# Patient Record
Sex: Female | Born: 1960
Health system: Southern US, Community
[De-identification: ages and names within clinical notes are randomized; demographics above are authoritative.]

## PROBLEM LIST (undated history)

## (undated) DIAGNOSIS — T1491XA Suicide attempt, initial encounter: Secondary | ICD-10-CM

## (undated) DIAGNOSIS — F319 Bipolar disorder, unspecified: Secondary | ICD-10-CM

## (undated) DIAGNOSIS — E559 Vitamin D deficiency, unspecified: Secondary | ICD-10-CM

## (undated) DIAGNOSIS — I10 Essential (primary) hypertension: Secondary | ICD-10-CM

## (undated) DIAGNOSIS — X828XXA Other intentional self-harm by crashing of motor vehicle, initial encounter: Secondary | ICD-10-CM

## (undated) DIAGNOSIS — E88819 Insulin resistance, unspecified: Secondary | ICD-10-CM

## (undated) DIAGNOSIS — E8881 Metabolic syndrome: Secondary | ICD-10-CM

## (undated) DIAGNOSIS — K649 Unspecified hemorrhoids: Secondary | ICD-10-CM

## (undated) HISTORY — DX: Insulin resistance, unspecified: E88.819

## (undated) HISTORY — DX: Essential (primary) hypertension: I10

## (undated) HISTORY — DX: Bipolar disorder, unspecified: F31.9

## (undated) HISTORY — DX: Unspecified hemorrhoids: K64.9

## (undated) HISTORY — DX: Vitamin D deficiency, unspecified: E55.9

## (undated) HISTORY — PX: TONSILLECTOMY AND ADENOIDECTOMY: SUR1326

## (undated) HISTORY — DX: Suicide attempt, initial encounter: T14.91XA

## (undated) HISTORY — DX: Other intentional self-harm by crashing of motor vehicle, initial encounter: X82.8XXA

## (undated) HISTORY — DX: Metabolic syndrome: E88.81

---

## 2005-07-02 ENCOUNTER — Ambulatory Visit: Payer: Self-pay

## 2006-07-11 ENCOUNTER — Other Ambulatory Visit: Admission: RE | Admit: 2006-07-11 | Discharge: 2006-07-11 | Payer: Self-pay | Admitting: Internal Medicine

## 2007-04-13 ENCOUNTER — Ambulatory Visit (HOSPITAL_COMMUNITY): Admission: RE | Admit: 2007-04-13 | Discharge: 2007-04-13 | Payer: Self-pay | Admitting: Internal Medicine

## 2008-04-26 ENCOUNTER — Ambulatory Visit (HOSPITAL_COMMUNITY): Admission: RE | Admit: 2008-04-26 | Discharge: 2008-04-26 | Payer: Self-pay | Admitting: Internal Medicine

## 2008-08-01 ENCOUNTER — Other Ambulatory Visit: Admission: RE | Admit: 2008-08-01 | Discharge: 2008-08-01 | Payer: Self-pay | Admitting: Internal Medicine

## 2009-07-04 ENCOUNTER — Ambulatory Visit (HOSPITAL_COMMUNITY): Admission: RE | Admit: 2009-07-04 | Discharge: 2009-07-04 | Payer: Self-pay | Admitting: Internal Medicine

## 2010-07-27 ENCOUNTER — Other Ambulatory Visit (HOSPITAL_COMMUNITY): Payer: Self-pay | Admitting: Internal Medicine

## 2010-07-27 DIAGNOSIS — Z1231 Encounter for screening mammogram for malignant neoplasm of breast: Secondary | ICD-10-CM

## 2010-08-11 ENCOUNTER — Ambulatory Visit (HOSPITAL_COMMUNITY)
Admission: RE | Admit: 2010-08-11 | Discharge: 2010-08-11 | Disposition: A | Discharge status: Home / Self Care | Payer: BC Managed Care – PPO | Source: Ambulatory Visit | Attending: Internal Medicine | Admitting: Internal Medicine

## 2010-08-11 DIAGNOSIS — Z1231 Encounter for screening mammogram for malignant neoplasm of breast: Secondary | ICD-10-CM

## 2011-07-06 ENCOUNTER — Other Ambulatory Visit (HOSPITAL_COMMUNITY): Payer: Self-pay | Admitting: Internal Medicine

## 2011-07-06 DIAGNOSIS — Z1231 Encounter for screening mammogram for malignant neoplasm of breast: Secondary | ICD-10-CM

## 2011-08-13 ENCOUNTER — Ambulatory Visit (HOSPITAL_COMMUNITY)
Admission: RE | Admit: 2011-08-13 | Discharge: 2011-08-13 | Disposition: A | Discharge status: Home / Self Care | Payer: BC Managed Care – PPO | Source: Ambulatory Visit | Attending: Internal Medicine | Admitting: Internal Medicine

## 2011-08-13 DIAGNOSIS — Z1231 Encounter for screening mammogram for malignant neoplasm of breast: Secondary | ICD-10-CM | POA: Insufficient documentation

## 2011-10-20 ENCOUNTER — Other Ambulatory Visit (HOSPITAL_COMMUNITY)
Admission: RE | Admit: 2011-10-20 | Discharge: 2011-10-20 | Disposition: A | Discharge status: Home / Self Care | Payer: BC Managed Care – PPO | Source: Ambulatory Visit | Attending: Internal Medicine | Admitting: Internal Medicine

## 2011-10-20 DIAGNOSIS — Z1151 Encounter for screening for human papillomavirus (HPV): Secondary | ICD-10-CM | POA: Insufficient documentation

## 2011-10-20 DIAGNOSIS — N760 Acute vaginitis: Secondary | ICD-10-CM | POA: Insufficient documentation

## 2011-10-20 DIAGNOSIS — Z01419 Encounter for gynecological examination (general) (routine) without abnormal findings: Secondary | ICD-10-CM | POA: Insufficient documentation

## 2011-10-28 ENCOUNTER — Encounter: Payer: Self-pay | Admitting: Gastroenterology

## 2011-11-01 ENCOUNTER — Ambulatory Visit: Payer: BC Managed Care – PPO | Admitting: Gastroenterology

## 2011-12-01 ENCOUNTER — Ambulatory Visit: Payer: BC Managed Care – PPO | Admitting: Gastroenterology

## 2011-12-21 ENCOUNTER — Ambulatory Visit: Payer: BC Managed Care – PPO | Admitting: Gastroenterology

## 2012-01-24 ENCOUNTER — Encounter: Payer: Self-pay | Admitting: Gastroenterology

## 2012-01-28 ENCOUNTER — Ambulatory Visit: Payer: BC Managed Care – PPO | Admitting: Gastroenterology

## 2012-03-01 ENCOUNTER — Ambulatory Visit: Payer: BC Managed Care – PPO | Admitting: Gastroenterology

## 2012-08-17 ENCOUNTER — Other Ambulatory Visit (HOSPITAL_COMMUNITY): Payer: Self-pay | Admitting: Physician Assistant

## 2012-08-17 DIAGNOSIS — Z1231 Encounter for screening mammogram for malignant neoplasm of breast: Secondary | ICD-10-CM

## 2012-09-05 ENCOUNTER — Ambulatory Visit (HOSPITAL_COMMUNITY)
Admission: RE | Admit: 2012-09-05 | Discharge: 2012-09-05 | Disposition: A | Discharge status: Home / Self Care | Payer: BC Managed Care – PPO | Source: Ambulatory Visit | Attending: Physician Assistant | Admitting: Physician Assistant

## 2012-09-05 DIAGNOSIS — Z1231 Encounter for screening mammogram for malignant neoplasm of breast: Secondary | ICD-10-CM | POA: Insufficient documentation

## 2013-01-04 ENCOUNTER — Telehealth: Payer: Self-pay | Admitting: Internal Medicine

## 2013-01-04 NOTE — Telephone Encounter (Signed)
[phone] called about an expired rx dated 10-20-11 for lisinopril 10hctz 12.5, please advise pharm if you will approve rx

## 2013-01-04 NOTE — Telephone Encounter (Signed)
Please get me the chart or let me know the last visit. Patient does not have any future appointment in epic. 

## 2013-01-05 MED ORDER — LISINOPRIL-HYDROCHLOROTHIAZIDE 10-12.5 MG PO TABS: 1.0000 | ORAL_TABLET | Freq: Every day | ORAL | Status: DC

## 2013-01-05 NOTE — Telephone Encounter (Signed)
LMOM RX was sent to pharmacy 

## 2013-04-18 ENCOUNTER — Other Ambulatory Visit: Payer: Self-pay | Admitting: Physician Assistant

## 2013-04-18 NOTE — Telephone Encounter (Signed)
Patient needs ov before additional refills

## 2013-05-29 DIAGNOSIS — I10 Essential (primary) hypertension: Secondary | ICD-10-CM | POA: Insufficient documentation

## 2013-05-29 DIAGNOSIS — E8881 Metabolic syndrome: Secondary | ICD-10-CM | POA: Insufficient documentation

## 2013-05-29 DIAGNOSIS — F319 Bipolar disorder, unspecified: Secondary | ICD-10-CM | POA: Insufficient documentation

## 2013-05-29 DIAGNOSIS — E559 Vitamin D deficiency, unspecified: Secondary | ICD-10-CM | POA: Insufficient documentation

## 2013-05-30 ENCOUNTER — Ambulatory Visit (INDEPENDENT_AMBULATORY_CARE_PROVIDER_SITE_OTHER): Payer: BC Managed Care – PPO | Admitting: Physician Assistant

## 2013-05-30 ENCOUNTER — Encounter: Payer: Self-pay | Admitting: Physician Assistant

## 2013-05-30 VITALS — BP 118/82 | HR 68 | Temp 98.2°F | Resp 16 | Wt 195.8 lb

## 2013-05-30 DIAGNOSIS — Z79899 Other long term (current) drug therapy: Secondary | ICD-10-CM

## 2013-05-30 DIAGNOSIS — E559 Vitamin D deficiency, unspecified: Secondary | ICD-10-CM

## 2013-05-30 DIAGNOSIS — E8881 Metabolic syndrome: Secondary | ICD-10-CM

## 2013-05-30 DIAGNOSIS — F319 Bipolar disorder, unspecified: Secondary | ICD-10-CM

## 2013-05-30 DIAGNOSIS — I10 Essential (primary) hypertension: Secondary | ICD-10-CM

## 2013-05-30 LAB — CBC WITH DIFFERENTIAL/PLATELET
Basophils Absolute: 0.1 10*3/uL (ref 0.0–0.1)
Basophils Relative: 1 % (ref 0–1)
EOS PCT: 6 % — AB (ref 0–5)
Eosinophils Absolute: 0.4 10*3/uL (ref 0.0–0.7)
HCT: 35.4 % — ABNORMAL LOW (ref 36.0–46.0)
Hemoglobin: 12.4 g/dL (ref 12.0–15.0)
LYMPHS PCT: 32 % (ref 12–46)
Lymphs Abs: 2.3 10*3/uL (ref 0.7–4.0)
MCH: 29 pg (ref 26.0–34.0)
MCHC: 35 g/dL (ref 30.0–36.0)
MCV: 82.7 fL (ref 78.0–100.0)
MONO ABS: 0.4 10*3/uL (ref 0.1–1.0)
Monocytes Relative: 6 % (ref 3–12)
Neutro Abs: 4 10*3/uL (ref 1.7–7.7)
Neutrophils Relative %: 55 % (ref 43–77)
Platelets: 233 10*3/uL (ref 150–400)
RBC: 4.28 MIL/uL (ref 3.87–5.11)
RDW: 14.7 % (ref 11.5–15.5)
WBC: 7.3 10*3/uL (ref 4.0–10.5)

## 2013-05-30 LAB — HEMOGLOBIN A1C
HEMOGLOBIN A1C: 5.4 % (ref <5.7)
MEAN PLASMA GLUCOSE: 108 mg/dL (ref <117)

## 2013-05-30 MED ORDER — LISINOPRIL-HYDROCHLOROTHIAZIDE 10-12.5 MG PO TABS: 1.0000 | ORAL_TABLET | Freq: Every day | ORAL | Status: DC

## 2013-05-30 NOTE — Patient Instructions (Signed)
Bad carbs also include fruit juice, alcohol, and sweet tea. These are empty calories that do not signal to your brain that you are full.   Please remember the good carbs are still carbs which convert into sugar. So please measure them out no more than 1/2-1 cup of rice, oatmeal, pasta, and beans.  Veggies are however free foods! Pile them on.   I like lean protein at every meal such as chicken, [place]key, pork chops, cottage cheese, etc. Just do not fry these meats and please center your meal around vegetable, the meats should be a side dish.   No all fruit is created equal. Please see the list below, the fruit at the bottom is higher in sugars than the fruit at the top    Carpal Tunnel Syndrome The carpal tunnel is a narrow area located on the palm side of your wrist. The tunnel is formed by the wrist bones and ligaments. Nerves, blood vessels, and tendons pass through the carpal tunnel. Repeated wrist motion or certain diseases may cause swelling within the tunnel. This swelling pinches the main nerve in the wrist (median nerve) and causes the painful hand and arm condition called carpal tunnel syndrome. CAUSES   Repeated wrist motions.  Wrist injuries.  Certain diseases like arthritis, diabetes, alcoholism, hyperthyroidism, and kidney failure.  Obesity.  Pregnancy. SYMPTOMS   A "pins and needles" feeling in your fingers or hand.  Tingling or numbness in your fingers or hand.  An aching feeling in your entire arm.  Wrist pain that goes up your arm to your shoulder.  Pain that goes down into your palm or fingers.  A weak feeling in your hands. DIAGNOSIS  Your caregiver will take your history and perform a physical exam. An electromyography test may be needed. This test measures electrical signals sent out by the muscles. The electrical signals are usually slowed by carpal tunnel syndrome. You may also need X-rays. TREATMENT  Carpal tunnel syndrome may clear up by itself. Your  caregiver may recommend a wrist splint or medicine such as a nonsteroidal anti-inflammatory medicine. Cortisone injections may help. Sometimes, surgery may be needed to free the pinched nerve.  HOME CARE INSTRUCTIONS   Take all medicine as directed by your caregiver. Only take over-the-counter or prescription medicines for pain, discomfort, or fever as directed by your caregiver.  If you were given a splint to keep your wrist from bending, wear it as directed. It is important to wear the splint at night. Wear the splint for as long as you have pain or numbness in your hand, arm, or wrist. This may take 1 to 2 months.  Rest your wrist from any activity that may be causing your pain. If your symptoms are work-related, you may need to talk to your employer about changing to a job that does not require using your wrist.  Put ice on your wrist after long periods of wrist activity.  Put ice in a plastic bag.  Place a towel between your skin and the bag.  Leave the ice on for 15-20 minutes, 03-04 times a day.  Keep all follow-up visits as directed by your caregiver. This includes any orthopedic referrals, physical therapy, and rehabilitation. Any delay in getting necessary care could result in a delay or failure of your condition to heal. SEEK IMMEDIATE MEDICAL CARE IF:   You have new, unexplained symptoms.  Your symptoms get worse and are not helped or controlled with medicines. MAKE SURE YOU:  Understand these instructions.  Will watch your condition.  Will get help right away if you are not doing well or get worse. Document Released: 01/16/2000 Document Revised: 04/12/2011 Document Reviewed: 12/04/2010 [facility] Patient Information 2014 [employer] [place].

## 2013-05-30 NOTE — Progress Notes (Signed)
HPI 53 y.o. female  presents for 3 month follow up with hypertension, hyperlipidemia, prediabetes and vitamin D. Her blood pressure has been controlled at home, today their BP is BP: 118/82 mmHg She does not workout. She denies chest pain, shortness of breath, dizziness.  She is not on cholesterol medication and denies myalgias. Her cholesterol is\ at goal. The cholesterol last visit was: LDL 94  She has been working on diet and exercise for prediabetes, and denies paresthesia of the feet, polydipsia and polyuria. Last A1C in the office was: insulin 44, A1C 5.4 Patient is on Vitamin D supplement.  Vitamin D 63 She states that her wrist pain and the numbness is getting worse. Worse in the AM and worse occ with driving. She also has bilateral elbow pain in the AM.   Current Medications:  Current Outpatient Prescriptions on File Prior to Visit  Medication Sig Dispense Refill  . BABY ASPIRIN PO Take 81 mg by mouth daily.      . Calcium Carbonate (CALCIUM 600 PO) Take 600 mg by mouth daily.      . cholecalciferol (VITAMIN D) 1000 UNITS tablet Take 8,000 Units by mouth daily.       Marland Kitchen. lisinopril-hydrochlorothiazide (PRINZIDE,ZESTORETIC) 10-12.5 MG per tablet TAKE ONE TABLET BY MOUTH ONCE DAILY  30 tablet  0  . lithium 300 MG tablet Take 300 mg by mouth 3 (three) times daily.      . Omega-3 Fatty Acids (FISH OIL PO) Take by mouth daily.       No current facility-administered medications on file prior to visit.   Medical History:  Past Medical History  Diagnosis Date  . Bipolar disorder   . Hemorrhoids   . Hypertension   . Vitamin D deficiency   . Insulin resistance    Allergies:  Allergies  Allergen Reactions  . Prednisolone      Review of Systems: [X]  = complains of  [ ]  = denies  General: Fatigue [ ]  Fever [ ]  Chills [ ]  Weakness [ ]   Insomnia [ ]  Eyes: Redness [ ]  Blurred vision [ ]  Diplopia [ ]   ENT: Congestion [ ]  Sinus Pain [ ]  Post Nasal Drip [ ]  Sore Throat [ ]  Earache [ ]    Cardiac: Chest pain/pressure [ ]  SOB [ ]  Orthopnea [ ]   Palpitations [ ]   Paroxysmal nocturnal dyspnea[ ]  Claudication [ ]  Edema [ ]   Pulmonary: Cough [ ]  Wheezing[ ]   SOB [ ]   Snoring [ ]   GI: Nausea [ ]  Vomiting[ ]  Dysphagia[ ]  Heartburn[ ]  Abdominal pain [ ]  Constipation [ ] ; Diarrhea [ ] ; BRBPR [ ]  Melena[ ]  GU: Hematuria[ ]  Dysuria [ ]  Nocturia[ ]  Urgency [ ]   Hesitancy [ ]  Discharge [ ]  Neuro: Headaches[ ]  Vertigo[ ]  Paresthesias[ ]  Spasm [ ]  Speech changes [ ]  Incoordination [ ]   Ortho: Arthritis [ ]  Joint pain [ ]  Muscle pain [ ]  Joint swelling [ ]  Back Pain [ ]  Skin:  Rash [ ]   Pruritis [ ]  Change in skin lesion [ ]   Psych: Depression[ ]  Anxiety[ ]  Confusion [ ]  Memory loss [ ]   Heme/Lypmh: Bleeding [ ]  Bruising [ ]  Enlarged lymph nodes [ ]   Endocrine: Visual blurring [ ]  Paresthesia [ ]  Polyuria [ ]  Polydypsea [ ]    Heat/cold intolerance [ ]  Hypoglycemia [ ]   Family history- Review and unchanged Social history- Review and unchanged Physical Exam: BP 118/82  Pulse 68  Temp(Src) 98.2 F (36.8 C)  Resp 16  Wt 195 lb 12.8 oz (88.814 kg) Wt Readings from Last 3 Encounters:  05/30/13 195 lb 12.8 oz (88.814 kg)   General Appearance: Well nourished, in no apparent distress. Eyes: PERRLA, EOMs, conjunctiva no swelling or erythema Sinuses: No Frontal/maxillary tenderness ENT/Mouth: Ext aud canals clear, TMs without erythema, bulging. No erythema, swelling, or exudate on post pharynx.  Tonsils not swollen or erythematous. Hearing normal.  Neck: Supple, thyroid normal.  Respiratory: Respiratory effort normal, BS equal bilaterally without rales, rhonchi, wheezing or stridor.  Cardio: RRR with no MRGs. Brisk peripheral pulses without edema.  Abdomen: Soft, + BS.  Non tender, no guarding, rebound, hernias, masses. Lymphatics: Non tender without lymphadenopathy.  Musculoskeletal: Full ROM, 5/5 strength, normal gait. No cervical neck tenderness, good strength and sensation bilateral arms,  good pulses. Skin: Warm, dry without rashes, lesions, ecchymosis.  Neuro: Cranial nerves intact. Normal muscle tone, no cerebellar symptoms. Sensation intact.  Psych: Awake and oriented X 3, normal affect, Insight and Judgment appropriate.   Assessment and Plan:  Hypertension: Continue medication, monitor blood pressure at home. Continue DASH diet. Cholesterol: Continue diet and exercise. Check cholesterol.  Pre-diabetes-Continue diet and exercise. Check A1C Vitamin D Def- check level and continue medications.  ?cervical versus carpel tunnel- get brace and wear at night for 3 weeks and also get cervical neck pillow, if not better will refer to ortho  Continue diet and meds as discussed. Further disposition pending results of labs.  Quentin MullingAmanda Kyshon Tolliver 1:40 PM

## 2013-05-31 LAB — BASIC METABOLIC PANEL WITH GFR
BUN: 19 mg/dL (ref 6–23)
CALCIUM: 9.7 mg/dL (ref 8.4–10.5)
CO2: 26 mEq/L (ref 19–32)
CREATININE: 0.96 mg/dL (ref 0.50–1.10)
Chloride: 103 mEq/L (ref 96–112)
GFR, Est African American: 80 mL/min
GFR, Est Non African American: 69 mL/min
Glucose, Bld: 86 mg/dL (ref 70–99)
POTASSIUM: 4.3 meq/L (ref 3.5–5.3)
Sodium: 137 mEq/L (ref 135–145)

## 2013-05-31 LAB — VITAMIN D 25 HYDROXY (VIT D DEFICIENCY, FRACTURES): Vit D, 25-Hydroxy: 66 ng/mL (ref 30–89)

## 2013-05-31 LAB — INSULIN, FASTING: INSULIN FASTING, SERUM: 31 u[IU]/mL — AB (ref 3–28)

## 2013-05-31 LAB — TSH: TSH: 1.88 u[IU]/mL (ref 0.350–4.500)

## 2013-05-31 LAB — HEPATIC FUNCTION PANEL
ALBUMIN: 4.4 g/dL (ref 3.5–5.2)
ALT: 14 U/L (ref 0–35)
AST: 14 U/L (ref 0–37)
Alkaline Phosphatase: 50 U/L (ref 39–117)
BILIRUBIN DIRECT: 0.1 mg/dL (ref 0.0–0.3)
Indirect Bilirubin: 0.4 mg/dL (ref 0.2–1.2)
TOTAL PROTEIN: 6.7 g/dL (ref 6.0–8.3)
Total Bilirubin: 0.5 mg/dL (ref 0.2–1.2)

## 2013-05-31 LAB — MAGNESIUM: MAGNESIUM: 1.9 mg/dL (ref 1.5–2.5)

## 2013-06-07 ENCOUNTER — Telehealth: Payer: Self-pay | Admitting: *Deleted

## 2013-06-07 ENCOUNTER — Encounter: Payer: Self-pay | Admitting: Physician Assistant

## 2013-06-07 MED ORDER — FLUTICASONE PROPIONATE 50 MCG/ACT NA SUSP: 2.0000 | Freq: Every day | NASAL | Status: DC

## 2013-06-07 NOTE — Telephone Encounter (Signed)
PROPIONATE  Pt asking is this ok for her to use? Says its her husbands & wanting to use med?

## 2013-06-18 ENCOUNTER — Other Ambulatory Visit: Payer: Self-pay | Admitting: Physician Assistant

## 2013-06-18 MED ORDER — BISOPROLOL-HYDROCHLOROTHIAZIDE 5-6.25 MG PO TABS: 1.0000 | ORAL_TABLET | Freq: Every day | ORAL | Status: DC

## 2013-08-13 ENCOUNTER — Other Ambulatory Visit: Payer: Self-pay | Admitting: Internal Medicine

## 2013-08-13 DIAGNOSIS — Z1231 Encounter for screening mammogram for malignant neoplasm of breast: Secondary | ICD-10-CM

## 2013-09-13 ENCOUNTER — Ambulatory Visit (HOSPITAL_COMMUNITY)
Admission: RE | Admit: 2013-09-13 | Discharge: 2013-09-13 | Disposition: A | Discharge status: Home / Self Care | Payer: BC Managed Care – PPO | Source: Ambulatory Visit | Attending: Internal Medicine | Admitting: Internal Medicine

## 2013-09-13 DIAGNOSIS — Z1231 Encounter for screening mammogram for malignant neoplasm of breast: Secondary | ICD-10-CM | POA: Diagnosis present

## 2013-10-29 ENCOUNTER — Encounter: Payer: Self-pay | Admitting: Internal Medicine

## 2013-11-03 ENCOUNTER — Other Ambulatory Visit: Payer: Self-pay | Admitting: Physician Assistant

## 2013-11-03 ENCOUNTER — Other Ambulatory Visit: Payer: Self-pay | Admitting: Internal Medicine

## 2013-11-03 MED ORDER — LISINOPRIL-HYDROCHLOROTHIAZIDE 10-12.5 MG PO TABS: 1.0000 | ORAL_TABLET | Freq: Every day | ORAL | Status: DC

## 2013-11-16 ENCOUNTER — Other Ambulatory Visit: Payer: Self-pay

## 2013-12-10 ENCOUNTER — Encounter: Payer: Self-pay | Admitting: Internal Medicine

## 2013-12-10 ENCOUNTER — Ambulatory Visit (INDEPENDENT_AMBULATORY_CARE_PROVIDER_SITE_OTHER): Payer: BC Managed Care – PPO | Admitting: Internal Medicine

## 2013-12-10 VITALS — BP 112/74 | HR 76 | Temp 98.2°F | Resp 16 | Ht 67.25 in | Wt 194.4 lb

## 2013-12-10 DIAGNOSIS — R6889 Other general symptoms and signs: Secondary | ICD-10-CM

## 2013-12-10 DIAGNOSIS — E8881 Metabolic syndrome: Secondary | ICD-10-CM

## 2013-12-10 DIAGNOSIS — E559 Vitamin D deficiency, unspecified: Secondary | ICD-10-CM

## 2013-12-10 DIAGNOSIS — E782 Mixed hyperlipidemia: Secondary | ICD-10-CM | POA: Insufficient documentation

## 2013-12-10 DIAGNOSIS — R7989 Other specified abnormal findings of blood chemistry: Secondary | ICD-10-CM

## 2013-12-10 DIAGNOSIS — Z79899 Other long term (current) drug therapy: Secondary | ICD-10-CM | POA: Insufficient documentation

## 2013-12-10 DIAGNOSIS — Z23 Encounter for immunization: Secondary | ICD-10-CM

## 2013-12-10 DIAGNOSIS — Z113 Encounter for screening for infections with a predominantly sexual mode of transmission: Secondary | ICD-10-CM

## 2013-12-10 DIAGNOSIS — Z0001 Encounter for general adult medical examination with abnormal findings: Secondary | ICD-10-CM

## 2013-12-10 DIAGNOSIS — Z111 Encounter for screening for respiratory tuberculosis: Secondary | ICD-10-CM

## 2013-12-10 DIAGNOSIS — I10 Essential (primary) hypertension: Secondary | ICD-10-CM

## 2013-12-10 DIAGNOSIS — Z1212 Encounter for screening for malignant neoplasm of rectum: Secondary | ICD-10-CM

## 2013-12-10 DIAGNOSIS — R945 Abnormal results of liver function studies: Secondary | ICD-10-CM

## 2013-12-10 LAB — CBC WITH DIFFERENTIAL/PLATELET
Basophils Absolute: 0.1 10*3/uL (ref 0.0–0.1)
Basophils Relative: 1 % (ref 0–1)
EOS ABS: 0.4 10*3/uL (ref 0.0–0.7)
EOS PCT: 7 % — AB (ref 0–5)
HEMATOCRIT: 37.2 % (ref 36.0–46.0)
HEMOGLOBIN: 12.3 g/dL (ref 12.0–15.0)
LYMPHS ABS: 1.4 10*3/uL (ref 0.7–4.0)
LYMPHS PCT: 26 % (ref 12–46)
MCH: 27.8 pg (ref 26.0–34.0)
MCHC: 33.1 g/dL (ref 30.0–36.0)
MCV: 84 fL (ref 78.0–100.0)
MONO ABS: 0.4 10*3/uL (ref 0.1–1.0)
MONOS PCT: 7 % (ref 3–12)
Neutro Abs: 3.2 10*3/uL (ref 1.7–7.7)
Neutrophils Relative %: 59 % (ref 43–77)
Platelets: 207 10*3/uL (ref 150–400)
RBC: 4.43 MIL/uL (ref 3.87–5.11)
RDW: 14.4 % (ref 11.5–15.5)
WBC: 5.4 10*3/uL (ref 4.0–10.5)

## 2013-12-10 LAB — HEMOGLOBIN A1C
Hgb A1c MFr Bld: 5.3 % (ref <5.7)
Mean Plasma Glucose: 105 mg/dL (ref <117)

## 2013-12-10 NOTE — Progress Notes (Signed)
Patient ID: Tanya Tucker, female   DOB: 12-21-1960, 53 y.o.   MRN: [id number]  Annual Screening Comprehensive Examination  This very nice 52 y.o.MWF presents for complete physical.  Patient has been followed for HTN,  Prediabetes, Hyperlipidemia, and Vitamin D Deficiency. Patienty has a bipolar disorder predating since 1994 and [facility] Dr Tomasa Randunningham and apparently is stable on current medicine regimen.    Patient has mild labile HTN predates since 2010 and take her meds infrequently. Patient's BP has been controlled at home and patient denies any cardiac symptoms as chest pain, palpitations, shortness of breath, dizziness or ankle swelling. Today's BP: 112/74 mmHg    Patient's hyperlipidemia is controlled with diet. Patient denies myalgias or other medication SE's. Last lipids were at goal -  T. Chol 174,  HDL 48 and LDL 94 all at goal except a slightly elevatedTG 160.    Patient has  Prediabetes/Insulin Resistance  predating since Sept 2014 with a normal A1c 5.4% and elevated insulin 42.    Patient denies reactive hypoglycemic symptoms, visual blurring, diabetic polys, or paresthesias. Last A1c was  5.4% on 05/30/2013.   Finally, patient has history of Vitamin D Deficiency (19 in 2010) and last Vitamin D was  66 on 05/30/2013.  Medication Sig  . BABY ASPIRIN  Take 81 mg  daily.  . bisoprolol-hctz 5-6.25  Take 1 tablet  daily.  . Calcium Carbonate  600 mg Take 600 mg  daily.  Marland Kitchen. VITAMIN D 1000 UNITS Take 8,000 Units  daily.   . fluticasone nasal spray Place 2 sprays into both nostrils daily.  Marland Kitchen. lisinopril-hctz 10-12.5 MG   take 1 tablet daily for  BP (takes prn)  . lithium 300 MG tablet Take 300 mg 3  times daily.  Marland Kitchen. FISH OIL PO Take  daily.   Allergies  Allergen Reactions  . Prednisolone    Past Medical History  Diagnosis Date  . Bipolar disorder   . Hemorrhoids   . Hypertension   . Vitamin D deficiency   . Insulin resistance    Health  Maintenance  Topic Date Due  . PAP SMEAR  12/18/1980  . COLONOSCOPY  12/18/2012  . INFLUENZA VACCINE  09/01/2013  . MAMMOGRAM  09/14/2015  . TETANUS/TDAP  07/18/2017   Immunization History  Administered Date(s) Administered  . Influenza Split 10/24/2012, 12/10/2013  . PPD Test 12/10/2013  . Pneumococcal Polysaccharide-23 10/24/2012  . Tdap 07/19/2007   Past Surgical History  Procedure Laterality Date  . Tonsillectomy and adenoidectomy     Family History  Problem Relation Age of Onset  . Cataracts Mother   . Osteoporosis Mother   . Heart disease Father   . Hypertension Father   . Diabetes Father    History  Substance Use Topics  . Smoking status: Never Smoker   . Smokeless tobacco: Not on file  . Alcohol Use: Yes     Comment: "a few times a week"    ROS Constitutional: Denies fever, chills, weight loss/gain, headaches, insomnia, fatigue, night sweats, and change in appetite. Eyes: Denies redness, blurred vision, diplopia, discharge, itchy, watery eyes.  ENT: Denies discharge, congestion, post nasal drip, epistaxis, sore throat, earache, hearing loss, dental pain, Tinnitus, Vertigo, Sinus pain, snoring.  Cardio: Denies chest pain, palpitations, irregular heartbeat, syncope, dyspnea, diaphoresis, orthopnea, PND, claudication, edema Respiratory: denies cough, dyspnea, DOE, pleurisy, hoarseness, laryngitis, wheezing.  Gastrointestinal: Denies dysphagia, heartburn, reflux, water brash, pain, cramps, nausea, vomiting, bloating, diarrhea, constipation, hematemesis,  melena, hematochezia, jaundice, hemorrhoids Genitourinary: Denies dysuria, frequency, urgency, nocturia, hesitancy, discharge, hematuria, flank pain Breast: Breast lumps, nipple discharge, bleeding.  Musculoskeletal: Denies arthralgia, myalgia, stiffness, Jt. Swelling, pain, limp, and strain/sprain. Denies falls. Skin: Denies puritis, rash, hives, warts, acne, eczema, changing in skin lesion Neuro: No weakness, tremor,  incoordination, spasms, paresthesia, pain Psychiatric: Denies confusion, memory loss, sensory loss. Denies Depression. Endocrine: Denies change in weight, skin, hair change, nocturia, and paresthesia, diabetic polys, visual blurring, hyper / hypo glycemic episodes.  Heme/Lymph: No excessive bleeding, bruising, enlarged lymph nodes.  Physical Exam  BP 112/74   Pulse 76  Temp 98.2 F   Resp 16  Ht 5' 7.25" Wt 194 lb 6.4 oz   BMI 30.23  General Appearance: Well nourished and in no apparent distress. Eyes: PERRLA, EOMs, conjunctiva no swelling or erythema, normal fundi and vessels. Sinuses: No frontal/maxillary tenderness ENT/Mouth: EACs patent / TMs  nl. Nares clear without erythema, swelling, mucoid exudates. Oral hygiene is good. No erythema, swelling, or exudate. Tongue normal, non-obstructing. Tonsils not swollen or erythematous. Hearing normal.  Neck: Supple, thyroid normal. No bruits, nodes or JVD. Respiratory: Respiratory effort normal.  BS equal and clear bilateral without rales, rhonci, wheezing or stridor. Cardio: Heart sounds are normal with regular rate and rhythm and no murmurs, rubs or gallops. Peripheral pulses are normal and equal bilaterally without edema. No aortic or femoral bruits. Chest: symmetric with normal excursions and percussion. Breasts: Symmetric, without lumps, nipple discharge, retractions, or fibrocystic changes.  Abdomen: Flat, soft, with bowl sounds. Nontender, no guarding, rebound, hernias, masses, or organomegaly.  Lymphatics: Non tender without lymphadenopathy.  Genitourinary:  Musculoskeletal: Full ROM all peripheral extremities, joint stability, 5/5 strength, and normal gait. Skin: Warm and dry without rashes, lesions, cyanosis, clubbing or  ecchymosis.  Neuro: Cranial nerves intact, reflexes equal bilaterally. Normal muscle tone, no cerebellar symptoms. Sensation intact.  Pysch: Awake and oriented X 3, normal affect, Insight and Judgment appropriate.    Assessment and Plan  1. Annual Screening Examination 2. Hypertension  3. Hyperlipidemia 4. Pre Diabetes 5. Vitamin D Deficiency   Continue prudent diet as discussed, weight control, BP monitoring, regular exercise, and medications. Discussed med's effects and SE's. Screening labs and tests as requested with regular follow-up as recommended.

## 2013-12-10 NOTE — Patient Instructions (Signed)
Recommend the book "The END of DIETING" by Dr Baker Janus   and the book "The END of DIABETES " by Dr Excell Seltzer  At [email or web address] - get book & Audio CD's      Being diabetic has a  300% increased risk for heart attack, stroke, cancer, and alzheimer- type vascular dementia. It is very important that you work harder with diet by avoiding all foods that are white except chicken & fish. Avoid white rice (brown & wild rice is OK), white potatoes (sweetpotatoes in moderation is OK), White bread or wheat bread or anything made out of white flour like bagels, donuts, rolls, buns, biscuits, cakes, pastries, cookies, pizza crust, and pasta (made from white flour & egg whites) - vegetarian pasta or spinach or wheat pasta is OK. Multigrain breads like Arnold's or [employer], or multigrain sandwich thins or flatbreads.  Diet, exercise and weight loss can reverse and cure diabetes in the early stages.  Diet, exercise and weight loss is very important in the control and prevention of complications of diabetes which affects every system in your body, ie. Brain - dementia/stroke, eyes - glaucoma/blindness, heart - heart attack/heart failure, kidneys - dialysis, stomach - gastric paralysis, intestines - malabsorption, nerves - severe painful neuritis, circulation - gangrene & loss of a leg(s), and finally cancer and Alzheimers.    I recommend avoid fried & greasy foods,  sweets/candy, white rice (brown or wild rice or Quinoa is OK), white potatoes (sweet potatoes are OK) - anything made from white flour - bagels, doughnuts, rolls, buns, biscuits,white and wheat breads, pizza crust and traditional pasta made of white flour & egg white(vegetarian pasta or spinach or wheat pasta is OK).  Multi-grain bread is OK - like multi-grain flat bread or sandwich thins. Avoid alcohol in excess. Exercise is also important.    Eat all the vegetables you want - avoid meat, especially red meat and dairy - especially cheese.  Cheese  is the most concentrated form of trans-fats which is the worst thing to clog up our arteries. Veggie cheese is OK which can be found in the fresh produce section at [employer] or [employer] or [employer]  Preventive Care for Adults A healthy lifestyle and preventive care can promote health and wellness. Preventive health guidelines for women include the following key practices.  A routine yearly physical is a good way to check with your health care provider about your health and preventive screening. It is a chance to share any concerns and updates on your health and to receive a thorough exam.  Visit your dentist for a routine exam and preventive care every 6 months. Brush your teeth twice a day and floss once a day. Good oral hygiene prevents tooth decay and gum disease.  The frequency of eye exams is based on your age, health, family medical history, use of contact lenses, and other factors. Follow your health care provider's recommendations for frequency of eye exams.  Eat a healthy diet. Foods like vegetables, fruits, whole grains, low-fat dairy products, and lean protein foods contain the nutrients you need without too many calories. Decrease your intake of foods high in solid fats, added sugars, and salt. Eat the right amount of calories for you.Get information about a proper diet from your health care provider, if necessary.  Regular physical exercise is one of the most important things you can do for your health. Most adults should get at least 150 minutes of moderate-intensity exercise (any activity that increases  your heart rate and causes you to sweat) each week. In addition, most adults need muscle-strengthening exercises on 2 or more days a week.  Maintain a healthy weight. The body mass index (BMI) is a screening tool to identify possible weight problems. It provides an estimate of body fat based on height and weight. Your health care provider can find your BMI and can help you  achieve or maintain a healthy weight.For adults 20 years and older:  A BMI below 18.5 is considered underweight.  A BMI of 18.5 to 24.9 is normal.  A BMI of 25 to 29.9 is considered overweight.  A BMI of 30 and above is considered obese.  Maintain normal blood lipids and cholesterol levels by exercising and minimizing your intake of saturated fat. Eat a balanced diet with plenty of fruit and vegetables. Blood tests for lipids and cholesterol should begin at age 38 and be repeated every 5 years. If your lipid or cholesterol levels are high, you are over 50, or you are at high risk for heart disease, you may need your cholesterol levels checked more frequently.Ongoing high lipid and cholesterol levels should be treated with medicines if diet and exercise are not working.  If you smoke, find out from your health care provider how to quit. If you do not use tobacco, do not start.  Lung cancer screening is recommended for adults aged 64-80 years who are at high risk for developing lung cancer because of a history of smoking. A yearly low-dose CT scan of the lungs is recommended for people who have at least a 30-pack-year history of smoking and are a current smoker or have quit within the past 15 years. A pack year of smoking is smoking an average of 1 pack of cigarettes a day for 1 year (for example: 1 pack a day for 30 years or 2 packs a day for 15 years). Yearly screening should continue until the smoker has stopped smoking for at least 15 years. Yearly screening should be stopped for people who develop a health problem that would prevent them from having lung cancer treatment.  High blood pressure causes heart disease and increases the risk of stroke. Your blood pressure should be checked at least every 1 to 2 years. Ongoing high blood pressure should be treated with medicines if weight loss and exercise do not work.  If you are 79-57 years old, ask your health care provider if you should take  aspirin to prevent strokes.  Diabetes screening involves taking a blood sample to check your fasting blood sugar level. This should be done once every 3 years, after age 37, if you are within normal weight and without risk factors for diabetes. Testing should be considered at a younger age or be carried out more frequently if you are overweight and have at least 1 risk factor for diabetes.  Breast cancer screening is essential preventive care for women. You should practice "breast self-awareness." This means understanding the normal appearance and feel of your breasts and may include breast self-examination. Any changes detected, no matter how small, should be reported to a health care provider. Women in their 76s and 30s should have a clinical breast exam (CBE) by a health care provider as part of a regular health exam every 1 to 3 years. After age 55, women should have a CBE every year. Starting at age 79, women should consider having a mammogram (breast X-ray test) every year. Women who have a family history of breast  cancer should talk to their health care provider about genetic screening. Women at a high risk of breast cancer should talk to their health care providers about having an MRI and a mammogram every year.  Breast cancer gene (BRCA)-related cancer risk assessment is recommended for women who have family members with BRCA-related cancers. BRCA-related cancers include breast, ovarian, tubal, and peritoneal cancers. Having family members with these cancers may be associated with an increased risk for harmful changes (mutations) in the breast cancer genes BRCA1 and BRCA2. Results of the assessment will determine the need for genetic counseling and BRCA1 and BRCA2 testing.  Routine pelvic exams to screen for cancer are no longer recommended for nonpregnant women who are considered low risk for cancer of the pelvic organs (ovaries, uterus, and vagina) and who do not have symptoms. Ask your health  care provider if a screening pelvic exam is right for you.  If you have had past treatment for cervical cancer or a condition that could lead to cancer, you need Pap tests and screening for cancer for at least 20 years after your treatment. If Pap tests have been discontinued, your risk factors (such as having a new sexual partner) need to be reassessed to determine if screening should be resumed. Some women have medical problems that increase the chance of getting cervical cancer. In these cases, your health care provider may recommend more frequent screening and Pap tests.  Colorectal cancer can be detected and often prevented. Most routine colorectal cancer screening begins at the age of 58 years and continues through age 44 years. However, your health care provider may recommend screening at an earlier age if you have risk factors for colon cancer. On a yearly basis, your health care provider may provide home test kits to check for hidden blood in the stool. Use of a small camera at the end of a tube, to directly examine the colon (sigmoidoscopy or colonoscopy), can detect the earliest forms of colorectal cancer. Talk to your health care provider about this at age 84, when routine screening begins. Direct exam of the colon should be repeated every 5-10 years through age 33 years, unless early forms of pre-cancerous polyps or small growths are found.  Hepatitis C blood testing is recommended for all people born from [birth year] through 1965 and any individual with known risks for hepatitis C.  Pra  Osteoporosis is a disease in which the bones lose minerals and strength with aging. This can result in serious bone fractures or breaks. The risk of osteoporosis can be identified using a bone density scan. Women ages 36 years and over and women at risk for fractures or osteoporosis should discuss screening with their health care providers. Ask your health care provider whether you should take a calcium supplement  or vitamin D to reduce the rate of osteoporosis.  Menopause can be associated with physical symptoms and risks. Hormone replacement therapy is available to decrease symptoms and risks. You should talk to your health care provider about whether hormone replacement therapy is right for you.  Use sunscreen. Apply sunscreen liberally and repeatedly throughout the day. You should seek shade when your shadow is shorter than you. Protect yourself by wearing long sleeves, pants, a wide-brimmed hat, and sunglasses year round, whenever you are outdoors.  Once a month, do a whole body skin exam, using a mirror to look at the skin on your back. Tell your health care provider of new moles, moles that have irregular borders, moles that are  larger than a pencil eraser, or moles that have changed in shape or color.  Stay current with required vaccines (immunizations).  Influenza vaccine. All adults should be immunized every year.  Tetanus, diphtheria, and acellular pertussis (Td, Tdap) vaccine. Pregnant women should receive 1 dose of Tdap vaccine during each pregnancy. The dose should be obtained regardless of the length of time since the last dose. Immunization is preferred during the 27th-36th week of gestation. An adult who has not previously received Tdap or who does not know her vaccine status should receive 1 dose of Tdap. This initial dose should be followed by tetanus and diphtheria toxoids (Td) booster doses every 10 years. Adults with an unknown or incomplete history of completing a 3-dose immunization series with Td-containing vaccines should begin or complete a primary immunization series including a Tdap dose. Adults should receive a Td booster every 10 years.  Zoster vaccine. One dose is recommended for adults aged 65 years or older unless certain conditions are present.  Measles, mumps, and rubella (MMR) vaccine. Adults born before 45 generally are considered immune to measles and mumps. Adults born  in [birth year] or later should have 1 or more doses of MMR vaccine unless there is a contraindication to the vaccine or there is laboratory evidence of immunity to each of the three diseases. A routine second dose of MMR vaccine should be obtained at least 28 days after the first dose for students attending postsecondary schools, health care workers, or international travelers. People who received inactivated measles vaccine or an unknown type of measles vaccine during 1963-1967 should receive 2 doses of MMR vaccine. People who received inactivated mumps vaccine or an unknown type of mumps vaccine before 1979 and are at high risk for mumps infection should consider immunization with 2 doses of MMR vaccine. For females of childbearing age, rubella immunity should be determined. If there is no evidence of immunity, females who are not pregnant should be vaccinated. If there is no evidence of immunity, females who are pregnant should delay immunization until after pregnancy. Unvaccinated health care workers born before [birth year] who lack laboratory evidence of measles, mumps, or rubella immunity or laboratory confirmation of disease should consider measles and mumps immunization with 2 doses of MMR vaccine or rubella immunization with 1 dose of MMR vaccine.  Pneumococcal 13-valent conjugate (PCV13) vaccine. When indicated, a person who is uncertain of her immunization history and has no record of immunization should receive the PCV13 vaccine. An adult aged 35 years or older who has certain medical conditions and has not been previously immunized should receive 1 dose of PCV13 vaccine. This PCV13 should be followed with a dose of pneumococcal polysaccharide (PPSV23) vaccine. The PPSV23 vaccine dose should be obtained at least 8 weeks after the dose of PCV13 vaccine. An adult aged 56 years or older who has certain medical conditions and previously received 1 or more doses of PPSV23 vaccine should receive 1 dose of PCV13. The PCV13  vaccine dose should be obtained 1 or more years after the last PPSV23 vaccine dose.    Pneumococcal polysaccharide (PPSV23) vaccine. When PCV13 is also indicated, PCV13 should be obtained first. All adults aged 62 years and older should be immunized. An adult younger than age 65 years who has certain medical conditions should be immunized. Any person who resides in a nursing home or long-term care facility should be immunized. An adult smoker should be immunized. People with an immunocompromised condition and certain other conditions should receive both PCV13  and PPSV23 vaccines. People with human immunodeficiency virus (HIV) infection should be immunized as soon as possible after diagnosis. Immunization during chemotherapy or radiation therapy should be avoided. Routine use of PPSV23 vaccine is not recommended for American Indians, [place] Natives, or people younger than 65 years unless there are medical conditions that require PPSV23 vaccine. When indicated, people who have unknown immunization and have no record of immunization should receive PPSV23 vaccine. One-time revaccination 5 years after the first dose of PPSV23 is recommended for people aged 19-64 years who have chronic kidney failure, nephrotic syndrome, asplenia, or immunocompromised conditions. People who received 1-2 doses of PPSV23 before age 20 years should receive another dose of PPSV23 vaccine at age 61 years or later if at least 5 years have passed since the previous dose. Doses of PPSV23 are not needed for people immunized with PPSV23 at or after age 44 years.  Preventive Services / Frequency   Ages 37 to 91 years  Blood pressure check.  Lipid and cholesterol check.  Lung cancer screening. / Every year if you are aged 29-80 years and have a 30-pack-year history of smoking and currently smoke or have quit within the past 15 years. Yearly screening is stopped once you have quit smoking for at least 15 years or develop a health  problem that would prevent you from having lung cancer treatment.  Clinical breast exam.** / Every year after age 76 years.  BRCA-related cancer risk assessment.** / For women who have family members with a BRCA-related cancer (breast, ovarian, tubal, or peritoneal cancers).  Mammogram.** / Every year beginning at age 23 years and continuing for as long as you are in good health. Consult with your health care provider.  Pap test.** / Every 3 years starting at age 62 years through age 86 or 75 years with a history of 3 consecutive normal Pap tests.  HPV screening.** / Every 3 years from ages 22 years through ages 16 to 40 years with a history of 3 consecutive normal Pap tests.  Fecal occult blood test (FOBT) of stool. / Every year beginning at age 20 years and continuing until age 6 years. You may not need to do this test if you get a colonoscopy every 10 years.  Flexible sigmoidoscopy or colonoscopy.** / Every 5 years for a flexible sigmoidoscopy or every 10 years for a colonoscopy beginning at age 19 years and continuing until age 13 years.  Hepatitis C blood test.** / For all people born from [birth year] through 1965 and any individual with known risks for hepatitis C.  Skin self-exam. / Monthly.  Influenza vaccine. / Every year.  Tetanus, diphtheria, and acellular pertussis (Tdap/Td) vaccine.** / Consult your health care provider. Pregnant women should receive 1 dose of Tdap vaccine during each pregnancy. 1 dose of Td every 10 years.  Zoster vaccine.** / 1 dose for adults aged 92 years or older.  Pneumococcal 13-valent conjugate (PCV13) vaccine.** / Consult your health care provider.  Pneumococcal polysaccharide (PPSV23) vaccine.** / 1 to 2 doses if you smoke cigarettes or if you have certain conditions.

## 2013-12-11 LAB — URINALYSIS, MICROSCOPIC ONLY
Bacteria, UA: NONE SEEN
CASTS: NONE SEEN
CRYSTALS: NONE SEEN
SQUAMOUS EPITHELIAL / LPF: NONE SEEN

## 2013-12-11 LAB — TSH: TSH: 1.247 u[IU]/mL (ref 0.350–4.500)

## 2013-12-11 LAB — HEPATIC FUNCTION PANEL
ALBUMIN: 4.1 g/dL (ref 3.5–5.2)
ALT: 17 U/L (ref 0–35)
AST: 16 U/L (ref 0–37)
Alkaline Phosphatase: 47 U/L (ref 39–117)
BILIRUBIN TOTAL: 0.6 mg/dL (ref 0.2–1.2)
Bilirubin, Direct: 0.1 mg/dL (ref 0.0–0.3)
Indirect Bilirubin: 0.5 mg/dL (ref 0.2–1.2)
Total Protein: 6.3 g/dL (ref 6.0–8.3)

## 2013-12-11 LAB — MAGNESIUM: MAGNESIUM: 1.8 mg/dL (ref 1.5–2.5)

## 2013-12-11 LAB — IRON AND TIBC
%SAT: 22 % (ref 20–55)
Iron: 86 ug/dL (ref 42–145)
TIBC: 392 ug/dL (ref 250–470)
UIBC: 306 ug/dL (ref 125–400)

## 2013-12-11 LAB — MICROALBUMIN / CREATININE URINE RATIO
Creatinine, Urine: 99.5 mg/dL
Microalb Creat Ratio: 4 mg/g (ref 0.0–30.0)
Microalb, Ur: 0.4 mg/dL (ref <2.0)

## 2013-12-11 LAB — VITAMIN D 25 HYDROXY (VIT D DEFICIENCY, FRACTURES): VIT D 25 HYDROXY: 63 ng/mL (ref 30–89)

## 2013-12-11 LAB — BASIC METABOLIC PANEL WITH GFR
BUN: 21 mg/dL (ref 6–23)
CALCIUM: 9.7 mg/dL (ref 8.4–10.5)
CHLORIDE: 107 meq/L (ref 96–112)
CO2: 25 mEq/L (ref 19–32)
CREATININE: 0.72 mg/dL (ref 0.50–1.10)
GFR, Est African American: >89 mL/min
GFR, Est Non African American: >89 mL/min
GLUCOSE: 83 mg/dL (ref 70–99)
Potassium: 4.3 mEq/L (ref 3.5–5.3)
Sodium: 141 mEq/L (ref 135–145)

## 2013-12-11 LAB — LIPID PANEL
CHOL/HDL RATIO: 4.8 ratio
Cholesterol: 216 mg/dL — ABNORMAL HIGH (ref 0–200)
HDL: 45 mg/dL (ref >39)
LDL CALC: 134 mg/dL — AB (ref 0–99)
Triglycerides: 185 mg/dL — ABNORMAL HIGH (ref <150)
VLDL: 37 mg/dL (ref 0–40)

## 2013-12-11 LAB — INSULIN, FASTING: INSULIN FASTING, SERUM: 45.2 u[IU]/mL — AB (ref 2.0–19.6)

## 2013-12-11 LAB — VITAMIN B12: Vitamin B-12: 637 pg/mL (ref 211–911)

## 2013-12-19 ENCOUNTER — Other Ambulatory Visit: Payer: Self-pay

## 2013-12-19 MED ORDER — LISINOPRIL-HYDROCHLOROTHIAZIDE 10-12.5 MG PO TABS: 1.0000 | ORAL_TABLET | Freq: Every day | ORAL | Status: DC

## 2013-12-20 ENCOUNTER — Other Ambulatory Visit: Payer: Self-pay | Admitting: Physician Assistant

## 2014-02-07 ENCOUNTER — Other Ambulatory Visit: Payer: Self-pay | Admitting: Internal Medicine

## 2014-02-20 ENCOUNTER — Ambulatory Visit (INDEPENDENT_AMBULATORY_CARE_PROVIDER_SITE_OTHER): Payer: BLUE CROSS/BLUE SHIELD | Admitting: Physician Assistant

## 2014-02-20 ENCOUNTER — Ambulatory Visit (HOSPITAL_COMMUNITY)
Admission: RE | Admit: 2014-02-20 | Discharge: 2014-02-20 | Disposition: A | Discharge status: Home / Self Care | Payer: BLUE CROSS/BLUE SHIELD | Source: Ambulatory Visit | Attending: Physician Assistant | Admitting: Physician Assistant

## 2014-02-20 ENCOUNTER — Encounter: Payer: Self-pay | Admitting: Physician Assistant

## 2014-02-20 VITALS — BP 160/88 | HR 106 | Temp 102.2°F | Resp 18 | Ht 67.0 in | Wt 188.0 lb

## 2014-02-20 DIAGNOSIS — R059 Cough, unspecified: Secondary | ICD-10-CM

## 2014-02-20 DIAGNOSIS — R05 Cough: Secondary | ICD-10-CM

## 2014-02-20 DIAGNOSIS — R0989 Other specified symptoms and signs involving the circulatory and respiratory systems: Secondary | ICD-10-CM | POA: Insufficient documentation

## 2014-02-20 DIAGNOSIS — J9811 Atelectasis: Secondary | ICD-10-CM | POA: Diagnosis not present

## 2014-02-20 DIAGNOSIS — J209 Acute bronchitis, unspecified: Secondary | ICD-10-CM

## 2014-02-20 MED ORDER — BENZONATATE 100 MG PO CAPS: 200.0000 mg | ORAL_CAPSULE | Freq: Three times a day (TID) | ORAL | Status: DC | PRN

## 2014-02-20 MED ORDER — AZITHROMYCIN 250 MG PO TABS: ORAL_TABLET | ORAL | Status: AC

## 2014-02-20 MED ORDER — IPRATROPIUM-ALBUTEROL 0.5-2.5 (3) MG/3ML IN SOLN
3.0000 mL | Freq: Once | RESPIRATORY_TRACT | Status: AC
  Administered 2014-02-20: 3 mL via RESPIRATORY_TRACT

## 2014-02-20 MED ORDER — ALBUTEROL SULFATE 108 (90 BASE) MCG/ACT IN AEPB: 2.0000 | INHALATION_SPRAY | Freq: Four times a day (QID) | RESPIRATORY_TRACT | Status: DC | PRN

## 2014-02-20 NOTE — Progress Notes (Signed)
HPI  A Caucasian 54 y.o.female presents to the office today due to fever, cough and wheezing for 1 week.  Her sxs are getting worse. Had fever at home (Max 101) last night.  Sick contacts at work.  Has tried Tussin DM and Ibuprofen OTC with mild relief.  Never smoked.  She reports sweats, fatigue, congestion, left ear pain, rhinorrhea, post-nasal drip, sore throat, cough with thick clear mucus, SOB, wheezing, CP with cough only, nausea and headache.  She denies sinus pressure, trouble swallowing, voice change, abdominal pain, vomiting, diarrhea, constipation, dizziness and lightheadedness.  Review of Systems  All other systems reviewed and are negative.  Past Medical History-  Past Medical History  Diagnosis Date  . Bipolar disorder   . Hemorrhoids   . Hypertension   . Vitamin D deficiency   . Insulin resistance    Medications-  Current Outpatient Prescriptions on File Prior to Visit  Medication Sig Dispense Refill  . BABY ASPIRIN PO Take 81 mg by mouth 2 (two) times daily.     . bisoprolol-hydrochlorothiazide (ZIAC) 5-6.25 MG per tablet TAKE ONE TABLET BY MOUTH ONCE DAILY 30 tablet 2  . Calcium Carbonate (CALCIUM 600 PO) Take 600 mg by mouth daily.    . cholecalciferol (VITAMIN D) 1000 UNITS tablet Take 10,000 Units by mouth daily.     . fluticasone (FLONASE) 50 MCG/ACT nasal spray Place 2 sprays into both nostrils daily. 16 g 2  . lisinopril-hydrochlorothiazide (PRINZIDE,ZESTORETIC) 10-12.5 MG per tablet Take 1 tablet by mouth daily. 90 tablet 1  . lithium carbonate 300 MG capsule Takes 1-2 pills daily  0  . Omega-3 Fatty Acids (FISH OIL PO) Take by mouth daily.     No current facility-administered medications on file prior to visit.   Allergies-  Allergies  Allergen Reactions  . Prednisolone    Physical Exam BP 160/88 mmHg  Pulse 106  Temp(Src) 102.2 F (39 C) (Temporal)  Resp 18  Ht  (1.702 m)  Wt 188 lb (85.276 kg)  BMI 29.44 kg/m2  SpO2 97%  LMP  02/19/2014 Wt Readings from Last 3 Encounters:  02/20/14 188 lb (85.276 kg)  12/10/13 194 lb 6.4 oz (88.179 kg)  05/30/13 195 lb 12.8 oz (88.814 kg)  Vitals Reviewed. General Appearance: Well nourished, in no apparent distress and had pleasant demeanor. Eyes:  PERRLA. EOMI. Conjunctiva is pink without edema, erythema or yellowing.  No scleral icterus. Sinuses: No Frontal/maxillary tenderness Ears: No erythema, edema or tenderness on both external ear cartilages and ear canals.  TMs are intact bilaterally with normal light reflexes and without erythema, edema or bulging. Nose: Nose is symmetrical and turbinates are erythematous and non-edematous bilaterally.  No polyps or paleness.  Rhinorrhea present. Throat: Oral pharynx is pink and moist. Erythematous and non-edematous posterior pharynx.  Mucosa is intact and without lesions. Uvula is midline and not swollen. Neck: Supple,  LAD, trachea is midline. Full range of motion in neck intact Respiratory: Wheezing in all lung fields and decreased breath sounds in right lung.  No rales, rhonchi or stridor.  No increased effort of breathing. Cardio: RRR.   m/r/g.  S1S2nl.   Abdomen: Symmetrical, soft, nontender, and rounded.  +BS nl x4.   Extremities:  C/C/E in upper and lower extremities. Pulses B/L +2  Skin: Feverish skin to touch, dry, intact without rashes, lesions, ecchymosis, yellowing, cyanosis.   Neuro: Alert and oriented X3, cooperative.  Mood and affect appropriate to situation.  CN II-XII grossly intact.  Psych: Insight and Judgment appropriate.   Assessment and Plan 1. Acute bronchitis, unspecified organism - azithromycin (ZITHROMAX) 250 MG tablet; Take 2 tablets PO on day 1, then 1 tablet PO Q24H x 4 days  Dispense: 6 tablet; Refill: 1 - benzonatate (TESSALON PERLES) 100 MG capsule; Take 2 capsules (200 mg total) by mouth 3 (three) times daily as needed for cough (Max: 600mg  per day (6 capsules per day)).  Dispense: 120 capsule;  Refill: 0 - Albuterol Sulfate (PROAIR RESPICLICK) 108 (90 BASE) MCG/ACT AEPB; Inhale 2 puffs into the lungs every 6 (six) hours as needed.  Dispense: 1 each; Refill: 1 -Gave DuoNeb in office- ipratropium-albuterol (DUONEB) 0.5-2.5 (3) MG/3ML nebulizer solution 3 mL; Take 3 mLs by nebulization once. Tylenol or Ibuprofen for fever- follow directions on bottle.  2. Cough Ordered chest x-ray to R/O Pneumonia. - DG Chest 2 View; Future  Discussed medication effects and SE's.  Pt agreed to treatment plan. If you are not feeling better in 10-14 days, then please call the office. Please keep your follow up appt on 03/18/14.  Addendum: Chest X-ray showed "IMPRESSION: Mild subsegmental atelectasis right middle lobe. No acute cardiopulmonary disease otherwise noted."  Continue medications as prescribed.    Curry Dulski, Lise AuerJennifer L, PA-C 9:05 AM [facility]

## 2014-02-20 NOTE — Patient Instructions (Signed)
-Take z-Pak as prescribed. -Take Tylenol or Ibuprofen for fever and follow directions on bottle -Take Tessalon Perles for cough -Take Proair Inhaler for wheezing and SOB as prescribed. [facility] after appt and get chest x-ray and I will call you with results.  If pneumonia, then please follow up in 1 week.  If you are not feeling better in 10-14 days, then please call the office.   Acute Bronchitis Bronchitis is inflammation of the airways that extend from the windpipe into the lungs (bronchi). The inflammation often causes mucus to develop. This leads to a cough, which is the most common symptom of bronchitis.  In acute bronchitis, the condition usually develops suddenly and goes away over time, usually in a couple weeks. Smoking, allergies, and asthma can make bronchitis worse. Repeated episodes of bronchitis may cause further lung problems.  CAUSES Acute bronchitis is most often caused by the same virus that causes a cold. The virus can spread from person to person (contagious) through coughing, sneezing, and touching contaminated objects. SIGNS AND SYMPTOMS   Cough.   Fever.   Coughing up mucus.   Body aches.   Chest congestion.   Chills.   Shortness of breath.   Sore throat.  DIAGNOSIS  Acute bronchitis is usually diagnosed through a physical exam. Your health care provider will also ask you questions about your medical history. Tests, such as chest X-rays, are sometimes done to rule out other conditions.  TREATMENT  Acute bronchitis usually goes away in a couple weeks. Oftentimes, no medical treatment is necessary. Medicines are sometimes given for relief of fever or cough. Antibiotic medicines are usually not needed but may be prescribed in certain situations. In some cases, an inhaler may be recommended to help reduce shortness of breath and control the cough. A cool mist vaporizer may also be used to help thin bronchial secretions and make it  easier to clear the chest.  HOME CARE INSTRUCTIONS  Get plenty of rest.   Drink enough fluids to keep your urine clear or pale yellow (unless you have a medical condition that requires fluid restriction). Increasing fluids may help thin your respiratory secretions (sputum) and reduce chest congestion, and it will prevent dehydration.   Take medicines only as directed by your health care provider.  If you were prescribed an antibiotic medicine, finish it all even if you start to feel better.  Avoid smoking and secondhand smoke. Exposure to cigarette smoke or irritating chemicals will make bronchitis worse. If you are a smoker, consider using nicotine gum or skin patches to help control withdrawal symptoms. Quitting smoking will help your lungs heal faster.   Reduce the chances of another bout of acute bronchitis by washing your hands frequently, avoiding people with cold symptoms, and trying not to touch your hands to your mouth, nose, or eyes.   Keep all follow-up visits as directed by your health care provider.  SEEK MEDICAL CARE IF: Your symptoms do not improve after 1 week of treatment.  SEEK IMMEDIATE MEDICAL CARE IF:  You develop an increased fever or chills.   You have chest pain.   You have severe shortness of breath.  You have bloody sputum.   You develop dehydration.  You faint or repeatedly feel like you are going to pass out.  You develop repeated vomiting.  You develop a severe headache. MAKE SURE YOU:   Understand these instructions.  Will watch your condition.  Will get help right away if you are  not doing well or get worse. Document Released: 02/26/2004 Document Revised: 06/04/2013 Document Reviewed: 07/11/2012 [facility] Patient Information 2015 [place], [place]. This information is not intended to replace advice given to you by your health care provider. Make sure you discuss any questions you have with your health care provider.

## 2014-03-18 ENCOUNTER — Ambulatory Visit: Payer: Self-pay | Admitting: Physician Assistant

## 2014-06-27 ENCOUNTER — Ambulatory Visit: Payer: Self-pay | Admitting: Internal Medicine

## 2014-07-08 ENCOUNTER — Ambulatory Visit (INDEPENDENT_AMBULATORY_CARE_PROVIDER_SITE_OTHER): Payer: BLUE CROSS/BLUE SHIELD | Admitting: Internal Medicine

## 2014-07-08 ENCOUNTER — Encounter: Payer: Self-pay | Admitting: Internal Medicine

## 2014-07-08 VITALS — BP 104/62 | HR 64 | Temp 97.8°F | Resp 16 | Ht 67.25 in | Wt 190.0 lb

## 2014-07-08 DIAGNOSIS — Z79899 Other long term (current) drug therapy: Secondary | ICD-10-CM

## 2014-07-08 DIAGNOSIS — E8881 Metabolic syndrome: Secondary | ICD-10-CM

## 2014-07-08 DIAGNOSIS — E782 Mixed hyperlipidemia: Secondary | ICD-10-CM

## 2014-07-08 DIAGNOSIS — F319 Bipolar disorder, unspecified: Secondary | ICD-10-CM

## 2014-07-08 DIAGNOSIS — I10 Essential (primary) hypertension: Secondary | ICD-10-CM

## 2014-07-08 DIAGNOSIS — E559 Vitamin D deficiency, unspecified: Secondary | ICD-10-CM

## 2014-07-08 DIAGNOSIS — E88819 Insulin resistance, unspecified: Secondary | ICD-10-CM

## 2014-07-08 LAB — BASIC METABOLIC PANEL WITH GFR
BUN: 20 mg/dL (ref 6–23)
CALCIUM: 10 mg/dL (ref 8.4–10.5)
CHLORIDE: 104 meq/L (ref 96–112)
CO2: 26 mEq/L (ref 19–32)
CREATININE: 0.81 mg/dL (ref 0.50–1.10)
GFR, Est African American: >89 mL/min
GFR, Est Non African American: 84 mL/min
Glucose, Bld: 92 mg/dL (ref 70–99)
Potassium: 4.1 mEq/L (ref 3.5–5.3)
SODIUM: 139 meq/L (ref 135–145)

## 2014-07-08 LAB — HEPATIC FUNCTION PANEL
ALK PHOS: 48 U/L (ref 39–117)
ALT: 16 U/L (ref 0–35)
AST: 15 U/L (ref 0–37)
Albumin: 4.3 g/dL (ref 3.5–5.2)
Bilirubin, Direct: 0.1 mg/dL (ref 0.0–0.3)
Indirect Bilirubin: 0.7 mg/dL (ref 0.2–1.2)
TOTAL PROTEIN: 6.4 g/dL (ref 6.0–8.3)
Total Bilirubin: 0.8 mg/dL (ref 0.2–1.2)

## 2014-07-08 LAB — HEMOGLOBIN A1C
Hgb A1c MFr Bld: 5.6 % (ref <5.7)
Mean Plasma Glucose: 114 mg/dL (ref <117)

## 2014-07-08 LAB — CBC WITH DIFFERENTIAL/PLATELET
BASOS ABS: 0.1 10*3/uL (ref 0.0–0.1)
Basophils Relative: 1 % (ref 0–1)
Eosinophils Absolute: 0.2 10*3/uL (ref 0.0–0.7)
Eosinophils Relative: 3 % (ref 0–5)
HCT: 36.8 % (ref 36.0–46.0)
Hemoglobin: 12.4 g/dL (ref 12.0–15.0)
LYMPHS ABS: 1.7 10*3/uL (ref 0.7–4.0)
LYMPHS PCT: 33 % (ref 12–46)
MCH: 29 pg (ref 26.0–34.0)
MCHC: 33.7 g/dL (ref 30.0–36.0)
MCV: 86 fL (ref 78.0–100.0)
MONO ABS: 0.4 10*3/uL (ref 0.1–1.0)
MPV: 9.6 fL (ref 8.6–12.4)
Monocytes Relative: 8 % (ref 3–12)
Neutro Abs: 2.9 10*3/uL (ref 1.7–7.7)
Neutrophils Relative %: 55 % (ref 43–77)
Platelets: 192 10*3/uL (ref 150–400)
RBC: 4.28 MIL/uL (ref 3.87–5.11)
RDW: 14.2 % (ref 11.5–15.5)
WBC: 5.2 10*3/uL (ref 4.0–10.5)

## 2014-07-08 LAB — LIPID PANEL
CHOL/HDL RATIO: 5.3 ratio
Cholesterol: 221 mg/dL — ABNORMAL HIGH (ref 0–200)
HDL: 42 mg/dL — ABNORMAL LOW (ref >=46)
LDL Cholesterol: 142 mg/dL — ABNORMAL HIGH (ref 0–99)
Triglycerides: 186 mg/dL — ABNORMAL HIGH (ref <150)
VLDL: 37 mg/dL (ref 0–40)

## 2014-07-08 LAB — TSH: TSH: 1.473 u[IU]/mL (ref 0.350–4.500)

## 2014-07-08 LAB — MAGNESIUM: Magnesium: 1.9 mg/dL (ref 1.5–2.5)

## 2014-07-08 NOTE — Progress Notes (Signed)
Patient ID: Tanya Tucker, female   DOB: August 28, 1960, 54 y.o.   MRN: [id number] This very nice 54 y.o.MWF presents for complete physical. Patient has been followed for HTN, Prediabetes, Hyperlipidemia, and Vitamin D Deficiency. Patienty has a bipolar disorder predating since 1994 and she is [facility] by Dr Tomasa Randunningham and apparently is stable on current medicine regimen.   Patient has mild labile HTN predates since 2010 and take her meds infrequently. Patient's BP has been controlled at home and patient denies any cardiac symptoms as chest pain, palpitations, shortness of breath, dizziness or ankle swelling. Today's BP: 112/74 mmHg   Patient's hyperlipidemia is controlled with diet. Patient denies myalgias or other medication SE's. Last lipids were at goal - T. Chol 174, HDL 48 and LDL 94 all at goal except a slightly elevatedTG 160.   Patient has Prediabetes/Insulin Resistance predating since Sept 2014 with a normal A1c 5.4% and elevated insulin 42. Patient denies reactive hypoglycemic symptoms, visual blurring, diabetic polys, or paresthesias. Last A1c was 5.4% on 05/30/2013.  Finally, patient has history of Vitamin D Deficiency (19 in 2010) and last Vitamin D was 66 on 05/30/2013 ============================   This very nice 54 y.o.female presents for 3 month follow up with Hypertension, Hyperlipidemia, Pre-Diabetes and Vitamin D Deficiency.    Patient is treated for HTN & BP has been controlled at home. Today's  . Patient has had no complaints of any cardiac type chest pain, palpitations, dyspnea/orthopnea/PND, dizziness, claudication, or dependent edema.   Hyperlipidemia is controlled with diet & meds. Patient denies myalgias or other med SE's. Last Lipids werenot at goal - Total Chol216; HDL 45; LDL  134; Trig 185 on 12/10/2013.   Also, the patient has history of PreDiabetes and has had no symptoms of reactive  hypoglycemia, diabetic polys, paresthesias or visual blurring.  Last A1c was  5.3% on 12/10/2013.    Further, the patient also has history of Vitamin D Deficiency and supplements vitamin D without any suspected side-effects. Last vitamin D was  63 on 12/10/2013  Outpatient Prescriptions Prior to Visit  Medication Sig  . Albuterol Sulfate (PROAIR RESPICLICK)  Inhale 2 puffs into the lungs every 6 (six) hours as needed.  Marland Kitchen. BABY ASPIRIN Take 81 mg by mouth 2 (two) times daily.   . bisoprolol-hctz (ZIAC) 5-6.25 MG  TAKE ONE TABLET BY MOUTH ONCE DAILY  . Calcium Carbonate (CALCIUM 600 PO) Take 600 mg by mouth daily.  . cholecalciferol (VITAMIN D) 1000 UNITS tablet Take 10,000 Units by mouth daily.   . fluticasone (FLONASE) 50 MCG/ACT nasal spray Place 2 sprays into both nostrils daily.  Marland Kitchen. lisinopril-hctz 10-12.5 MG  Take 1 tablet by mouth daily.  Marland Kitchen. lithium carbonate 300 MG  Takes 1-2 pills daily  . FISH OIL Take by mouth daily.   Allergies  Allergen Reactions  . Prednisolone    PMHx:   Past Medical History  Diagnosis Date  . Bipolar disorder   . Hemorrhoids   . Hypertension   . Vitamin D deficiency   . Insulin resistance    Immunization History  Administered Date(s) Administered  . Influenza Split 10/24/2012, 12/10/2013  . PPD Test 12/10/2013  . Pneumococcal Polysaccharide-23 10/24/2012  . Tdap 07/19/2007   Past Surgical History  Procedure Laterality Date  . Tonsillectomy and adenoidectomy     FHx:    Reviewed / unchanged  SHx:    Reviewed / unchanged  Systems Review:  Constitutional: Denies fever, chills, wt changes, headaches, insomnia, fatigue,  night sweats, change in appetite. Eyes: Denies redness, blurred vision, diplopia, discharge, itchy, watery eyes.  ENT: Denies discharge, congestion, post nasal drip, epistaxis, sore throat, earache, hearing loss, dental pain, tinnitus, vertigo, sinus pain, snoring.  CV: Denies chest pain, palpitations, irregular heartbeat, syncope,  dyspnea, diaphoresis, orthopnea, PND, claudication or edema. Respiratory: denies cough, dyspnea, DOE, pleurisy, hoarseness, laryngitis, wheezing.  Gastrointestinal: Denies dysphagia, odynophagia, heartburn, reflux, water brash, abdominal pain or cramps, nausea, vomiting, bloating, diarrhea, constipation, hematemesis, melena, hematochezia  or hemorrhoids. Genitourinary: Denies dysuria, frequency, urgency, nocturia, hesitancy, discharge, hematuria or flank pain. Musculoskeletal: Denies arthralgias, myalgias, stiffness, jt. swelling, pain, limping or strain/sprain.  Skin: Denies pruritus, rash, hives, warts, acne, eczema or change in skin lesion(s). Neuro: No weakness, tremor, incoordination, spasms, paresthesia or pain. Psychiatric: Denies confusion, memory loss or sensory loss. Endo: Denies change in weight, skin or hair change.  Heme/Lymph: No excessive bleeding, bruising or enlarged lymph nodes.  Physical Exam  There were no vitals taken for this visit.  Appears well nourished and in no distress. Eyes: PERRLA, EOMs, conjunctiva no swelling or erythema. Sinuses: No frontal/maxillary tenderness ENT/Mouth: EAC's clear, TM's nl w/o erythema, bulging. Nares clear w/o erythema, swelling, exudates. Oropharynx clear without erythema or exudates. Oral hygiene is good. Tongue normal, non obstructing. Hearing intact.  Neck: Supple. Thyroid nl. Car 2+/2+ without bruits, nodes or JVD. Chest: Respirations nl with BS clear & equal w/o rales, rhonchi, wheezing or stridor.  Cor: Heart sounds normal w/ regular rate and rhythm without sig. murmurs, gallops, clicks, or rubs. Peripheral pulses normal and equal  without edema.  Abdomen: Soft & bowel sounds normal. Non-tender w/o guarding, rebound, hernias, masses, or organomegaly.  Lymphatics: Unremarkable.  Musculoskeletal: Full ROM all peripheral extremities, joint stability, 5/5 strength, and normal gait.  Skin: Warm, dry without exposed rashes, lesions or  ecchymosis apparent.  Neuro: Cranial nerves intact, reflexes equal bilaterally. Sensory-motor testing grossly intact. Tendon reflexes grossly intact.  Pysch: Alert & oriented x 3.  Insight and judgement nl & appropriate. No ideations.  Assessment and Plan:    Recommended regular exercise, BP monitoring, weight control, and discussed med and SE's. Recommended labs to assess and monitor clinical status. Further disposition pending results of labs. Over 30 minutes of exam, counseling, chart review was performed

## 2014-07-08 NOTE — Patient Instructions (Signed)
Ways to cut 100 calories  1. Eat your eggs with hot sauce OR salsa instead of cheese.  Eggs are great for breakfast, but many people consider eggs and cheese to be BFFs. Instead of cheese-1 oz. of cheddar has 114 calories-top your eggs with hot sauce, which contains no calories and helps with satiety and metabolism. Salsa is also a great option!!  2. Top your toast, waffles or pancakes with mashed berries instead of jelly or syrup. Half a cup of berries-fresh, frozen or thawed-has about 40 calories, compared with 2 tbsp. of maple syrup or jelly, which both have about 100 calories. The berries will also give you a good punch of fiber, which helps keep you full and satisfied and won't spike blood sugar quickly like the jelly or syrup. 3. Swap the non-fat latte for black coffee with a splash of half-and-half. Contrary to its name, that non-fat latte has 130 calories and a startling 19g of carbohydrates per 16 oz. serving. Replacing that 'light' drinkable dessert with a black coffee with a splash of half-and-half saves you more than 100 calories per 16 oz. serving. 4. Sprinkle salads with freeze-dried raspberries instead of dried cranberries. If you want a sweet addition to your nutritious salad, stay away from dried cranberries. They have a whopping 130 calories per  cup and 30g carbohydrates. Instead, sprinkle freeze-dried raspberries guilt-free and save more than 100 calories per  cup serving, adding 3g of belly-filling fiber. 5. Go for mustard in place of mayo on your sandwich. Mustard can add really nice flavor to any sandwich, and there are tons of varieties, from spicy to honey. A serving of mayo is 95 calories, versus 10 calories in a serving of mustard. 6. Choose a DIY salad dressing instead of the store-bought kind. Mix Dijon or whole grain mustard with low-fat Kefir or red wine vinegar and garlic. 7. Use hummus as a spread instead of a dip. Use hummus as a spread on a high-fiber cracker or  tortilla with a sandwich and save on calories without sacrificing taste. 8. Pick just one salad "accessory." Salad isn't automatically a calorie winner. It's easy to over-accessorize with toppings. Instead of topping your salad with nuts, avocado and cranberries (all three will clock in at 313 calories), just pick one. The next day, choose a different accessory, which will also keep your salad interesting. You don't wear all your jewelry every day, right? 9. Ditch the white pasta in favor of spaghetti squash. One cup of cooked spaghetti squash has about 40 calories, compared with traditional spaghetti, which comes with more than 200. Spaghetti squash is also nutrient-dense. It's a good source of fiber and Vitamins A and C, and it can be eaten just like you would eat pasta-with a great tomato sauce and turkey meatballs or with pesto, tofu and spinach, for example. 10. Dress up your chili, soups and stews with non-fat Greek yogurt instead of sour cream. Just a 'dollop' of sour cream can set you back 115 calories and a whopping 12g of fat-seven of which are of the artery-clogging variety. Added bonus: Greek yogurt is packed with muscle-building protein, calcium and B Vitamins. 11. Mash cauliflower instead of mashed potatoes. One cup of traditional mashed potatoes-in all their creamy goodness-has more than 200 calories, compared to mashed cauliflower, which you can typically eat for less than 100 calories per 1 cup serving. Cauliflower is a great source of the antioxidant indole-3-carbinol (I3C), which may help reduce the risk of some cancers, like breast   cancer. 12. Ditch the ice cream sundae in favor of a Greek yogurt parfait. Instead of a cup of ice cream or fro-yo for dessert, try 1 cup of nonfat Greek yogurt topped with fresh berries and a sprinkle of cacao nibs. Both toppings are packed with antioxidants, which can help reduce cellular inflammation and oxidative damage. And the comparison is a  no-brainer: One cup of ice cream has about 275 calories; one cup of frozen yogurt has about 230; and a cup of Greek yogurt has just 130, plus twice the protein, so you're less likely to return to the freezer for a second helping. 13. Put olive oil in a spray container instead of using it directly from the bottle. Each tablespoon of olive oil is 120 calories and 15g of fat. Use a mister instead of pouring it straight into the pan or onto a salad. This allows for portion control and will save you more than 100 calories. 14. When baking, substitute canned pumpkin for butter or oil. Canned pumpkin-not pumpkin pie mix-is loaded with Vitamin A, which is important for skin and eye health, as well as immunity. And the comparisons are pretty crazy:  cup of canned pumpkin has about 40 calories, compared to butter or oil, which has more than 800 calories. Yes, 800 calories. Applesauce and mashed banana can also serve as good substitutions for butter or oil, usually in a 1:1 ratio. 15. Top casseroles with high-fiber cereal instead of breadcrumbs. Breadcrumbs are typically made with white bread, while breakfast cereals contain 5-9g of fiber per serving. Not only will you save more than 150 calories per  cup serving, the swap will also keep you more full and you'll get a metabolism boost from the added fiber. 16. Snack on pistachios instead of macadamia nuts. Believe it or not, you get the same amount of calories from 35 pistachios (100 calories) as you would from only five macadamia nuts. 17. Chow down on kale chips rather than potato chips. This is my favorite 'don't knock it 'till you try it' swap. Kale chips are so easy to make at home, and you can spice them up with a little grated parmesan or chili powder. Plus, they're a mere fraction of the calories of potato chips, but with the same crunch factor we crave so often. 18. Add seltzer and some fruit slices to your cocktail instead of soda or fruit juice. One  cup of soda or fruit juice can pack on as much as 140 calories. Instead, use seltzer and fruit slices. The fruit provides valuable phytochemicals, such as flavonoids and anthocyanins, which help to combat cancer and stave off the aging process.  

## 2014-07-08 NOTE — Progress Notes (Signed)
Patient ID: Tanya Tucker, female   DOB: April 26, 1960, 54 y.o.   MRN: [id number]  Assessment and Plan:  Hypertension:  -Continue medication,  -monitor blood pressure at home.  -Continue DASH diet.   -Reminder to go to the ER if any CP, SOB, nausea, dizziness, severe HA, changes vision/speech, left arm numbness and tingling, and jaw pain. -goal of working up to 14,000 steps per day  Cholesterol: -Continue diet and exercise.  -Check cholesterol.   Pre-diabetes: -Continue diet and exercise.  -Check A1C  Vitamin D Def: -check level -continue medications.   Bipolar -lithium level -cont to see [facility]  Continue diet and meds as discussed. Further disposition pending results of labs.  HPI 54 y.o. female  presents for 3 month follow up with hypertension, hyperlipidemia, prediabetes and vitamin D.   Her blood pressure has been controlled at home, today their BP is BP: 104/62 mmHg.   She does workout.  She reports that she is walking every day.  She reports that she is walking around 3 miles.   She denies chest pain, shortness of breath, dizziness.   She is not on cholesterol medication and denies myalgias. Her cholesterol is not at goal. The cholesterol last visit was:   Lab Results  Component Value Date   CHOL 216* 12/10/2013   HDL 45 12/10/2013   LDLCALC 134* 12/10/2013   TRIG 185* 12/10/2013   CHOLHDL 4.8 12/10/2013     She has been working on diet and exercise for prediabetes, and denies foot ulcerations, hyperglycemia, hypoglycemia , increased appetite, nausea, paresthesia of the feet, polydipsia, polyuria, visual disturbances, vomiting and weight loss. Last A1C in the office was:  Lab Results  Component Value Date   HGBA1C 5.3 12/10/2013    Patient is on Vitamin D supplement.  Lab Results  Component Value Date   VD25OH 63 12/10/2013     Patient does have longstanding bipolar disorder and is [facility]. She reports that it has been doing  really well.  She is trying to make sure that she drinks plenty of water during the summer time due to the lithium.  Current Medications:  Current Outpatient Prescriptions on File Prior to Visit  Medication Sig Dispense Refill  . Albuterol Sulfate (PROAIR RESPICLICK) 108 (90 BASE) MCG/ACT AEPB Inhale 2 puffs into the lungs every 6 (six) hours as needed. 1 each 1  . BABY ASPIRIN PO Take 81 mg by mouth 2 (two) times daily.     . bisoprolol-hydrochlorothiazide (ZIAC) 5-6.25 MG per tablet TAKE ONE TABLET BY MOUTH ONCE DAILY 30 tablet 2  . Calcium Carbonate (CALCIUM 600 PO) Take 600 mg by mouth daily.    . cholecalciferol (VITAMIN D) 1000 UNITS tablet Take 10,000 Units by mouth daily.     . fluticasone (FLONASE) 50 MCG/ACT nasal spray Place 2 sprays into both nostrils daily. 16 g 2  . lisinopril-hydrochlorothiazide (PRINZIDE,ZESTORETIC) 10-12.5 MG per tablet Take 1 tablet by mouth daily. 90 tablet 1  . lithium carbonate 300 MG capsule Takes 1-2 pills daily  0  . Omega-3 Fatty Acids (FISH OIL PO) Take by mouth daily.     No current facility-administered medications on file prior to visit.    Medical History:  Past Medical History  Diagnosis Date  . Bipolar disorder   . Hemorrhoids   . Hypertension   . Vitamin D deficiency   . Insulin resistance     Allergies:  Allergies  Allergen Reactions  . Prednisolone  Review of Systems:  Review of Systems  Constitutional: Negative for fever, chills and malaise/fatigue.  HENT: Negative for congestion, ear pain and sore throat.   Eyes: Negative.   Respiratory: Negative for cough, shortness of breath and wheezing.   Cardiovascular: Negative for chest pain, palpitations and leg swelling.  Gastrointestinal: Negative for heartburn, nausea, vomiting, diarrhea, constipation, blood in stool and melena.  Genitourinary: Negative for dysuria, urgency and frequency.  Skin: Negative.   Neurological: Negative for dizziness, tingling, sensory change,  loss of consciousness and headaches.    Family history- Review and unchanged  Social history- Review and unchanged  Physical Exam: BP 104/62 mmHg  Pulse 64  Temp(Src) 97.8 F (36.6 C) (Temporal)  Resp 16  Ht 5' 7.25" (1.708 m)  Wt 190 lb (86.183 kg)  BMI 29.54 kg/m2  LMP 02/19/2014 Wt Readings from Last 3 Encounters:  07/08/14 190 lb (86.183 kg)  02/20/14 188 lb (85.276 kg)  12/10/13 194 lb 6.4 oz (88.179 kg)    General Appearance: Well nourished well developed, in no apparent distress. Eyes: PERRLA, EOMs, conjunctiva no swelling or erythema ENT/Mouth: Ear canals normal without obstruction, swelling, erythma, discharge.  TMs normal bilaterally.  Oropharynx moist, clear, without exudate, or postoropharyngeal swelling. Neck: Supple, thyroid normal,no cervical adenopathy  Respiratory: Respiratory effort normal, Breath sounds clear A&P without rhonchi, wheeze, or rale.  No retractions, no accessory usage. Cardio: RRR with no MRGs. Brisk peripheral pulses without edema.  Abdomen: Soft, + BS,  Non tender, no guarding, rebound, hernias, masses. Musculoskeletal: Full ROM, 5/5 strength, Normal gait Skin: Warm, dry without rashes, lesions, ecchymosis.  Neuro: Awake and oriented X 3, Cranial nerves intact. Normal muscle tone, no cerebellar symptoms. Psych: Normal affect, Insight and Judgment appropriate.    Tucker, Tanya Kiesel, PA-C 12:10 PM [facility]

## 2014-07-09 LAB — INSULIN, RANDOM: Insulin: 15.9 u[IU]/mL (ref 2.0–19.6)

## 2014-07-09 LAB — LITHIUM LEVEL: Lithium Lvl: 0.9 mEq/L (ref 0.80–1.40)

## 2014-07-11 ENCOUNTER — Other Ambulatory Visit: Payer: Self-pay | Admitting: Internal Medicine

## 2014-07-11 ENCOUNTER — Encounter: Payer: Self-pay | Admitting: Internal Medicine

## 2014-07-11 LAB — VITAMIN D 1,25 DIHYDROXY
VITAMIN D 1, 25 (OH) TOTAL: 54 pg/mL (ref 18–72)
Vitamin D2 1, 25 (OH)2: <8 pg/mL
Vitamin D3 1, 25 (OH)2: 54 pg/mL

## 2014-07-11 MED ORDER — PRAVASTATIN SODIUM 40 MG PO TABS: 40.0000 mg | ORAL_TABLET | Freq: Every evening | ORAL | Status: DC

## 2014-11-11 ENCOUNTER — Other Ambulatory Visit: Payer: Self-pay

## 2014-11-11 DIAGNOSIS — Z1231 Encounter for screening mammogram for malignant neoplasm of breast: Secondary | ICD-10-CM

## 2014-11-19 ENCOUNTER — Ambulatory Visit
Admission: RE | Admit: 2014-11-19 | Discharge: 2014-11-19 | Disposition: A | Discharge status: Home / Self Care | Payer: BLUE CROSS/BLUE SHIELD | Source: Ambulatory Visit

## 2014-11-19 DIAGNOSIS — Z1231 Encounter for screening mammogram for malignant neoplasm of breast: Secondary | ICD-10-CM

## 2014-12-24 ENCOUNTER — Ambulatory Visit (INDEPENDENT_AMBULATORY_CARE_PROVIDER_SITE_OTHER): Payer: BLUE CROSS/BLUE SHIELD | Admitting: Internal Medicine

## 2014-12-24 ENCOUNTER — Encounter: Payer: Self-pay | Admitting: Internal Medicine

## 2014-12-24 VITALS — BP 138/84 | HR 60 | Temp 97.0°F | Resp 16 | Ht 67.0 in | Wt 180.0 lb

## 2014-12-24 DIAGNOSIS — E559 Vitamin D deficiency, unspecified: Secondary | ICD-10-CM

## 2014-12-24 DIAGNOSIS — Z79899 Other long term (current) drug therapy: Secondary | ICD-10-CM

## 2014-12-24 DIAGNOSIS — E782 Mixed hyperlipidemia: Secondary | ICD-10-CM

## 2014-12-24 DIAGNOSIS — R5383 Other fatigue: Secondary | ICD-10-CM

## 2014-12-24 DIAGNOSIS — Z0000 Encounter for general adult medical examination without abnormal findings: Secondary | ICD-10-CM

## 2014-12-24 DIAGNOSIS — Z111 Encounter for screening for respiratory tuberculosis: Secondary | ICD-10-CM

## 2014-12-24 DIAGNOSIS — Z23 Encounter for immunization: Secondary | ICD-10-CM | POA: Diagnosis not present

## 2014-12-24 DIAGNOSIS — E663 Overweight: Secondary | ICD-10-CM | POA: Insufficient documentation

## 2014-12-24 DIAGNOSIS — I10 Essential (primary) hypertension: Secondary | ICD-10-CM | POA: Diagnosis not present

## 2014-12-24 DIAGNOSIS — Z6828 Body mass index (BMI) 28.0-28.9, adult: Secondary | ICD-10-CM

## 2014-12-24 DIAGNOSIS — E8881 Metabolic syndrome: Secondary | ICD-10-CM

## 2014-12-24 DIAGNOSIS — Z0001 Encounter for general adult medical examination with abnormal findings: Secondary | ICD-10-CM

## 2014-12-24 DIAGNOSIS — Z1212 Encounter for screening for malignant neoplasm of rectum: Secondary | ICD-10-CM

## 2014-12-24 LAB — HEPATIC FUNCTION PANEL
ALBUMIN: 4.3 g/dL (ref 3.6–5.1)
ALT: 24 U/L (ref 6–29)
AST: 24 U/L (ref 10–35)
Alkaline Phosphatase: 48 U/L (ref 33–130)
BILIRUBIN DIRECT: 0.1 mg/dL (ref <=0.2)
Indirect Bilirubin: 0.5 mg/dL (ref 0.2–1.2)
TOTAL PROTEIN: 7 g/dL (ref 6.1–8.1)
Total Bilirubin: 0.6 mg/dL (ref 0.2–1.2)

## 2014-12-24 LAB — CBC WITH DIFFERENTIAL/PLATELET
BASOS PCT: 0 % (ref 0–1)
Basophils Absolute: 0 10*3/uL (ref 0.0–0.1)
Eosinophils Absolute: 0.3 10*3/uL (ref 0.0–0.7)
Eosinophils Relative: 6 % — ABNORMAL HIGH (ref 0–5)
HCT: 40.7 % (ref 36.0–46.0)
HEMOGLOBIN: 13.4 g/dL (ref 12.0–15.0)
Lymphocytes Relative: 34 % (ref 12–46)
Lymphs Abs: 2 10*3/uL (ref 0.7–4.0)
MCH: 29.1 pg (ref 26.0–34.0)
MCHC: 32.9 g/dL (ref 30.0–36.0)
MCV: 88.5 fL (ref 78.0–100.0)
MONO ABS: 0.3 10*3/uL (ref 0.1–1.0)
MONOS PCT: 6 % (ref 3–12)
MPV: 10.3 fL (ref 8.6–12.4)
NEUTROS ABS: 3.1 10*3/uL (ref 1.7–7.7)
Neutrophils Relative %: 54 % (ref 43–77)
Platelets: 199 10*3/uL (ref 150–400)
RBC: 4.6 MIL/uL (ref 3.87–5.11)
RDW: 13.5 % (ref 11.5–15.5)
WBC: 5.8 10*3/uL (ref 4.0–10.5)

## 2014-12-24 LAB — LIPID PANEL
CHOLESTEROL: 225 mg/dL — AB (ref 125–200)
HDL: 51 mg/dL (ref >=46)
LDL Cholesterol: 151 mg/dL — ABNORMAL HIGH (ref <130)
Total CHOL/HDL Ratio: 4.4 Ratio (ref <=5.0)
Triglycerides: 116 mg/dL (ref <150)
VLDL: 23 mg/dL (ref <30)

## 2014-12-24 LAB — MAGNESIUM: MAGNESIUM: 2 mg/dL (ref 1.5–2.5)

## 2014-12-24 LAB — BASIC METABOLIC PANEL WITH GFR
BUN: 21 mg/dL (ref 7–25)
CALCIUM: 10.2 mg/dL (ref 8.6–10.4)
CO2: 27 mmol/L (ref 20–31)
CREATININE: 0.78 mg/dL (ref 0.50–1.05)
Chloride: 104 mmol/L (ref 98–110)
GFR, Est African American: >89 mL/min (ref >=60)
GFR, Est Non African American: 88 mL/min (ref >=60)
GLUCOSE: 79 mg/dL (ref 65–99)
Potassium: 4.1 mmol/L (ref 3.5–5.3)
SODIUM: 139 mmol/L (ref 135–146)

## 2014-12-24 LAB — IRON AND TIBC
%SAT: 30 % (ref 11–50)
IRON: 122 ug/dL (ref 45–160)
TIBC: 401 ug/dL (ref 250–450)
UIBC: 279 ug/dL (ref 125–400)

## 2014-12-24 LAB — HEMOGLOBIN A1C
HEMOGLOBIN A1C: 5.4 % (ref <5.7)
MEAN PLASMA GLUCOSE: 108 mg/dL (ref <117)

## 2014-12-24 LAB — VITAMIN B12: VITAMIN B 12: 953 pg/mL — AB (ref 211–911)

## 2014-12-24 LAB — TSH: TSH: 1.559 u[IU]/mL (ref 0.350–4.500)

## 2014-12-24 NOTE — Progress Notes (Signed)
Patient ID: Tanya Tucker, female   DOB: 1960/12/28, 54 y.o.   MRN: [id number]   Annual Screening/Preventative Visit and Comprehensive Evaluation & Examination  This very nice 54 y.o. MWF presents for presents for a Wellness/Preventative Visit & comprehensive evaluation and management of multiple medical co-morbidities.  Patient has been followed for HTN, Prediabetes/Insulin Resisrance, Hyperlipidemia and Vitamin D Deficiency.    HTN predates since 2008. Patient's BP has been controlled at home and patient denies any cardiac symptoms as chest pain, palpitations, shortness of breath, dizziness or ankle swelling. Today's BP: 138/84 mmHg    Patient's hyperlipidemia is controlled with diet and medications. Patient denies myalgias or other medication SE's. Last lipids were not at goal as patient is attempting dietary control and las lost 14 # over the last year but last year with elevated Cholesterol 221*; HDL 42*; LDL 142*; and with sl elevated Triglycerides 186 on 07/08/2014.   Patient has prediabetes/insulin resistance with A1c 5.4% and elevated insulin 42 in 2014 and patient denies reactive hypoglycemic symptoms, visual blurring, diabetic polys, or paresthesias. Last A1c was 5.6% on 07/08/2014.   Finally, patient has history of Vitamin D Deficiency of 19 in 2010 and last Vitamin D was 5863 in Nov 2015.  Medication Sig  . BABY ASPIRIN  Take 81 mg by mouth 2 (two) times daily.   . bisoprolol-hctz (ZIAC) 5-6.25 MG TAKE ONE TABLET BY MOUTH ONCE DAILY  . Calcium Carbonate  600 mg Take 600 mg by mouth daily.  Marland Kitchen. VITAMIN D  Take 10,000 Units by mouth daily.   Marland Kitchen. FLONASE nasal spray Place 2 sprays into both nostrils daily.  Marland Kitchen. lisinopril-hctz  10-12.5  Take 1 tablet by mouth daily.  Marland Kitchen. lithium carbonate 300 MG  Takes 1-2 pills daily  . Omega-3 FISH OIL Take by mouth daily.  . pravastatin40 MG tablet  Patient not taking: Reported on 12/24/2014   Allergies  Allergen Reactions  . Prednisolone    Past  Medical History  Diagnosis Date  . Bipolar disorder (HCC)   . Hemorrhoids   . Hypertension   . Vitamin D deficiency   . Insulin resistance    Health Maintenance  Topic Date Due  . Hepatitis C Screening  1960/12/28  . HIV Screening  12/18/1977  . PAP SMEAR  12/19/1983  . COLONOSCOPY  12/18/2012  . INFLUENZA VACCINE  09/02/2014  . MAMMOGRAM  11/18/2016  . TETANUS/TDAP  07/18/2017   Immunization History  Administered Date(s) Administered  . Influenza Split 10/24/2012, 12/10/2013  . PPD Test 12/10/2013  . Pneumococcal Polysaccharide-23 10/24/2012  . Tdap 07/19/2007   Past Surgical History  Procedure Laterality Date  . Tonsillectomy and adenoidectomy     Family History  Problem Relation Age of Onset  . Cataracts Mother   . Osteoporosis Mother   . Heart disease Father   . Hypertension Father   . Diabetes Father    Social History  Substance Use Topics  . Smoking status: Former Smoker    Quit date: 07/08/2011  . Smokeless tobacco: None     Comment: uses e-cigs  . Alcohol Use: 1.8 oz/week    3 Standard drinks or equivalent per week     Comment: "a few times a week"    ROS Constitutional: Denies fever, chills, weight loss/gain, headaches, insomnia,  night sweats, and change in appetite. Does c/o fatigue. Eyes: Denies redness, blurred vision, diplopia, discharge, itchy, watery eyes.  ENT: Denies discharge, congestion, post nasal drip, epistaxis, sore throat, earache, hearing loss, dental  pain, Tinnitus, Vertigo, Sinus pain, snoring.  Cardio: Denies chest pain, palpitations, irregular heartbeat, syncope, dyspnea, diaphoresis, orthopnea, PND, claudication, edema Respiratory: denies cough, dyspnea, DOE, pleurisy, hoarseness, laryngitis, wheezing.  Gastrointestinal: Denies dysphagia, heartburn, reflux, water brash, pain, cramps, nausea, vomiting, bloating, diarrhea, constipation, hematemesis, melena, hematochezia, jaundice, hemorrhoids Genitourinary: Denies dysuria, frequency,  urgency, nocturia, hesitancy, discharge, hematuria, flank pain Breast: Breast lumps, nipple discharge, bleeding.  Musculoskeletal: Denies arthralgia, myalgia, stiffness, Jt. Swelling, pain, limp, and strain/sprain. Denies falls. Skin: Denies puritis, rash, hives, warts, acne, eczema, changing in skin lesion Neuro: No weakness, tremor, incoordination, spasms, paresthesia, pain Psychiatric: Denies confusion, memory loss, sensory loss. Denies Depression. Endocrine: Denies change in weight, skin, hair change, nocturia, and paresthesia, diabetic polys, visual blurring, hyper / hypo glycemic episodes.  Heme/Lymph: No excessive bleeding, bruising, enlarged lymph nodes.  Physical Exam  BP 138/84 mmHg  Pulse 60  Temp(Src) 97 F (36.1 C)  Resp 16  Ht  (1.702 m)  Wt 180 lb (81.647 kg)  BMI 28.19 kg/m2  LMP 02/19/2014  General Appearance: Well nourished and in no apparent distress. Eyes: PERRLA, EOMs, conjunctiva no swelling or erythema, normal fundi and vessels. Sinuses: No frontal/maxillary tenderness ENT/Mouth: EACs patent / TMs  nl. Nares clear without erythema, swelling, mucoid exudates. Oral hygiene is good. No erythema, swelling, or exudate. Tongue normal, non-obstructing. Tonsils not swollen or erythematous. Hearing normal.  Neck: Supple, thyroid normal. No bruits, nodes or JVD. Respiratory: Respiratory effort normal.  BS equal and clear bilateral without rales, rhonci, wheezing or stridor. Cardio: Heart sounds are normal with regular rate and rhythm and no murmurs, rubs or gallops. Peripheral pulses are normal and equal bilaterally without edema. No aortic or femoral bruits. Chest: symmetric with normal excursions and percussion. Breasts: Symmetric, without lumps, nipple discharge, retractions, or fibrocystic changes.  Abdomen: Flat, soft, with bowl sounds. Nontender, no guarding, rebound, hernias, masses, or organomegaly.  Lymphatics: Non tender without lymphadenopathy.   Musculoskeletal: Full ROM all peripheral extremities, joint stability, 5/5 strength, and normal gait. Skin: Warm and dry without rashes, lesions, cyanosis, clubbing or  ecchymosis.  Neuro: Cranial nerves intact, reflexes equal bilaterally. Normal muscle tone, no cerebellar symptoms. Sensation intact.  Pysch: Awake and oriented X 3, normal affect, Insight and Judgment appropriate.   Assessment and Plan  1. Annual Preventative Screening Examination  - Microalbumin / creatinine urine ratio - EKG 12-Lead - [place], RETROPERITNL ABD,  LTD - POC Hemoccult Bld/Stl  - Urinalysis, Routine w reflex microscopic  - Vitamin B12 - Iron and TIBC - CBC with Differential/Platelet - BASIC METABOLIC PANEL WITH GFR - Hepatic function panel - Magnesium - Lipid panel - TSH - Hemoglobin A1c - Insulin, random - VITAMIN D 25 Hydroxy   2. Essential hypertension  - Microalbumin / creatinine urine ratio - EKG 12-Lead - [place], RETROPERITNL ABD,  LTD - TSH  3. Mixed hyperlipidemia  - Lipid panel - TSH  4. Insulin resistance  - Hemoglobin A1c - Insulin, random  5. Vitamin D deficiency  - VITAMIN D 25 Hydroxy   6. Screening for rectal cancer  - POC Hemoccult Bld/Stl   7. Other fatigue  - Vitamin B12 - Iron and TIBC  8. Need for prophylactic vaccination and inoculation against influenza  - Flu vaccine greater than or equal to 3yo preservative free IM  9. BMI 28.0-28.9,adult   10. Screening examination for pulmonary tuberculosis  - PPD  11. Medication management  - Urinalysis, Routine w reflex microscopic  - CBC with Differential/Platelet -  BASIC METABOLIC PANEL WITH GFR - Hepatic function panel - Magnesium   Continue prudent diet as discussed, weight control, BP monitoring, regular exercise, and medications. Discussed med's effects and SE's. Screening labs and tests as requested with regular follow-up as recommended.

## 2014-12-24 NOTE — Patient Instructions (Signed)
Recommend Adult Low Dose Aspirin or   coated  Aspirin 81 mg daily   To reduce risk of Colon Cancer 20 %,   Skin Cancer 26 % ,   Melanoma 46%   and   Pancreatic cancer 60%   ++++++++++++++++++++++++++++++++++++++++++++++++++++++  Vitamin D goal   is between 70-100.   Please make sure that you are taking your Vitamin D as directed.   It is very important as a natural anti-inflammatory   helping hair, skin, and nails, as well as reducing stroke and heart attack risk.   It helps your bones and helps with mood.  It also decreases numerous cancer risks so please take it as directed.   Low Vit D is associated with a 200-300% higher risk for CANCER   and 200-300% higher risk for HEART   ATTACK  &  STROKE.   ......................................  It is also associated with higher death rate at younger ages,   autoimmune diseases like Rheumatoid arthritis, Lupus, Multiple Sclerosis.     Also many other serious conditions, like depression, Alzheimer's  Dementia, infertility, muscle aches, fatigue, fibromyalgia - just to name a few.  ++++++++++++++++++++++++++++++++++++++++++++++++  Recommend the book "The END of DIETING" by Dr Joel Fuhrman   & the book "The END of DIABETES " by Dr Joel Fuhrman  At [employer] - get book & Audio CD's     Being diabetic has a  300% increased risk for heart attack, stroke, cancer, and alzheimer- type vascular dementia. It is very important that you work harder with diet by avoiding all foods that are white. Avoid white rice (brown & wild rice is OK), white potatoes (sweetpotatoes in moderation is OK), White bread or wheat bread or anything made out of white flour like bagels, donuts, rolls, buns, biscuits, cakes, pastries, cookies, pizza crust, and pasta (made from white flour & egg whites) - vegetarian pasta or spinach or wheat pasta is OK. Multigrain breads like Arnold's or [employer], or multigrain sandwich thins or flatbreads.  Diet,  exercise and weight loss can reverse and cure diabetes in the early stages.  Diet, exercise and weight loss is very important in the control and prevention of complications of diabetes which affects every system in your body, ie. Brain - dementia/stroke, eyes - glaucoma/blindness, heart - heart attack/heart failure, kidneys - dialysis, stomach - gastric paralysis, intestines - malabsorption, nerves - severe painful neuritis, circulation - gangrene & loss of a leg(s), and finally cancer and Alzheimers.    I recommend avoid fried & greasy foods,  sweets/candy, white rice (brown or wild rice or Quinoa is OK), white potatoes (sweet potatoes are OK) - anything made from white flour - bagels, doughnuts, rolls, buns, biscuits,white and wheat breads, pizza crust and traditional pasta made of white flour & egg white(vegetarian pasta or spinach or wheat pasta is OK).  Multi-grain bread is OK - like multi-grain flat bread or sandwich thins. Avoid alcohol in excess. Exercise is also important.    Eat all the vegetables you want - avoid meat, especially red meat and dairy - especially cheese.  Cheese is the most concentrated form of trans-fats which is the worst thing to clog up our arteries. Veggie cheese is OK which can be found in the fresh produce section at [employer] or [employer] or [employer]  ++++++++++++++++++++++++++++++++++++++++++++++++++ DASH Eating Plan  DASH stands for "Dietary Approaches to Stop Hypertension."   The DASH eating plan is a healthy eating plan that has been shown to reduce high   blood pressure (hypertension). Additional health benefits may include reducing the risk of type 2 diabetes mellitus, heart disease, and stroke. The DASH eating plan may also help with weight loss.  WHAT DO I NEED TO KNOW ABOUT THE DASH EATING PLAN? For the DASH eating plan, you will follow these general guidelines:  Choose foods with a percent daily value for sodium of less than 5% (as listed on the food  label).  Use salt-free seasonings or herbs instead of table salt or sea salt.  Check with your health care provider or pharmacist before using salt substitutes.  Eat lower-sodium products, often labeled as "lower sodium" or "no salt added."  Eat fresh foods.  Eat more vegetables, fruits, and low-fat dairy products.    Choose whole grains. Look for the word "whole" as the first word in the ingredient list.  Choose fish   Limit sweets, desserts, sugars, and sugary drinks.  Choose heart-healthy fats.  Eat veggie cheese   Eat more home-cooked food and less restaurant, buffet, and fast food.  Limit fried foods.  Cook foods using methods other than frying.  Limit canned vegetables. If you do use them, rinse them well to decrease the sodium.  When eating at a restaurant, ask that your food be prepared with less salt, or no salt if possible.                      WHAT FOODS CAN I EAT?  Seek help from a dietitian for individual calorie needs.  Grains Whole grain or whole wheat bread. Brown rice. Whole grain or whole wheat pasta. Quinoa, bulgur, and whole grain cereals. Low-sodium cereals. Corn or whole wheat flour tortillas. Whole grain cornbread. Whole grain crackers. Low-sodium crackers.  Vegetables Fresh or frozen vegetables (raw, steamed, roasted, or grilled). Low-sodium or reduced-sodium tomato and vegetable juices. Low-sodium or reduced-sodium tomato sauce and paste. Low-sodium or reduced-sodium canned vegetables.   Fruits All fresh, canned (in natural juice), or frozen fruits.  Protein Products  All fish and seafood.  Dried beans, peas, or lentils. Unsalted nuts and seeds. Unsalted canned beans.  Dairy Low-fat dairy products, such as skim or 1% milk, 2% or reduced-fat cheeses, low-fat ricotta or cottage cheese, or plain low-fat yogurt. Low-sodium or reduced-sodium cheeses.  Fats and Oils Tub margarines without trans fats. Light or reduced-fat mayonnaise and salad  dressings (reduced sodium). Avocado. Safflower, olive, or canola oils. Natural peanut or almond butter.  Other Unsalted popcorn and pretzels. The items listed above may not be a complete list of recommended foods or beverages. Contact your dietitian for more options.  +++++++++++++++++++++++++++++++++++++++++++  WHAT FOODS ARE NOT RECOMMENDED?  Grains/ White flour or wheat flour  White bread. White pasta. White rice. Refined cornbread. Bagels and croissants. Crackers that contain trans fat.  Vegetables  Creamed or fried vegetables. Vegetables in a . Regular canned vegetables. Regular canned tomato sauce and paste. Regular tomato and vegetable juices.  Fruits Dried fruits. Canned fruit in light or heavy syrup. Fruit juice.  Meat and Other Protein Products Meat in general. Fatty cuts of meat. Ribs, chicken wings, bacon, sausage, bologna, salami, chitterlings, fatback, hot dogs, bratwurst, and packaged luncheon meats.  Dairy Whole or 2% milk, cream, half-and-half, and cream cheese. Whole-fat or sweetened yogurt. Full-fat cheeses or blue cheese. Nondairy creamers and whipped toppings. Processed cheese, cheese spreads, or cheese curds.  Condiments Onion and garlic salt, seasoned salt, table salt, and sea salt. Canned and packaged gravies. Worcestershire sauce. Tartar sauce.  Barbecue sauce. Teriyaki sauce. Soy sauce, including reduced sodium. Steak sauce. Fish sauce. Oyster sauce. Cocktail sauce. Horseradish. Ketchup and mustard. Meat flavorings and tenderizers. Bouillon cubes. Hot sauce. Tabasco sauce. Marinades. Taco seasonings. Relishes.  Fats and Oils Butter, stick margarine, lard, shortening, ghee, and bacon fat. Coconut, palm kernel, or palm oils. Regular salad dressings.  Pickles and olives. Salted popcorn and pretzels. The items listed above may not be a complete list of foods and beverages to avoid.   Preventive Care for Adults  A healthy lifestyle and preventive care can  promote health and wellness. Preventive health guidelines for women include the following key practices.  A routine yearly physical is a good way to check with your health care provider about your health and preventive screening. It is a chance to share any concerns and updates on your health and to receive a thorough exam.  Visit your dentist for a routine exam and preventive care every 6 months. Brush your teeth twice a day and floss once a day. Good oral hygiene prevents tooth decay and gum disease.  The frequency of eye exams is based on your age, health, family medical history, use of contact lenses, and other factors. Follow your health care provider's recommendations for frequency of eye exams.  Eat a healthy diet. Foods like vegetables, fruits, whole grains, low-fat dairy products, and lean protein foods contain the nutrients you need without too many calories. Decrease your intake of foods high in solid fats, added sugars, and salt. Eat the right amount of calories for you.Get information about a proper diet from your health care provider, if necessary.  Regular physical exercise is one of the most important things you can do for your health. Most adults should get at least 150 minutes of moderate-intensity exercise (any activity that increases your heart rate and causes you to sweat) each week. In addition, most adults need muscle-strengthening exercises on 2 or more days a week.  Maintain a healthy weight. The body mass index (BMI) is a screening tool to identify possible weight problems. It provides an estimate of body fat based on height and weight. Your health care provider can find your BMI and can help you achieve or maintain a healthy weight.For adults 20 years and older:  A BMI below 18.5 is considered underweight.  A BMI of 18.5 to 24.9 is normal.  A BMI of 25 to 29.9 is considered overweight.  A BMI of 30 and above is considered obese.  Maintain normal blood lipids and  cholesterol levels by exercising and minimizing your intake of saturated fat. Eat a balanced diet with plenty of fruit and vegetables. Blood tests for lipids and cholesterol should begin at age 31 and be repeated every 5 years. If your lipid or cholesterol levels are high, you are over 50, or you are at high risk for heart disease, you may need your cholesterol levels checked more frequently.Ongoing high lipid and cholesterol levels should be treated with medicines if diet and exercise are not working.  If you smoke, find out from your health care provider how to quit. If you do not use tobacco, do not start.  Lung cancer screening is recommended for adults aged 83-80 years who are at high risk for developing lung cancer because of a history of smoking. A yearly low-dose CT scan of the lungs is recommended for people who have at least a 30-pack-year history of smoking and are a current smoker or have quit within the past 15 years.  A pack year of smoking is smoking an average of 1 pack of cigarettes a day for 1 year (for example: 1 pack a day for 30 years or 2 packs a day for 15 years). Yearly screening should continue until the smoker has stopped smoking for at least 15 years. Yearly screening should be stopped for people who develop a health problem that would prevent them from having lung cancer treatment.  High blood pressure causes heart disease and increases the risk of stroke. Your blood pressure should be checked at least every 1 to 2 years. Ongoing high blood pressure should be treated with medicines if weight loss and exercise do not work.  If you are 61-30 years old, ask your health care provider if you should take aspirin to prevent strokes.  Diabetes screening involves taking a blood sample to check your fasting blood sugar level. This should be done once every 3 years, after age 9, if you are within normal weight and without risk factors for diabetes. Testing should be considered at a  younger age or be carried out more frequently if you are overweight and have at least 1 risk factor for diabetes.  Breast cancer screening is essential preventive care for women. You should practice "breast self-awareness." This means understanding the normal appearance and feel of your breasts and may include breast self-examination. Any changes detected, no matter how small, should be reported to a health care provider. Women in their 74s and 30s should have a clinical breast exam (CBE) by a health care provider as part of a regular health exam every 1 to 3 years. After age 75, women should have a CBE every year. Starting at age 19, women should consider having a mammogram (breast X-ray test) every year. Women who have a family history of breast cancer should talk to their health care provider about genetic screening. Women at a high risk of breast cancer should talk to their health care providers about having an MRI and a mammogram every year.  Breast cancer gene (BRCA)-related cancer risk assessment is recommended for women who have family members with BRCA-related cancers. BRCA-related cancers include breast, ovarian, tubal, and peritoneal cancers. Having family members with these cancers may be associated with an increased risk for harmful changes (mutations) in the breast cancer genes BRCA1 and BRCA2. Results of the assessment will determine the need for genetic counseling and BRCA1 and BRCA2 testing.  Routine pelvic exams to screen for cancer are no longer recommended for nonpregnant women who are considered low risk for cancer of the pelvic organs (ovaries, uterus, and vagina) and who do not have symptoms. Ask your health care provider if a screening pelvic exam is right for you.  If you have had past treatment for cervical cancer or a condition that could lead to cancer, you need Pap tests and screening for cancer for at least 20 years after your treatment. If Pap tests have been discontinued, your  risk factors (such as having a new sexual partner) need to be reassessed to determine if screening should be resumed. Some women have medical problems that increase the chance of getting cervical cancer. In these cases, your health care provider may recommend more frequent screening and Pap tests.  Colorectal cancer can be detected and often prevented. Most routine colorectal cancer screening begins at the age of 23 years and continues through age 42 years. However, your health care provider may recommend screening at an earlier age if you have risk factors for colon  cancer. On a yearly basis, your health care provider may provide home test kits to check for hidden blood in the stool. Use of a small camera at the end of a tube, to directly examine the colon (sigmoidoscopy or colonoscopy), can detect the earliest forms of colorectal cancer. Talk to your health care provider about this at age 50, when routine screening begins. Direct exam of the colon should be repeated every 5-10 years through age 75 years, unless early forms of pre-cancerous polyps or small growths are found.  Hepatitis C blood testing is recommended for all people born from 1945 through 1965 and any individual with known risks for hepatitis C.  Pra  Osteoporosis is a disease in which the bones lose minerals and strength with aging. This can result in serious bone fractures or breaks. The risk of osteoporosis can be identified using a bone density scan. Women ages 65 years and over and women at risk for fractures or osteoporosis should discuss screening with their health care providers. Ask your health care provider whether you should take a calcium supplement or vitamin D to reduce the rate of osteoporosis.  Menopause can be associated with physical symptoms and risks. Hormone replacement therapy is available to decrease symptoms and risks. You should talk to your health care provider about whether hormone replacement therapy is right  for you.  Use sunscreen. Apply sunscreen liberally and repeatedly throughout the day. You should seek shade when your shadow is shorter than you. Protect yourself by wearing long sleeves, pants, a wide-brimmed hat, and sunglasses year round, whenever you are outdoors.  Once a month, do a whole body skin exam, using a mirror to look at the skin on your back. Tell your health care provider of new moles, moles that have irregular borders, moles that are larger than a pencil eraser, or moles that have changed in shape or color.  Stay current with required vaccines (immunizations).  Influenza vaccine. All adults should be immunized every year.  Tetanus, diphtheria, and acellular pertussis (Td, Tdap) vaccine. Pregnant women should receive 1 dose of Tdap vaccine during each pregnancy. The dose should be obtained regardless of the length of time since the last dose. Immunization is preferred during the 27th-36th week of gestation. An adult who has not previously received Tdap or who does not know her vaccine status should receive 1 dose of Tdap. This initial dose should be followed by tetanus and diphtheria toxoids (Td) booster doses every 10 years. Adults with an unknown or incomplete history of completing a 3-dose immunization series with Td-containing vaccines should begin or complete a primary immunization series including a Tdap dose. Adults should receive a Td booster every 10 years.  Varicella vaccine. An adult without evidence of immunity to varicella should receive 2 doses or a second dose if she has previously received 1 dose. Pregnant females who do not have evidence of immunity should receive the first dose after pregnancy. This first dose should be obtained before leaving the health care facility. The second dose should be obtained 4-8 weeks after the first dose.  Human papillomavirus (HPV) vaccine. Females aged 13-26 years who have not received the vaccine previously should obtain the 3-dose  series. The vaccine is not recommended for use in pregnant females. However, pregnancy testing is not needed before receiving a dose. If a female is found to be pregnant after receiving a dose, no treatment is needed. In that case, the remaining doses should be delayed until after the pregnancy. Immunization   is recommended for any person with an immunocompromised condition through the age of 26 years if she did not get any or all doses earlier. During the 3-dose series, the second dose should be obtained 4-8 weeks after the first dose. The third dose should be obtained 24 weeks after the first dose and 16 weeks after the second dose.  Zoster vaccine. One dose is recommended for adults aged 60 years or older unless certain conditions are present.  Measles, mumps, and rubella (MMR) vaccine. Adults born before 1957 generally are considered immune to measles and mumps. Adults born in 1957 or later should have 1 or more doses of MMR vaccine unless there is a contraindication to the vaccine or there is laboratory evidence of immunity to each of the three diseases. A routine second dose of MMR vaccine should be obtained at least 28 days after the first dose for students attending postsecondary schools, health care workers, or international travelers. People who received inactivated measles vaccine or an unknown type of measles vaccine during 1963-1967 should receive 2 doses of MMR vaccine. People who received inactivated mumps vaccine or an unknown type of mumps vaccine before 1979 and are at high risk for mumps infection should consider immunization with 2 doses of MMR vaccine. For females of childbearing age, rubella immunity should be determined. If there is no evidence of immunity, females who are not pregnant should be vaccinated. If there is no evidence of immunity, females who are pregnant should delay immunization until after pregnancy. Unvaccinated health care workers born before 1957 who lack laboratory  evidence of measles, mumps, or rubella immunity or laboratory confirmation of disease should consider measles and mumps immunization with 2 doses of MMR vaccine or rubella immunization with 1 dose of MMR vaccine.  Pneumococcal 13-valent conjugate (PCV13) vaccine. When indicated, a person who is uncertain of her immunization history and has no record of immunization should receive the PCV13 vaccine. An adult aged 19 years or older who has certain medical conditions and has not been previously immunized should receive 1 dose of PCV13 vaccine. This PCV13 should be followed with a dose of pneumococcal polysaccharide (PPSV23) vaccine. The PPSV23 vaccine dose should be obtained at least 8 weeks after the dose of PCV13 vaccine. An adult aged 19 years or older who has certain medical conditions and previously received 1 or more doses of PPSV23 vaccine should receive 1 dose of PCV13. The PCV13 vaccine dose should be obtained 1 or more years after the last PPSV23 vaccine dose.    Pneumococcal polysaccharide (PPSV23) vaccine. When PCV13 is also indicated, PCV13 should be obtained first. All adults aged 65 years and older should be immunized. An adult younger than age 65 years who has certain medical conditions should be immunized. Any person who resides in a nursing home or long-term care facility should be immunized. An adult smoker should be immunized. People with an immunocompromised condition and certain other conditions should receive both PCV13 and PPSV23 vaccines. People with human immunodeficiency virus (HIV) infection should be immunized as soon as possible after diagnosis. Immunization during chemotherapy or radiation therapy should be avoided. Routine use of PPSV23 vaccine is not recommended for American Indians, [place] Natives, or people younger than 65 years unless there are medical conditions that require PPSV23 vaccine. When indicated, people who have unknown immunization and have no record of immunization  should receive PPSV23 vaccine. One-time revaccination 5 years after the first dose of PPSV23 is recommended for people aged 19-64 years who have   chronic kidney failure, nephrotic syndrome, asplenia, or immunocompromised conditions. People who received 1-2 doses of PPSV23 before age 28 years should receive another dose of PPSV23 vaccine at age 64 years or later if at least 5 years have passed since the previous dose. Doses of PPSV23 are not needed for people immunized with PPSV23 at or after age 65 years.  Preventive Services / Frequency   Ages 52 to 76 years  Blood pressure check.  Lipid and cholesterol check.  Lung cancer screening. / Every year if you are aged 62-80 years and have a 30-pack-year history of smoking and currently smoke or have quit within the past 15 years. Yearly screening is stopped once you have quit smoking for at least 15 years or develop a health problem that would prevent you from having lung cancer treatment.  Clinical breast exam.** / Every year after age 38 years.  BRCA-related cancer risk assessment.** / For women who have family members with a BRCA-related cancer (breast, ovarian, tubal, or peritoneal cancers).  Mammogram.** / Every year beginning at age 69 years and continuing for as long as you are in good health. Consult with your health care provider.  Pap test.** / Every 3 years starting at age 29 years through age 47 or 54 years with a history of 3 consecutive normal Pap tests.  HPV screening.** / Every 3 years from ages 1 years through ages 108 to 42 years with a history of 3 consecutive normal Pap tests.  Fecal occult blood test (FOBT) of stool. / Every year beginning at age 36 years and continuing until age 71 years. You may not need to do this test if you get a colonoscopy every 10 years.  Flexible sigmoidoscopy or colonoscopy.** / Every 5 years for a flexible sigmoidoscopy or every 10 years for a colonoscopy beginning at age 65 years and continuing  until age 21 years.  Hepatitis C blood test.** / For all people born from [birth year] through 1965 and any individual with known risks for hepatitis C.  Skin self-exam. / Monthly.  Influenza vaccine. / Every year.  Tetanus, diphtheria, and acellular pertussis (Tdap/Td) vaccine.** / Consult your health care provider. Pregnant women should receive 1 dose of Tdap vaccine during each pregnancy. 1 dose of Td every 10 years.  Varicella vaccine.** / Consult your health care provider. Pregnant females who do not have evidence of immunity should receive the first dose after pregnancy.  Zoster vaccine.** / 1 dose for adults aged 69 years or older.  Pneumococcal 13-valent conjugate (PCV13) vaccine.** / Consult your health care provider.  Pneumococcal polysaccharide (PPSV23) vaccine.** / 1 to 2 doses if you smoke cigarettes or if you have certain conditions.  Meningococcal vaccine.** / Consult your health care provider.  Hepatitis A vaccine.** / Consult your health care provider.  Hepatitis B vaccine.** / Consult your health care provider. Screening for abdominal aortic aneurysm (AAA)  by ultrasound is recommended for people over 50 who have history of high blood pressure or who are current or former smokers.

## 2014-12-25 LAB — URINALYSIS, ROUTINE W REFLEX MICROSCOPIC
Bilirubin Urine: NEGATIVE
Glucose, UA: NEGATIVE
Hgb urine dipstick: NEGATIVE
KETONES UR: NEGATIVE
LEUKOCYTES UA: NEGATIVE
NITRITE: NEGATIVE
Protein, ur: NEGATIVE
SPECIFIC GRAVITY, URINE: 1.016 (ref 1.001–1.035)
pH: 5 (ref 5.0–8.0)

## 2014-12-25 LAB — MICROALBUMIN / CREATININE URINE RATIO
Creatinine, Urine: 93 mg/dL (ref 20–320)
Microalb Creat Ratio: 2 mcg/mg creat (ref <30)
Microalb, Ur: 0.2 mg/dL

## 2014-12-25 LAB — VITAMIN D 25 HYDROXY (VIT D DEFICIENCY, FRACTURES): VIT D 25 HYDROXY: 65 ng/mL (ref 30–100)

## 2014-12-25 LAB — INSULIN, RANDOM: Insulin: 6.1 u[IU]/mL (ref 2.0–19.6)

## 2014-12-31 LAB — TB SKIN TEST
INDURATION: 0 mm
TB SKIN TEST: NEGATIVE

## 2015-01-16 ENCOUNTER — Ambulatory Visit (INDEPENDENT_AMBULATORY_CARE_PROVIDER_SITE_OTHER): Payer: BLUE CROSS/BLUE SHIELD | Admitting: Internal Medicine

## 2015-01-16 ENCOUNTER — Encounter: Payer: Self-pay | Admitting: Internal Medicine

## 2015-01-16 VITALS — BP 122/80 | HR 82 | Temp 98.2°F | Resp 18 | Ht 67.0 in | Wt 172.0 lb

## 2015-01-16 DIAGNOSIS — M6248 Contracture of muscle, other site: Secondary | ICD-10-CM | POA: Diagnosis not present

## 2015-01-16 DIAGNOSIS — M62838 Other muscle spasm: Secondary | ICD-10-CM

## 2015-01-16 MED ORDER — CYCLOBENZAPRINE HCL 10 MG PO TABS: 10.0000 mg | ORAL_TABLET | Freq: Three times a day (TID) | ORAL | Status: DC | PRN

## 2015-01-16 MED ORDER — MELOXICAM 15 MG PO TABS: 15.0000 mg | ORAL_TABLET | Freq: Every day | ORAL | Status: DC

## 2015-01-16 NOTE — Progress Notes (Signed)
   Subjective:    Patient ID: Tanya Tucker, female    DOB: 1960/06/27, 54 y.o.   MRN: [id number]  Neck Pain  Associated symptoms include numbness (chronic tingling in hands not new). Pertinent negatives include no fever.  Patient presents to the office for evaluation of neck pain x 1 week.  She reports that she thinks that it may be coming from increased lifting at work.  She reports that she has intermittent pain that is dull and aching that feels tight and is worse with movement especially turning head to the right.  No previous neck pain.  NO injury.  She reports that she hasn't tried anything for the pain.   No loss of bowel or bladder, no new tingling, no saddle anesthesias, balance has been okay.     Review of Systems  Constitutional: Negative for fever, chills and fatigue.  Gastrointestinal: Negative for nausea and vomiting.  Musculoskeletal: Positive for neck pain.  Neurological: Positive for numbness (chronic tingling in hands not new).       Objective:   Physical Exam  Constitutional: She is oriented to person, place, and time. She appears well-developed and well-nourished. No distress.  HENT:  Head: Normocephalic.  Mouth/Throat: Oropharynx is clear and moist. No oropharyngeal exudate.  Eyes: Conjunctivae are normal. No scleral icterus.  Neck: Normal range of motion. Neck supple. No JVD present. Muscular tenderness present. No spinous process tenderness present. No rigidity. No edema, no erythema and normal range of motion present. No thyromegaly present.  Right trapezius muscle spasm  Cardiovascular: Normal rate, regular rhythm, normal heart sounds and intact distal pulses.  Exam reveals no gallop and no friction rub.   No murmur heard. Pulmonary/Chest: Effort normal and breath sounds normal. No respiratory distress. She has no wheezes. She has no rales. She exhibits no tenderness.  Musculoskeletal: Normal range of motion.  Lymphadenopathy:    She has no cervical  adenopathy.  Neurological: She is alert and oriented to person, place, and time.  Skin: Skin is warm and dry. She is not diaphoretic.  Psychiatric: She has a normal mood and affect. Her behavior is normal. Judgment and thought content normal.  Nursing note and vitals reviewed.   Filed Vitals:   01/16/15 1422  BP: 122/80  Pulse: 82  Temp: 98.2 F (36.8 C)  Resp: 18         Assessment & Plan:    1. Trapezius muscle spasm -heat -gentle stretching - cyclobenzaprine (FLEXERIL) 10 MG tablet; Take 1 tablet (10 mg total) by mouth every 8 (eight) hours as needed for muscle spasms.  Dispense: 30 tablet; Refill: 1 - meloxicam (MOBIC) 15 MG tablet; Take 1 tablet (15 mg total) by mouth daily.  Dispense: 14 tablet; Refill: 0

## 2015-01-16 NOTE — Patient Instructions (Signed)

## 2015-01-21 ENCOUNTER — Other Ambulatory Visit: Payer: Self-pay | Admitting: General Surgery

## 2015-01-21 NOTE — H&P (Signed)
Tanya Tucker 01/21/2015 11:44 AM Location: [facility] Patient #: [id number] DOB: [birth year] Married / Language: [place] / Race: White Female  History of Present Illness Romie Levee MD; 01/21/2015 11:54 AM) Patient words: hems.  The patient is a 54 year old female who presents with hemorrhoids. 54 year old female who presents to the office with a irritating external hemorrhoid. She states that over the past few years she has had more more difficulty with cleaning. She denies any itching or burning. She denies any rectal pain. Her bowel habits are somewhat irregular but she denies any straining. She is an occasional smoker.   Other Problems Michel Bickers, LPN; 95/62/1308 11:44 AM) Back Pain Chest pain Gastroesophageal Reflux Disease Hemorrhoids High blood pressure Hypercholesterolemia  Past Surgical History Michel Bickers, LPN; 65/78/4696 11:44 AM) Tonsillectomy  Diagnostic Studies History Michel Bickers, LPN; 29/52/8413 11:44 AM) Colonoscopy never Mammogram within last year Pap Smear 1-5 years ago  Allergies Michel Bickers, LPN; 24/40/1027 11:45 AM) No Known Allergies 01/21/2015  Medication History Michel Bickers, LPN; 25/36/6440 11:47 AM) Aspirin (  Tablet DR, Oral) Active. Ziac (5-6.25MG  Tablet, Oral) Active. Calcium 600 (1500 (600 Ca)MG Tablet, Oral) Active. Centrum Silver Adult 50+ (Oral) Active. Vitamin D (Cholecalciferol) (1000UNIT Capsule, Oral) Active. Iron (Ferrous Gluconate) (  Tablet, Oral) Active. Celery Seed Active. Fish Oil Burp-Less (  Capsule, Oral) Active. Lisinopril-Hydrochlorothiazide (10-12.5MG  Tablet, Oral) Active. Medications Reconciled  Social History Michel Bickers, LPN; 34/74/2595 11:44 AM) Alcohol use Moderate alcohol use. No caffeine use No drug use Tobacco use Current some day smoker.  Family History Michel Bickers, LPN; 63/87/5643 11:44 AM) Arthritis Mother. Bleeding  disorder Mother. Cerebrovascular Accident Father. Depression Father, Mother, Sister. Diabetes Mellitus Father. Heart Disease Father. Heart disease in female family member before age 20 Hypertension Father, Mother.  Pregnancy / Birth History Michel Bickers, LPN; 32/95/1884 11:44 AM) Age at menarche 15 years. Contraceptive History Oral contraceptives. Gravida 2 Irregular periods Maternal age 35-20 Para 2     Review of Systems Michel Bickers LPN; 16/60/6301 11:44 AM) General Present- Weight Loss. Not Present- Appetite Loss, Chills, Fatigue, Fever, Night Sweats and Weight Gain. Skin Present- Dryness. Not Present- Change in Wart/Mole, Hives, Jaundice, New Lesions, Non-Healing Wounds, Rash and Ulcer. HEENT Present- Earache, Sore Throat and Wears glasses/contact lenses. Not Present- Hearing Loss, Hoarseness, Nose Bleed, Oral Ulcers, Ringing in the Ears, Seasonal Allergies, Sinus Pain, Visual Disturbances and Yellow Eyes. Respiratory Not Present- Bloody sputum, Chronic Cough, Difficulty Breathing, Snoring and Wheezing. Breast Not Present- Breast Mass, Breast Pain, Nipple Discharge and Skin Changes. Cardiovascular Not Present- Chest Pain, Difficulty Breathing Lying Down, Leg Cramps, Palpitations, Rapid Heart Rate, Shortness of Breath and Swelling of Extremities. Gastrointestinal Present- Change in Bowel Habits. Not Present- Abdominal Pain, Bloating, Bloody Stool, Chronic diarrhea, Constipation, Difficulty Swallowing, Excessive gas, Gets full quickly at meals, Hemorrhoids, Indigestion, Nausea, Rectal Pain and Vomiting. Female Genitourinary Present- Pelvic Pain. Not Present- Frequency, Nocturia, Painful Urination and Urgency. Musculoskeletal Present- Back Pain and Joint Pain. Not Present- Joint Stiffness, Muscle Pain, Muscle Weakness and Swelling of Extremities. Neurological Present- Numbness and Tingling. Not Present- Decreased Memory, Fainting, Headaches, Seizures, Tremor, Trouble  walking and Weakness. Psychiatric Not Present- Anxiety, Bipolar, Change in Sleep Pattern, Depression, Fearful and Frequent crying. Endocrine Not Present- Cold Intolerance, Excessive Hunger, Hair Changes, Heat Intolerance, Hot flashes and New Diabetes. Hematology Not Present- Easy Bruising, Excessive bleeding, Gland problems, HIV and Persistent Infections.  Vitals Tresa Endo Dockery LPN; 60/11/9321 11:45 AM) 01/21/2015 11:44 AM Weight: 178 lb Height: 66.5in Body Surface Area: 1.91 m Body  Mass Index: 28.3 kg/m  Temp.: 97.37F(Oral)  Pulse: 79 (Regular)  BP: 122/78 (Sitting, Left Arm, Standard)      Physical Exam Romie Levee(Joaovictor Krone MD; 01/21/2015 11:58 AM)  General Mental Status-Alert. General Appearance-Not in acute distress. Build & Nutrition-Well nourished. Posture-Normal posture. Gait-Normal.  Head and Neck Head-normocephalic, atraumatic with no lesions or palpable masses. Trachea-midline.  Chest and Lung Exam Chest and lung exam reveals -on auscultation, normal breath sounds, no adventitious sounds and normal vocal resonance.  Cardiovascular Cardiovascular examination reveals -normal heart sounds, regular rate and rhythm with no murmurs.  Abdomen Inspection Inspection of the abdomen reveals - No Hernias. Palpation/Percussion Palpation and Percussion of the abdomen reveal - Soft, Non Tender, No Rigidity (guarding), No hepatosplenomegaly and No Palpable abdominal masses.  Neurologic Neurologic evaluation reveals -alert and oriented x 3 with no impairment of recent or remote memory, normal attention span and ability to concentrate, normal sensation and normal coordination.  Musculoskeletal Normal Exam - Bilateral-Upper Extremity Strength Normal and Lower Extremity Strength Normal.    Assessment & Plan Romie Levee(Saben Donigan MD; 01/21/2015 12:01 PM)  EXTERNAL HEMORRHOIDS WITH COMPLICATION (K64.4) Impression: 54 year old female who presents to the  office with complaints of external hemorrhoids which calls irritation when trying to keep clean. We discussed that a hemorrhoidectomy could be performed to excise these areas. We discussed that this can be very painful and that the pain can continue for 2 months. We discussed that topical medications are the best way to control pain and that controlling constipation would help as well. She is in agreement to this and understands this completely. We will get this scheduled for her as soon as possible.

## 2015-02-21 ENCOUNTER — Emergency Department (HOSPITAL_COMMUNITY)
Admission: EM | Admit: 2015-02-21 | Discharge: 2015-02-21 | Disposition: A | Discharge status: Other Acute Inpatient Hospital | Payer: BLUE CROSS/BLUE SHIELD | Attending: Emergency Medicine | Admitting: Emergency Medicine

## 2015-02-21 ENCOUNTER — Emergency Department (HOSPITAL_COMMUNITY): Payer: BLUE CROSS/BLUE SHIELD

## 2015-02-21 ENCOUNTER — Inpatient Hospital Stay (HOSPITAL_COMMUNITY)
Admission: AD | Admit: 2015-02-21 | Discharge: 2015-02-28 | DRG: 885 | Disposition: A | Discharge status: Home / Self Care | Payer: BLUE CROSS/BLUE SHIELD | Source: Intra-hospital | Attending: Psychiatry | Admitting: Psychiatry

## 2015-02-21 ENCOUNTER — Encounter (HOSPITAL_COMMUNITY): Payer: Self-pay

## 2015-02-21 ENCOUNTER — Encounter (HOSPITAL_COMMUNITY): Payer: Self-pay | Admitting: Emergency Medicine

## 2015-02-21 DIAGNOSIS — Y998 Other external cause status: Secondary | ICD-10-CM | POA: Insufficient documentation

## 2015-02-21 DIAGNOSIS — S199XXA Unspecified injury of neck, initial encounter: Secondary | ICD-10-CM | POA: Diagnosis not present

## 2015-02-21 DIAGNOSIS — F17200 Nicotine dependence, unspecified, uncomplicated: Secondary | ICD-10-CM | POA: Diagnosis not present

## 2015-02-21 DIAGNOSIS — F3163 Bipolar disorder, current episode mixed, severe, without psychotic features: Secondary | ICD-10-CM | POA: Diagnosis present

## 2015-02-21 DIAGNOSIS — T1491 Suicide attempt: Secondary | ICD-10-CM | POA: Diagnosis not present

## 2015-02-21 DIAGNOSIS — Y92410 Unspecified street and highway as the place of occurrence of the external cause: Secondary | ICD-10-CM | POA: Diagnosis not present

## 2015-02-21 DIAGNOSIS — F319 Bipolar disorder, unspecified: Secondary | ICD-10-CM | POA: Diagnosis present

## 2015-02-21 DIAGNOSIS — S3992XA Unspecified injury of lower back, initial encounter: Secondary | ICD-10-CM | POA: Insufficient documentation

## 2015-02-21 DIAGNOSIS — Z79899 Other long term (current) drug therapy: Secondary | ICD-10-CM | POA: Diagnosis not present

## 2015-02-21 DIAGNOSIS — X828XXA Other intentional self-harm by crashing of motor vehicle, initial encounter: Secondary | ICD-10-CM | POA: Diagnosis not present

## 2015-02-21 DIAGNOSIS — G4700 Insomnia, unspecified: Secondary | ICD-10-CM | POA: Diagnosis not present

## 2015-02-21 DIAGNOSIS — X822XXA Intentional collision of motor vehicle with tree, initial encounter: Secondary | ICD-10-CM | POA: Insufficient documentation

## 2015-02-21 DIAGNOSIS — F419 Anxiety disorder, unspecified: Secondary | ICD-10-CM | POA: Insufficient documentation

## 2015-02-21 DIAGNOSIS — IMO0002 Reserved for concepts with insufficient information to code with codable children: Secondary | ICD-10-CM

## 2015-02-21 DIAGNOSIS — F17210 Nicotine dependence, cigarettes, uncomplicated: Secondary | ICD-10-CM | POA: Diagnosis present

## 2015-02-21 DIAGNOSIS — F3130 Bipolar disorder, current episode depressed, mild or moderate severity, unspecified: Secondary | ICD-10-CM | POA: Diagnosis not present

## 2015-02-21 DIAGNOSIS — Y9389 Activity, other specified: Secondary | ICD-10-CM | POA: Diagnosis not present

## 2015-02-21 DIAGNOSIS — I10 Essential (primary) hypertension: Secondary | ICD-10-CM | POA: Insufficient documentation

## 2015-02-21 DIAGNOSIS — S81011A Laceration without foreign body, right knee, initial encounter: Secondary | ICD-10-CM | POA: Insufficient documentation

## 2015-02-21 DIAGNOSIS — Z8639 Personal history of other endocrine, nutritional and metabolic disease: Secondary | ICD-10-CM | POA: Insufficient documentation

## 2015-02-21 DIAGNOSIS — R45851 Suicidal ideations: Secondary | ICD-10-CM | POA: Diagnosis not present

## 2015-02-21 DIAGNOSIS — T1491XA Suicide attempt, initial encounter: Secondary | ICD-10-CM | POA: Insufficient documentation

## 2015-02-21 HISTORY — DX: Other intentional self-harm by crashing of motor vehicle, initial encounter: X82.8XXA

## 2015-02-21 LAB — CBC WITH DIFFERENTIAL/PLATELET
BASOS PCT: 0 %
Basophils Absolute: 0 10*3/uL (ref 0.0–0.1)
EOS ABS: 0.2 10*3/uL (ref 0.0–0.7)
EOS PCT: 2 %
HCT: 40.6 % (ref 36.0–46.0)
HEMOGLOBIN: 13.5 g/dL (ref 12.0–15.0)
LYMPHS ABS: 1.3 10*3/uL (ref 0.7–4.0)
Lymphocytes Relative: 16 %
MCH: 29.1 pg (ref 26.0–34.0)
MCHC: 33.3 g/dL (ref 30.0–36.0)
MCV: 87.5 fL (ref 78.0–100.0)
MONOS PCT: 6 %
Monocytes Absolute: 0.4 10*3/uL (ref 0.1–1.0)
NEUTROS PCT: 76 %
Neutro Abs: 6 10*3/uL (ref 1.7–7.7)
PLATELETS: 166 10*3/uL (ref 150–400)
RBC: 4.64 MIL/uL (ref 3.87–5.11)
RDW: 13.2 % (ref 11.5–15.5)
WBC: 7.8 10*3/uL (ref 4.0–10.5)

## 2015-02-21 LAB — RAPID URINE DRUG SCREEN, HOSP PERFORMED
Amphetamines: NOT DETECTED
BARBITURATES: NOT DETECTED
BENZODIAZEPINES: NOT DETECTED
COCAINE: NOT DETECTED
OPIATES: NOT DETECTED
Tetrahydrocannabinol: NOT DETECTED

## 2015-02-21 LAB — COMPREHENSIVE METABOLIC PANEL
ALBUMIN: 4.5 g/dL (ref 3.5–5.0)
ALT: 16 U/L (ref 14–54)
ANION GAP: 9 (ref 5–15)
AST: 22 U/L (ref 15–41)
Alkaline Phosphatase: 58 U/L (ref 38–126)
BUN: 19 mg/dL (ref 6–20)
CHLORIDE: 104 mmol/L (ref 101–111)
CO2: 26 mmol/L (ref 22–32)
Calcium: 10.1 mg/dL (ref 8.9–10.3)
Creatinine, Ser: 0.84 mg/dL (ref 0.44–1.00)
GFR calc non Af Amer: >60 mL/min (ref >60)
GLUCOSE: 102 mg/dL — AB (ref 65–99)
Potassium: 4.1 mmol/L (ref 3.5–5.1)
SODIUM: 139 mmol/L (ref 135–145)
Total Bilirubin: 1.3 mg/dL — ABNORMAL HIGH (ref 0.3–1.2)
Total Protein: 7.1 g/dL (ref 6.5–8.1)

## 2015-02-21 LAB — SALICYLATE LEVEL

## 2015-02-21 LAB — ETHANOL: Alcohol, Ethyl (B): <5 mg/dL (ref <5)

## 2015-02-21 LAB — ACETAMINOPHEN LEVEL

## 2015-02-21 MED ORDER — MAGNESIUM HYDROXIDE 400 MG/5ML PO SUSP: 30.0000 mL | Freq: Every day | ORAL | Status: DC | PRN

## 2015-02-21 MED ORDER — LIDOCAINE-EPINEPHRINE (PF) 2 %-1:200000 IJ SOLN
20.0000 mL | Freq: Once | INTRAMUSCULAR | Status: AC
  Administered 2015-02-21: 20 mL
  Filled 2015-02-21: qty 20

## 2015-02-21 MED ORDER — ACETAMINOPHEN 325 MG PO TABS: 650.0000 mg | ORAL_TABLET | Freq: Four times a day (QID) | ORAL | Status: DC | PRN

## 2015-02-21 MED ORDER — BACITRACIN 500 UNIT/GM EX OINT
1.0000 "application " | TOPICAL_OINTMENT | Freq: Two times a day (BID) | CUTANEOUS | Status: DC
  Administered 2015-02-22 – 2015-02-28 (×10): 1 via TOPICAL
  Filled 2015-02-21 (×18): qty 0.9

## 2015-02-21 MED ORDER — HYDROXYZINE HCL 25 MG PO TABS
25.0000 mg | ORAL_TABLET | Freq: Four times a day (QID) | ORAL | Status: DC | PRN
  Administered 2015-02-21 – 2015-02-23 (×2): 25 mg via ORAL
  Filled 2015-02-21 (×3): qty 1

## 2015-02-21 MED ORDER — ACETAMINOPHEN 325 MG PO TABS
650.0000 mg | ORAL_TABLET | ORAL | Status: DC | PRN
  Administered 2015-02-21: 650 mg via ORAL
  Filled 2015-02-21: qty 2

## 2015-02-21 MED ORDER — BACITRACIN ZINC 500 UNIT/GM EX OINT
1.0000 "application " | TOPICAL_OINTMENT | Freq: Two times a day (BID) | CUTANEOUS | Status: DC
  Administered 2015-02-21 (×2): 1 via TOPICAL
  Filled 2015-02-21: qty 0.9
  Filled 2015-02-21: qty 28.35

## 2015-02-21 MED ORDER — ALUM & MAG HYDROXIDE-SIMETH 200-200-20 MG/5ML PO SUSP: 30.0000 mL | ORAL | Status: DC | PRN

## 2015-02-21 NOTE — Progress Notes (Signed)
Pt attended evening AA group. 
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
## 2015-02-21 NOTE — BH Assessment (Signed)
[facility] Assessment Progress Note  Per Thedore Mins, MD, this pt requires psychiatric hospitalization at this time.  Thurman Coyer, RN, [facility] has assigned pt to [facility] Rm 305-2.  Pt has signed Voluntary Admission and Consent for Treatment, as well as Consent to Release Information to her provider at [facility], and a notification call has been placed.  Signed forms have been faxed to [facility].  Pt's nurse, Jan, has been notified, and agrees to send original paperwork along with pt via Juel Burrow, and to call report to [phone].  Doylene Canning, MA Triage Specialist [phone]

## 2015-02-21 NOTE — ED Notes (Addendum)
Pt care assumed.  Pt is resting comfortably, A&Ox 4.  Pt's spouse left and phone number given to him.  Pt denies any complaints at this time other than not being able to get some rest.  Water given to pt as requested. Sitter at bedsid
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
## 2015-02-21 NOTE — ED Notes (Signed)
Tele psych in room. 
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
## 2015-02-21 NOTE — ED Notes (Signed)
Pt to xray for knee xray
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
## 2015-02-21 NOTE — ED Notes (Addendum)
Teleassessment by [facility] counselor currently taking place

## 2015-02-22 ENCOUNTER — Encounter (HOSPITAL_COMMUNITY): Payer: Self-pay | Admitting: Psychiatry

## 2015-02-22 DIAGNOSIS — R45851 Suicidal ideations: Secondary | ICD-10-CM

## 2015-02-22 DIAGNOSIS — F3130 Bipolar disorder, current episode depressed, mild or moderate severity, unspecified: Secondary | ICD-10-CM

## 2015-02-22 LAB — LIPID PANEL
CHOL/HDL RATIO: 4.6 ratio
Cholesterol: 237 mg/dL — ABNORMAL HIGH (ref 0–200)
HDL: 51 mg/dL (ref >40)
LDL CALC: 171 mg/dL — AB (ref 0–99)
Triglycerides: 73 mg/dL (ref <150)
VLDL: 15 mg/dL (ref 0–40)

## 2015-02-22 LAB — T4, FREE: FREE T4: 0.86 ng/dL (ref 0.61–1.12)

## 2015-02-22 LAB — TSH: TSH: 1.54 u[IU]/mL (ref 0.350–4.500)

## 2015-02-22 LAB — LITHIUM LEVEL: LITHIUM LVL: 0.1 mmol/L — AB (ref 0.60–1.20)

## 2015-02-22 MED ORDER — OMEGA-3-ACID ETHYL ESTERS 1 G PO CAPS
1.0000 g | ORAL_CAPSULE | Freq: Two times a day (BID) | ORAL | Status: DC
  Administered 2015-02-23 – 2015-02-24 (×3): 1 g via ORAL
  Filled 2015-02-22 (×7): qty 1

## 2015-02-22 MED ORDER — LITHIUM CARBONATE 300 MG PO CAPS
300.0000 mg | ORAL_CAPSULE | Freq: Two times a day (BID) | ORAL | Status: DC
  Administered 2015-02-22 – 2015-02-24 (×4): 300 mg via ORAL
  Filled 2015-02-22 (×9): qty 1

## 2015-02-22 MED ORDER — LISINOPRIL 10 MG PO TABS
10.0000 mg | ORAL_TABLET | Freq: Every day | ORAL | Status: DC
  Administered 2015-02-22 – 2015-02-28 (×7): 10 mg via ORAL
  Filled 2015-02-22 (×7): qty 1
  Filled 2015-02-22: qty 2
  Filled 2015-02-22 (×2): qty 1

## 2015-02-22 MED ORDER — TRAZODONE HCL 50 MG PO TABS
50.0000 mg | ORAL_TABLET | Freq: Every evening | ORAL | Status: DC | PRN
  Administered 2015-02-22 – 2015-02-25 (×4): 50 mg via ORAL
  Filled 2015-02-22 (×4): qty 1

## 2015-02-22 MED ORDER — HYDROCHLOROTHIAZIDE 12.5 MG PO CAPS
12.5000 mg | ORAL_CAPSULE | Freq: Every day | ORAL | Status: DC
  Administered 2015-02-22 – 2015-02-28 (×7): 12.5 mg via ORAL
  Filled 2015-02-22 (×10): qty 1
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
## 2015-02-22 NOTE — [facility] Group Notes (Signed)
[facility] Group Notes:  (Clinical Social Work)   02/22/2015 1:15-2:15PM  Summary of Progress/Problems:   Today's process group involved patients discussing their feelings related to being hospitalized, as well as how they can use their present feelings to create a goal for the day.  A lengthy list of unhealthy coping techniques was discussed and group was able to state the connection between having a need and using a coping technique to feel that need, sometimes unhealthy and sometimes healthy.  Motivational interviewing was used to bring about a discussion about making choices about the unhealthy coping skills identified by each patient.   The patient was late to group and listened attentively, was very quiet and seemed deeply depressed.   Type of Therapy:  Group Therapy - Process   Participation Level:  Active  Participation Quality:  Attentive  Affect:  Blunted and Depressed  Cognitive:  Appropriate and Oriented  Insight:  Developing/Improving  Engagement in Therapy:  Engaged  Modes of Intervention:  Discussion, Motivational Interviewing  Ambrose Mantle, LCSW 02/22/2015, 5:18 PM
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
## 2015-02-22 NOTE — [facility] Group Notes (Signed)
[facility] Group Notes:  (Nursing/MHT/Case Management/Adjunct)  Date:  02/22/2015  Time:  1000  Type of Therapy:  Nurse Education  Life Skills  /  The group is focused on teaching patients how to identify their needs and then identify ways to get their needs met.  Participation Level:  Active  Participation Quality:  Attentive  Affect:  Appropriate  Cognitive:  Appropriate  Insight:  Good  Engagement in Group:  Engaged  Modes of Intervention:  Education  Summary of Progress/Problems:  Lauralyn Primes 02/22/2015, 4:43 PM
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
## 2015-02-22 NOTE — Progress Notes (Signed)
Pt has been in her room in bed for most of the evening.  When writer went to her room for assessment, she stated that she went to an afternoon group, and felt that it did not pertain to her MH issues.  She does not feel that she needs to be here.  Writer assured her that [facility] had a program for needs and that she probably needed to program on another hall.  Pt says she is still having passive suicidal thoughts and feeling hopeless, but contracts for safety.  She denies HI/AVH.  She appeared to be tired and lethargic, but requested something to help her sleep.  She was informed that Vistaril was available and was given 25 mg at 2121.  She came back to the nurse's station shortly after that asking for her night medications.  When writer informed her that she had already received hs med, she asked about Lithium.  Pt was referred to the MD in the AM as this medication has not been reordered.  Pt voiced understanding.  Support and encouragement offered.  Pt makes her needs known.  Discharge plans are in process.  Safety maintained with q15 minute checks.

## 2015-02-22 NOTE — Progress Notes (Signed)
D.  Pt pleasant on approach, denies complaints at this time.  Positive for wrap up group on 400 hall tonight.  Interacting appropriately with peers on the unit.  Requested to take sleep medication tonight, states she has not taken it before but has been sleeping poorly.  Denies SI/HI/hallucinations at this time.  A.  Support and encouragement offered  R.  Pt remains safe on the unit, will continue to monitor.

## 2015-02-22 NOTE — Progress Notes (Signed)
Pt attended wrap up group on the 400 hall.  

## 2015-02-23 NOTE — [facility] Group Notes (Signed)
[facility] Group Notes:  (Nursing/MHT/Case Management/Adjunct)  Date:  02/23/2015  Time:  1000  Type of Therapy:  Nurse Education  /  Life SKills : The group is focused on teaching patients how to develop healthy support systems.  Participation Level:  Minimal  Participation Quality:  Attentive  Affect:  Blunted  Cognitive:  Appropriate  Insight:  Appropriate  Engagement in Group:  Engaged  Modes of Intervention:  Education  Summary of Progress/Problems:  Rich Brave 02/23/2015, 12:25 PM

## 2015-02-23 NOTE — [facility] Group Notes (Signed)
[facility] Group Notes:  (Clinical Social Work)  02/23/2015  1:15-2:30PM  Summary of Progress/Problems:   The main focus of today's process group was to   1)  discuss the importance of adding supports  2)  define health supports versus unhealthy supports  3)  identify the patient's current unhealthy supports and plan how to handle them  4)  Identify the patient's current healthy supports and plan what to add.  An emphasis was placed on using counselor, doctor, therapy groups, 12-step groups, and problem-specific support groups to expand supports.    The patient expressed full comprehension of the concepts presented, and agreed that there is a need to add more supports.  The patient stated she would like people in her family to understand more about what it is like to live with bipolar disorder.  Type of Therapy:  Process Group with Motivational Interviewing  Participation Level:  Active  Participation Quality:  Attentive  Affect:  Blunted  Cognitive:  Appropriate  Insight:  Developing/Improving  Engagement in Therapy:  Engaged  Modes of Intervention:   Education, Support and Processing, Activity  Ambrose Mantle, LCSW 02/23/2015

## 2015-02-23 NOTE — Progress Notes (Signed)
D.  Pt pleasant on approach, denies complaints at this time other than insomnia.  Pt wants to wait until later tonight to take her Trazodone then see if she needs to have a repeat dose.  Positive for evening wrap up group (Pt programs on 400 hall), interacting appropriately within milieu.  Denies SI/HI/hallucinations at this time.  A.  Support and encouragement offered  R.  Pt remains safe on the unit, will continue to monitor.
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
## 2015-02-23 NOTE — Progress Notes (Signed)
Patient attended the evening wrap up group on the 400 hall.   

## 2015-02-23 NOTE — Progress Notes (Signed)
D) Pt has been attending the groups and interacting minimally with her peers. Pt reports that she is still not feeling herself. Worried about how they are going to increased the lithium and that it is not being done fast enough for her. Affect is flat and mood remains depressed. Rates her depression at an 8, hopelessness at a 4 and her anxiety at a 4. Will often appear lost on the unit. Lacking in spontaneous response. Appears fearful. A) Given support and reassurance. Provided with a 1:1. Educated about lithium and how it is given and how blood must be tested. Assured that she would not be kept in the hospital any longer than she might need to be.  R) Pt denies SI and HI.

## 2015-02-24 ENCOUNTER — Encounter (HOSPITAL_COMMUNITY): Payer: Self-pay | Admitting: Psychiatry

## 2015-02-24 LAB — HEMOGLOBIN A1C
HEMOGLOBIN A1C: 5.7 % — AB (ref 4.8–5.6)
MEAN PLASMA GLUCOSE: 117 mg/dL

## 2015-02-24 LAB — PROLACTIN: Prolactin: 11.4 ng/mL (ref 4.8–23.3)

## 2015-02-24 MED ORDER — QUETIAPINE FUMARATE 100 MG PO TABS
100.0000 mg | ORAL_TABLET | Freq: Every day | ORAL | Status: DC
  Administered 2015-02-24 – 2015-02-25 (×2): 100 mg via ORAL
  Filled 2015-02-24 (×6): qty 1

## 2015-02-24 MED ORDER — LORAZEPAM 1 MG PO TABS: 1.0000 mg | ORAL_TABLET | Freq: Four times a day (QID) | ORAL | Status: DC | PRN

## 2015-02-24 MED ORDER — LORAZEPAM 1 MG PO TABS
1.0000 mg | ORAL_TABLET | ORAL | Status: DC | PRN
  Administered 2015-02-24 – 2015-02-26 (×3): 1 mg via ORAL
  Filled 2015-02-24 (×3): qty 1

## 2015-02-24 MED ORDER — LITHIUM CARBONATE 300 MG PO CAPS
300.0000 mg | ORAL_CAPSULE | Freq: Three times a day (TID) | ORAL | Status: DC
  Administered 2015-02-24 – 2015-02-27 (×8): 300 mg via ORAL
  Filled 2015-02-24 (×11): qty 1

## 2015-02-24 MED ORDER — OMEGA-3-ACID ETHYL ESTERS 1 G PO CAPS
1.0000 g | ORAL_CAPSULE | Freq: Three times a day (TID) | ORAL | Status: DC
  Administered 2015-02-24 – 2015-02-28 (×12): 1 g via ORAL
  Filled 2015-02-24 (×17): qty 1
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
## 2015-02-24 NOTE — [facility] Suicide Risk Assessment (Signed)
[facility] INPATIENT:  Family/Significant Other Suicide Prevention Education  Suicide Prevention Education:  Education Completed; husband Vanna Sailer [phone],  (name of family member/significant other) has been identified by the patient as the family member/significant other with whom the patient will be residing, and identified as the person(s) who will aid the patient in the event of a mental health crisis (suicidal ideations/suicide attempt).  With written consent from the patient, the family member/significant other has been provided the following suicide prevention education, prior to the and/or following the discharge of the patient.  The suicide prevention education provided includes the following:  Suicide risk factors  Suicide prevention and interventions  National Suicide Hotline telephone number  [place] assessment telephone number  [facility] Emergency Assistance 911  [facility] and/or Residential Mobile Crisis Unit telephone number  Request made of family/significant other to:  Remove weapons (e.g., guns, rifles, knives), all items previously/currently identified as safety concern.    Remove drugs/medications (over-the-counter, prescriptions, illicit drugs), all items previously/currently identified as a safety concern.  The family member/significant other verbalizes understanding of the suicide prevention education information provided.  The family member/significant other agrees to remove the items of safety concern listed above.  Jacquelynne Guedes, [place] 02/24/2015, 11:12 AM

## 2015-02-24 NOTE — [facility] Group Notes (Signed)
   [facility] LCSW Aftercare Discharge Planning Group Note  02/24/2015  8:45 AM   Participation Quality: Alert, Appropriate and Oriented  Mood/Affect: Depressed and anxious  Depression Rating: 10  Anxiety Rating: 10  Thoughts of Suicide: Pt denies SI/HI  Will you contract for safety? Yes  Current AVH: Pt denies  Plan for Discharge/Comments: Pt attended discharge planning group and actively participated in group. CSW provided pt with today's workbook. Patient plans to return home to follow up with outpatient services.  Transportation Means: Pt reports access to transportation  Supports: No supports mentioned at this time  Samuella Bruin, MSW, [employer] Clinical Social Worker [employer] [phone]

## 2015-02-24 NOTE — Plan of Care (Signed)
Problem: Ineffective individual coping Goal: STG: Patient will remain free from self harm Outcome: Progressing Amirrah remains free from self harm at this time

## 2015-02-24 NOTE — Progress Notes (Signed)
Patient ID: Tanya Tucker, female   DOB: [birth year], 55 y.o.   MRN: [id number]  DAR: Pt. Denies SI/HI and A/V Hallucinations. She reports sleep last night was poor, appetite is fair, energy level is low, and concentration is poor. She rates depression 5/10, hopelessness 5/10, and anxiety 8/10. Patient does report minimal pain in right knee however refuses intervention at this time. Support and encouragement provided to the patient. Scheduled medications administered to patient per physician's orders. Patient is receptive and cooperative. She is seen in the milieu attending groups and walking up and down the halls. She reports that she is thinking about changing her mood stabilization medication but is not sure whether or not she wants to because she is scared of side effects. EKG performed and patient tolerated well. Q15 minute checks are maintained for safety.

## 2015-02-24 NOTE — Progress Notes (Signed)
Adult Psychoeducational Group Note  Date:  02/24/2015 Time:  11:42 PM  Group Topic/Focus:  Wrap-Up Group:   The focus of this group is to help patients review their daily goal of treatment and discuss progress on daily workbooks.  Participation Level:  Active  Participation Quality:  Appropriate and Sharing  Affect:  Appropriate  Cognitive:  Alert and Appropriate  Insight: Appropriate  Engagement in Group:  Engaged  Modes of Intervention:  Discussion  Additional Comments:  Pt shared she didn't sleep well last night. Pt also shared she took Ativan for the first time today and it worked great for her. Rated day a 10 and wants to work on a discharge plan.  Burman Freestone 02/24/2015, 11:42 PM

## 2015-02-24 NOTE — Plan of Care (Signed)
Problem: Alteration in mood Goal: LTG-Patient reports reduction in suicidal thoughts (Patient reports reduction in suicidal thoughts and is able to verbalize a safety plan for whenever patient is feeling suicidal)  Outcome: Progressing Pt denies suicidal ideation     

## 2015-02-24 NOTE — Progress Notes (Signed)
Recreation Therapy Notes  Date: 01.23.2017 Time: 9:30am Location: 300 Hall Group Room   Group Topic: Stress Management  Goal Area(s) Addresses:  Patient will actively participate in stress management techniques presented during session.   Behavioral Response: Appropriate, Engaged.   Intervention: Stress management techniques  Activity :  Deep Breathing and Progressive Muscle Relaxation. LRT provided instruction and demonstration on practice of Progressive Muscle Relaxation. Technique was coupled with deep breathing.   Education:  Stress Management, Discharge Planning.   Education Outcome: Acknowledges education  Clinical Observations/Feedback: Patient actively engaged in technique introduced, expressed no concerns and demonstrated ability to practice independently post d/c. Additionally patient asked questions for clarification during participating in activity so as to understand if she was practicing technique appropriately.   Marykay Lex Tamirah George, LRT/CTRS  Jearl Klinefelter 02/24/2015 3:34 PM

## 2015-02-25 NOTE — Plan of Care (Signed)
Problem: Alteration in mood Goal: LTG-Pt's behavior demonstrates decreased signs of depression (Patient's behavior demonstrates decreased signs of depression to the point the patient is safe to return home and continue treatment in an outpatient setting)  Outcome: Progressing Patient reports decreased depressive symptoms.  Rates her depression as a 3 today.

## 2015-02-25 NOTE — [facility] Group Notes (Addendum)
Late Entry from 02/24/15:  [facility] LCSW Group Therapy 02/24/15  1:15 pm  Type of Therapy: Group Therapy Participation Level: Active  Participation Quality: Attentive, Sharing and Supportive  Affect: Anxious  Cognitive: Alert and Oriented  Insight: Developing/Improving and Engaged  Engagement in Therapy: Developing/Improving and Engaged  Modes of Intervention: Clarification, Confrontation, Discussion, Education, Exploration,  Limit-setting, Orientation, Problem-solving, Rapport Building, Dance movement psychotherapist, Socialization and Support   Summary of Progress/Problems: Pt identified obstacles faced currently and processed barriers involved in overcoming these obstacles. Pt identified steps necessary for overcoming these obstacles and explored motivation (internal and external) for facing these difficulties head on. Pt further identified one area of concern in their lives and chose a goal to focus on for today. Patient identified her lack of sleep, difficulty concentrating, and medication noncompliance as obstacles. CSW and other group members provided patient with emotional support and encouragement.   Samuella Bruin, MSW, [employer] Clinical Social Worker [facility] [phone]

## 2015-02-25 NOTE — Progress Notes (Signed)
Adult Psychoeducational Group Note  Date:  02/25/2015 Time:  9:32 PM  Group Topic/Focus:  Wrap-Up Group:   The focus of this group is to help patients review their daily goal of treatment and discuss progress on daily workbooks.  Participation Level:  Active  Participation Quality:  Appropriate  Affect:  Appropriate  Cognitive:  Alert  Insight: Appropriate  Engagement in Group:  Engaged  Modes of Intervention:  Discussion  Additional Comments:  Patient stated having an okay day. Something positive that happened today was "being able to go outside for fresh air, and being able to read the bible."  Tasha Jindra L Tekoa Amon 02/25/2015, 9:32 PM

## 2015-02-25 NOTE — [facility] Group Notes (Signed)
[facility] LCSW Group Therapy  02/25/2015 1:23 PM  Type of Therapy:  Group Therapy  Participation Level:  Active  Participation Quality:  Attentive  Affect:  Appropriate  Cognitive:  Alert and Oriented  Insight:  Improving  Engagement in Therapy:  Improving  Modes of Intervention:  Discussion, Education, Exploration, Limit-setting, Problem-solving, Socialization and Support  Summary of Progress/Problems: MHA Speaker came to talk about his personal journey with substance abuse and addiction. The pt processed ways by which to relate to the speaker. MHA speaker provided handouts and educational information pertaining to groups and services offered by the [facility].   Smart, Durward Matranga LCSW 02/25/2015, 1:23 PM

## 2015-02-25 NOTE — Progress Notes (Signed)
D: Patient observed ambulating in hall with husband. Patient with bright affect. Patient talked with this Clinical research associate; states her goal was to "go to sleep tonight." Patient observed resting in bed with eyes closed. Respirations even and non labored no distress noted. A: Medications administered as ordered and effects monitored. Support and encouragement provided to patient. Q 15 minute checks in progress and maintained for safety. Patient denies SI/HI A/V hallucinations. R: Patient resting in bed. Patient remains safe on unit. Monitoring continues.
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
## 2015-02-25 NOTE — Progress Notes (Signed)
D: Patient attended group this morning.  Her goal is to "focus on her plans for discharge."  She rates her depression, hopelessness and anxiety as a 3.  She states she is sleeping and eating well.  She hopes to have a good support system upon discharge.  She denies SI/HI/AVH. A: Continue to monitor medication management and MD orders.  Safety checks completed every 15 minutes per protocol.  Offer support and encouragement as needed. R: Patient is receptive to staff; her behavior is appropriate.

## 2015-02-25 NOTE — Progress Notes (Signed)
Pt attended spiritual care group on grief and loss facilitated by chaplain Signe Colt attended group on 400 hall today.    Group opened with brief discussion and psycho-social ed around grief and loss in relationships and in relation to self - identifying life patterns, circumstances, changes that cause losses. Established group norm of speaking from own life experience. Group goal of establishing open and affirming space for members to share loss and experience with grief, normalize grief experience and provide psycho social education and grief support.

## 2015-02-25 NOTE — [facility] Group Notes (Signed)
[facility] Group Notes:  (Nursing/MHT/Case Management/Adjunct)  Date:  02/25/2015  Time:  0845 am  Type of Therapy:  Psychoeducational Skills  Participation Level:  Minimal  Participation Quality:  Appropriate  Affect:  Anxious  Cognitive:  Alert and Appropriate  Insight:  Improving  Engagement in Group:  Developing/Improving  Modes of Intervention:  Support  Summary of Progress/Problems: Tanya Tucker want to work on her discharge plan; have things in place, especially support systems.  Cranford Mon 02/25/2015, 9:02 AM

## 2015-02-26 MED ORDER — LITHIUM CARBONATE ER 300 MG PO TBCR
300.0000 mg | EXTENDED_RELEASE_TABLET | Freq: Two times a day (BID) | ORAL | Status: DC
  Administered 2015-02-27 – 2015-02-28 (×4): 300 mg via ORAL
  Filled 2015-02-26 (×7): qty 1

## 2015-02-26 MED ORDER — LITHIUM CARBONATE ER 300 MG PO TBCR: 300.0000 mg | EXTENDED_RELEASE_TABLET | Freq: Two times a day (BID) | ORAL | Status: DC

## 2015-02-26 MED ORDER — QUETIAPINE FUMARATE 50 MG PO TABS
150.0000 mg | ORAL_TABLET | Freq: Every day | ORAL | Status: DC
  Administered 2015-02-26 – 2015-02-27 (×2): 150 mg via ORAL
  Filled 2015-02-26 (×4): qty 1

## 2015-02-26 MED ORDER — LITHIUM CARBONATE ER 300 MG PO TBCR
600.0000 mg | EXTENDED_RELEASE_TABLET | Freq: Every day | ORAL | Status: DC
  Administered 2015-02-26 – 2015-02-27 (×2): 600 mg via ORAL
  Filled 2015-02-26 (×4): qty 2

## 2015-02-26 NOTE — [facility] Group Notes (Signed)
   [facility] LCSW Aftercare Discharge Planning Group Note  02/26/2015  8:45 AM   Participation Quality: Alert, Appropriate and Oriented  Mood/Affect: Depressed, anxious  Depression Rating: 8  Anxiety Rating: Reports high anxiety   Thoughts of Suicide: Pt denies SI/HI  Will you contract for safety? Yes  Current AVH: Pt denies  Plan for Discharge/Comments: Pt attended discharge planning group and actively participated in group. CSW provided pt with today's workbook. Patient reports that she slept for approximately 5 hours last night but continues to experience high anxiety and difficulty concentrating. She will return home with outpatient services at discharge.  Transportation Means: Pt reports access to transportation  Supports: No supports mentioned at this time  Samuella Bruin, MSW, [employer] Clinical Social Worker [employer] [phone]

## 2015-02-26 NOTE — Progress Notes (Signed)
Pt has been observed sitting in the dayroom most of the evening.  She attended evening group on the 400 hall.  Pt reports she is anxious about when another Lithium level will be drawn.  She says she is feeling anxious this evening and hopeful for a good night sleep.  Pt denies SI/HI/AVH.  She plans to return home at discharge.  Pt voiced no other needs or concerns.  Writer informed pt that a Lithium level would be ordered for her sometime prior to discharge.  Pt has been pleasant and cooperative on the unit.  Support and encouragement offered.  Meds given as ordered.  Safety maintained with q15 minute checks.

## 2015-02-26 NOTE — Progress Notes (Signed)
Patient ID: Tanya Tucker, female   DOB: 01-20-1961, 55 y.o.   MRN: [id number]  DAR: Pt. Denies SI/HI and A/V Hallucinations. She reports sleep last night was fair, appetite is fair, energy level is normal, and concentration is poor. She rates depression 7/10, anxiety 3/10, and hopelessness 3/10. Patient reports pain in knee 4/10 and heat pack was given. Support and encouragement provided to the patient. Scheduled medications administered to patient per physician's orders. Patient is receptive and cooperative but remains anxious. PRN Ativan given to patient which provided relief. She is seen in the milieu walking the halls and seen in the dayroom but mainly stays to herself. Q15 minute checks are maintained for safety.

## 2015-02-26 NOTE — Plan of Care (Signed)
Problem: Diagnosis: Increased Risk For Suicide Attempt Goal: STG-Patient Will Comply With Medication Regime Outcome: Progressing Patient is still apprehensive about her medications but takes them as scheduled.

## 2015-02-26 NOTE — [facility] Group Notes (Signed)
[facility] LCSW Group Therapy 02/26/2015  1:15 PM Type of Therapy: Group Therapy Participation Level: Active  Participation Quality: Attentive, Sharing and Supportive  Affect: Appropriate  Cognitive: Alert and Oriented  Insight: Developing/Improving and Engaged  Engagement in Therapy: Developing/Improving and Engaged  Modes of Intervention: Clarification, Confrontation, Discussion, Education, Exploration, Limit-setting, Orientation, Problem-solving, Rapport Building, Dance movement psychotherapist, Socialization and Support  Summary of Progress/Problems: The topic for group today was emotional regulation. This group focused on both positive and negative emotion identification and allowed group members to process ways to identify feelings, regulate negative emotions, and find healthy ways to manage internal/external emotions. Group members were asked to reflect on a time when their reaction to an emotion led to a negative outcome and explored how alternative responses using emotion regulation would have benefited them. Group members were also asked to discuss a time when emotion regulation was utilized when a negative emotion was experienced. Patient discussed experiencing high anxiety and isolation. CSW and other group members provided patient with emotional support and encouragement.   Samuella Bruin, MSW, [employer] Clinical Social Worker [facility] [phone]
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
## 2015-02-27 NOTE — Progress Notes (Signed)
[facility] Group Notes:  (Nursing/MHT/Case Management/Adjunct)  Date:  02/27/2015  Time:  2100 Type of Therapy:  wrapu up group  Participation Level:  Active  Participation Quality:  Appropriate, Attentive, Sharing and Supportive  Affect:  Depressed  Cognitive:  Appropriate  Insight:  Improving  Engagement in Group:  Engaged  Modes of Intervention:  Clarification, Education and Support  Summary of Progress/Problems:  Pt reports looking forward to being able to be discharged tomorrow. Pt shares that she had been in a terrible place because of her bi- polar illness and she hopes to be able to now "do what I need to do". Pt shares that her husband is loving and supportive and for leisure she likes to speed walk with friends.   Tanya Tucker 02/27/2015, 9:31 PM

## 2015-02-27 NOTE — [facility] Group Notes (Signed)
[facility] Group Notes:  (Nursing/MHT/Case Management/Adjunct)  Date:  02/27/2015  Time:  0900 am  Type of Therapy:  Psychoeducational Skills  Participation Level:  Active  Participation Quality:  Appropriate and Attentive  Affect:  Appropriate  Cognitive:  Alert and Appropriate  Insight:  Appropriate  Engagement in Group:  Improving  Modes of Intervention:  Support  Summary of Progress/Problems:  Tanya Tucker 02/27/2015, 10:39 AM

## 2015-02-27 NOTE — Progress Notes (Signed)
D-  Patient has been engaged in treatment this shift.  Patient has attended and actively participated in groups, patient presents as flat and tearful, but states that she is ready to discharge.  Patient denies SI, HI and AVH but is noticeably anxious.    A- assess patient for safety, offer medications as prescribed, engage patient in 1:1 staff talks,   R- patient able to contract for safety

## 2015-02-27 NOTE — Progress Notes (Signed)
Nutrition Education Note  Pt attended group focusing on general, healthful nutrition education.  RD emphasized the importance of eating regular meals and snacks throughout the day. Consuming sugar-free beverages and incorporating fruits and vegetables into diet when possible. Provided examples of healthy snacks. Patient encouraged to leave group with a goal to improve nutrition/healthy eating.   Diet Order: Diet regular Room service appropriate?: Yes; Fluid consistency:: Thin Pt is also offered choice of unit snacks mid-morning and mid-afternoon.  Pt is eating as desired.   If additional nutrition issues arise, please consult RD.     Odai Wimmer, RD, LDN Inpatient Clinical Dietitian Pager # [phone] After hours/weekend pager # [phone]     
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
## 2015-02-27 NOTE — Progress Notes (Signed)
D: Pt is alert and oriented x4. Pt with flat affect reluctantly denies depression and anxiety; states, "I took some Ativan earlier and I think that helped with both my anxiety and depression. Pt also denies pain, AVH, SI, and HI. Pt remained calm, cooperative, and nonviolent through the shift assessment.  A: Medications offered as prescribed.  Support, encouragement, and safe environment provided.  15-minute safety checks continue. R: Pt was med compliant.  Pt attended wrap-up group. Safety checks continued

## 2015-02-27 NOTE — [facility] Group Notes (Signed)
[facility] LCSW Group Therapy 02/27/2015 1:15pm  Type of Therapy: Group Therapy- Balance in Life  Participation Level: Active   Description of the Group:  The topic for group was balance in life. Today's group focused on defining balance in one's own words, identifying things that can knock one off balance, and exploring healthy ways to maintain balance in life. Group members were asked to provide an example of a time when they felt off balance, describe how they handled that situation,and process healthier ways to regain balance in the future. Group members were asked to share the most important tool for maintaining balance that they learned while at [facility] and how they plan to apply this method after discharge.  Summary of Patient Progress Pt participated actively in group discussion, identifying balance as being able to find enjoyment in life. Pt recognized that her life is often unbalanced when she is avoiding her problems. Pt expressed that she needs to go to a support group in order to maintain balance; further she would like to engage in more activities related to self-care.    Therapeutic Modalities:   Cognitive Behavioral Therapy Solution-Focused Therapy Assertiveness Training   Chad Cordial, LCSWA 02/27/2015 3:02 PM

## 2015-02-28 LAB — LITHIUM LEVEL: LITHIUM LVL: 1.07 mmol/L (ref 0.60–1.20)

## 2015-02-28 MED ORDER — LISINOPRIL-HYDROCHLOROTHIAZIDE 10-12.5 MG PO TABS: 1.0000 | ORAL_TABLET | Freq: Every day | ORAL | Status: DC

## 2015-02-28 MED ORDER — FERROUS SULFATE 325 (65 FE) MG PO TABS: 325.0000 mg | ORAL_TABLET | Freq: Every day | ORAL | Status: DC

## 2015-02-28 MED ORDER — QUETIAPINE FUMARATE 50 MG PO TABS: 150.0000 mg | ORAL_TABLET | Freq: Every day | ORAL | Status: DC

## 2015-02-28 MED ORDER — LITHIUM CARBONATE ER 300 MG PO TBCR: 600.0000 mg | EXTENDED_RELEASE_TABLET | Freq: Every day | ORAL | Status: DC

## 2015-02-28 MED ORDER — LITHIUM CARBONATE ER 300 MG PO TBCR: 300.0000 mg | EXTENDED_RELEASE_TABLET | Freq: Two times a day (BID) | ORAL | Status: DC
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
## 2015-02-28 NOTE — [facility] Group Notes (Signed)
   [facility] LCSW Aftercare Discharge Planning Group Note  02/28/2015  8:45 AM   Participation Quality: Alert, Appropriate and Oriented  Mood/Affect: Blunted but improving  Depression Rating: 5  Anxiety Rating: 3  Thoughts of Suicide: Pt denies SI/HI  Will you contract for safety? Yes  Current AVH: Pt denies  Plan for Discharge/Comments: Pt attended discharge planning group and actively participated in group. CSW provided pt with today's workbook. Patient plans to return home to follow up with outpatient services.   Transportation Means: Pt reports access to transportation  Supports: No supports mentioned at this time  Samuella Bruin, MSW, [employer] Clinical Social Worker [employer] [phone]
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
## 2015-02-28 NOTE — Progress Notes (Signed)
  [facility] Adult Case Management Discharge Plan :  Will you be returning to the same living situation after discharge:  Yes,  patient plans to return home  At discharge, do you have transportation home?: Yes,  patient's husband will transport Do you have the ability to pay for your medications: Yes,  patient will be provided with prescriptions at discharge.  Release of information consent forms completed and in the chart;  Patient's signature needed at discharge.  Patient to Follow up at: Follow-up Information    Follow up with [facility]. Go on 03/07/2015.   Why:  Medication management appointment with Melony Overly, PA on Friday February 3rd at 1pm. You will need to talk to Rosey Bath directly about switching to another provider in the office if you desire. Please call office if you need to reschedule appt.   Contact information:   [address] [place], [place] [address]  Phone: [phone]       Follow up with [facility] On 03/10/2015.   Why:  Therapy appt on Monday Feb. 6th at 3pm with Dr. Ulice Bold. Call office if you need to reschedule.       Next level of care provider has access to [facility] Link:no  Safety Planning and Suicide Prevention discussed: Yes,  with patient and husband  Have you used any form of tobacco in the last 30 days? (Cigarettes, Smokeless Tobacco, Cigars, and/or Pipes): No  Has patient been referred to the Quitline?: N/A patient is not a smoker  Patient has been referred for addiction treatment: N/A  Jayven Naill, [place] 02/28/2015, 11:28 AM
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
## 2015-02-28 NOTE — Progress Notes (Signed)
Patient ID: Tanya Tucker, female   DOB: 1960-02-19, 55 y.o.   MRN: [id number] Patient was discharged to home self care in the company of her husband.  Upon discharge patient was bright and smiling.  Patient acknowledged understanding of all discharge instructions.  All belongings were returned to patient.  Patient denies SI, HI and AVH.  Patient talked of future plans to spend time with her husband and daughter. Patient is very future oriented.

## 2015-02-28 NOTE — Progress Notes (Signed)
D: Pt is alert and oriented x4. Pt is flat, denies depression and anxiety, pain, AVH, SI, and HI. She states, "The doctor said I can go home tomorrow if my labs come out fine; I feel wonderful right now." Pt remained calm, cooperative, and nonviolent through the shift assessment.   A: Medications offered as prescribed.  Support, encouragement, and safe environment provided.  15-minute safety checks continue. R: Pt was med compliant.  Pt attended wrap-up group. Safety checks continued

## 2015-03-28 ENCOUNTER — Ambulatory Visit: Payer: Self-pay | Admitting: Internal Medicine

## 2015-03-31 ENCOUNTER — Other Ambulatory Visit: Payer: Self-pay | Admitting: Internal Medicine

## 2015-05-12 DIAGNOSIS — F314 Bipolar disorder, current episode depressed, severe, without psychotic features: Secondary | ICD-10-CM | POA: Diagnosis not present

## 2015-06-24 DIAGNOSIS — F314 Bipolar disorder, current episode depressed, severe, without psychotic features: Secondary | ICD-10-CM | POA: Diagnosis not present

## 2015-06-27 ENCOUNTER — Ambulatory Visit: Payer: Self-pay | Admitting: Internal Medicine

## 2015-07-02 DIAGNOSIS — F314 Bipolar disorder, current episode depressed, severe, without psychotic features: Secondary | ICD-10-CM | POA: Diagnosis not present

## 2015-07-03 DIAGNOSIS — F314 Bipolar disorder, current episode depressed, severe, without psychotic features: Secondary | ICD-10-CM | POA: Diagnosis not present

## 2015-07-15 ENCOUNTER — Ambulatory Visit (INDEPENDENT_AMBULATORY_CARE_PROVIDER_SITE_OTHER): Payer: BLUE CROSS/BLUE SHIELD | Admitting: Internal Medicine

## 2015-07-15 ENCOUNTER — Encounter: Payer: Self-pay | Admitting: Internal Medicine

## 2015-07-15 VITALS — BP 102/64 | HR 72 | Temp 97.7°F | Resp 16 | Ht 67.0 in | Wt 186.8 lb

## 2015-07-15 DIAGNOSIS — E559 Vitamin D deficiency, unspecified: Secondary | ICD-10-CM | POA: Diagnosis not present

## 2015-07-15 DIAGNOSIS — F3130 Bipolar disorder, current episode depressed, mild or moderate severity, unspecified: Secondary | ICD-10-CM

## 2015-07-15 DIAGNOSIS — I10 Essential (primary) hypertension: Secondary | ICD-10-CM

## 2015-07-15 DIAGNOSIS — Z79899 Other long term (current) drug therapy: Secondary | ICD-10-CM

## 2015-07-15 DIAGNOSIS — E782 Mixed hyperlipidemia: Secondary | ICD-10-CM

## 2015-07-15 DIAGNOSIS — E8881 Metabolic syndrome: Secondary | ICD-10-CM | POA: Diagnosis not present

## 2015-07-15 NOTE — Patient Instructions (Signed)
Recommend Adult Low Dose Aspirin or   coated  Aspirin 81 mg daily   To reduce risk of Colon Cancer 20 %,   Skin Cancer 26 % ,   Melanoma 46%   and   Pancreatic cancer 60%   ++++++++++++++++++++++++++++++++++++++++++++++++++++++ Vitamin D goal   is between 70-100.   Please make sure that you are taking your Vitamin D as directed.   It is very important as a natural anti-inflammatory   helping hair, skin, and nails, as well as reducing stroke and heart attack risk.   It helps your bones and helps with mood.  It also decreases numerous cancer risks so please take it as directed.   Low Vit D is associated with a 200-300% higher risk for CANCER   and 200-300% higher risk for HEART   ATTACK  &  STROKE.   ......................................  It is also associated with higher death rate at younger ages,   autoimmune diseases like Rheumatoid arthritis, Lupus, Multiple Sclerosis.     Also many other serious conditions, like depression, Alzheimer's  Dementia, infertility, muscle aches, fatigue, fibromyalgia - just to name a few.  ++++++++++++++++++++++++++++++++++++++++++++++++  Recommend the book "The END of DIETING" by Dr Joel Fuhrman   & the book "The END of DIABETES " by Dr Joel Fuhrman  At [employer] - get book & Audio CD's     Being diabetic has a  300% increased risk for heart attack, stroke, cancer, and alzheimer- type vascular dementia. It is very important that you work harder with diet by avoiding all foods that are white. Avoid white rice (brown & wild rice is OK), white potatoes (sweetpotatoes in moderation is OK), White bread or wheat bread or anything made out of white flour like bagels, donuts, rolls, buns, biscuits, cakes, pastries, cookies, pizza crust, and pasta (made from white flour & egg whites) - vegetarian pasta or spinach or wheat pasta is OK. Multigrain breads like Arnold's or Pepperidge Farm, or multigrain sandwich thins or flatbreads.  Diet,  exercise and weight loss can reverse and cure diabetes in the early stages.  Diet, exercise and weight loss is very important in the control and prevention of complications of diabetes which affects every system in your body, ie. Brain - dementia/stroke, eyes - glaucoma/blindness, heart - heart attack/heart failure, kidneys - dialysis, stomach - gastric paralysis, intestines - malabsorption, nerves - severe painful neuritis, circulation - gangrene & loss of a leg(s), and finally cancer and Alzheimers.    I recommend avoid fried & greasy foods,  sweets/candy, white rice (brown or wild rice or Quinoa is OK), white potatoes (sweet potatoes are OK) - anything made from white flour - bagels, doughnuts, rolls, buns, biscuits,white and wheat breads, pizza crust and traditional pasta made of white flour & egg white(vegetarian pasta or spinach or wheat pasta is OK).  Multi-grain bread is OK - like multi-grain flat bread or sandwich thins. Avoid alcohol in excess. Exercise is also important.    Eat all the vegetables you want - avoid meat, especially red meat and dairy - especially cheese.  Cheese is the most concentrated form of trans-fats which is the worst thing to clog up our arteries. Veggie cheese is OK which can be found in the fresh produce section at [employer] or [employer] or [employer]  ++++++++++++++++++++++++++++++++++++++++++++++++++ DASH Eating Plan  DASH stands for "Dietary Approaches to Stop Hypertension."   The DASH eating plan is a healthy eating plan that has been shown to reduce high blood   pressure (hypertension). Additional health benefits may include reducing the risk of type 2 diabetes mellitus, heart disease, and stroke. The DASH eating plan may also help with weight loss.  WHAT DO I NEED TO KNOW ABOUT THE DASH EATING PLAN?  For the DASH eating plan, you will follow these general guidelines:  Choose foods with a percent daily value for sodium of less than 5% (as listed on the food  label).  Use salt-free seasonings or herbs instead of table salt or sea salt.  Check with your health care provider or pharmacist before using salt substitutes.  Eat lower-sodium products, often labeled as "lower sodium" or "no salt added."  Eat fresh foods.  Eat more vegetables, fruits, and low-fat dairy products.    Choose whole grains. Look for the word "whole" as the first word in the ingredient list.  Choose fish   Limit sweets, desserts, sugars, and sugary drinks.  Choose heart-healthy fats.  Eat veggie cheese   Eat more home-cooked food and less restaurant, buffet, and fast food.  Limit fried foods.  Cook foods using methods other than frying.  Limit canned vegetables. If you do use them, rinse them well to decrease the sodium.  When eating at a restaurant, ask that your food be prepared with less salt, or no salt if possible.                      WHAT FOODS CAN I EAT?  Read Dr Joel Fuhrman's books on The End of Dieting & The End of Diabetes  Grains  Whole grain or whole wheat bread. Brown rice. Whole grain or whole wheat pasta. Quinoa, bulgur, and whole grain cereals. Low-sodium cereals. Corn or whole wheat flour tortillas. Whole grain cornbread. Whole grain crackers. Low-sodium crackers.  Vegetables  Fresh or frozen vegetables (raw, steamed, roasted, or grilled). Low-sodium or reduced-sodium tomato and vegetable juices. Low-sodium or reduced-sodium tomato sauce and paste. Low-sodium or reduced-sodium canned vegetables.   Fruits  All fresh, canned (in natural juice), or frozen fruits.  Protein Products   All fish and seafood.  Dried beans, peas, or lentils. Unsalted nuts and seeds. Unsalted canned beans.  Dairy  Low-fat dairy products, such as skim or 1% milk, 2% or reduced-fat cheeses, low-fat ricotta or cottage cheese, or plain low-fat yogurt. Low-sodium or reduced-sodium cheeses.  Fats and Oils  Tub margarines without trans fats. Light or  reduced-fat mayonnaise and salad dressings (reduced sodium). Avocado. Safflower, olive, or canola oils. Natural peanut or almond butter.  Other  Unsalted popcorn and pretzels. The items listed above may not be a complete list of recommended foods or beverages. Contact your dietitian for more options.  +++++++++++++++++++++++++++++++++++++++++++  WHAT FOODS ARE NOT RECOMMENDED?  Grains/ White flour or wheat flour  White bread. White pasta. White rice. Refined cornbread. Bagels and croissants. Crackers that contain trans fat.  Vegetables  Creamed or fried vegetables. Vegetables in a . Regular canned vegetables. Regular canned tomato sauce and paste. Regular tomato and vegetable juices.  Fruits  Dried fruits. Canned fruit in light or heavy syrup. Fruit juice.  Meat and Other Protein Products  Meat in general - RED mwaet & White meat.  Fatty cuts of meat. Ribs, chicken wings, bacon, sausage, bologna, salami, chitterlings, fatback, hot dogs, bratwurst, and packaged luncheon meats.  Dairy  Whole or 2% milk, cream, half-and-half, and cream cheese. Whole-fat or sweetened yogurt. Full-fat cheeses or blue cheese. Nondairy creamers and whipped toppings. Processed cheese, cheese spreads, or   cheese curds.  Condiments  Onion and garlic salt, seasoned salt, table salt, and sea salt. Canned and packaged gravies. Worcestershire sauce. Tartar sauce. Barbecue sauce. Teriyaki sauce. Soy sauce, including reduced sodium. Steak sauce. Fish sauce. Oyster sauce. Cocktail sauce. Horseradish. Ketchup and mustard. Meat flavorings and tenderizers. Bouillon cubes. Hot sauce. Tabasco sauce. Marinades. Taco seasonings. Relishes.  Fats and Oils Butter, stick margarine, lard, shortening and bacon fat. Coconut, palm kernel, or palm oils. Regular salad dressings.  Pickles and olives. Salted popcorn and pretzels.  The items listed above may not be a complete list of foods and beverages to  avoid.   +++++++++++++++++++++++++++++++++++++++++++++++++++ Lithium Toxicity Lithium toxicity, which is also called lithium poisoning, is the condition of having too much lithium in the blood. Lithium is a medicine that is used to treat bipolar disorder. Lithium toxicity happens when you take too much lithium. This condition also happens when you are taking lithium and a condition or another medicine affects the lithium levels in your body. Lithium toxicity can be life threatening. RISK FACTORS This condition is more likely to develop in people:  Of young age.  Of older age.  With kidney disease.  With heart disease.  With dehydration that is caused by sweating or diarrhea.  With low blood sodium levels. Certain medicines can increase the risk for this condition. They include:  Water loss pills (diuretics).  Certain medicines for high blood pressure.  Nonsteroidal anti-inflammatory drugs. SYMPTOMS The symptoms of mild to moderate lithium toxicity are:  Nausea and vomiting.  Diarrhea.  Drowsiness.  Thirst.  Muscle weakness.  Slurred speech.  Lack of coordination.  Frequent urination. The symptoms of moderate to severe lithium toxicity are:  Blurred vision.  Giddiness.  Ringing in the ears.  Severe muscle spasms.  Seizures.  Abnormal heart rhythm.  Loss of consciousness or coma. DIAGNOSIS To make a diagnosis, your health care provider will:  Look at your signs and symptoms.  Ask how much lithium you take, how long you have been taking it, and when you took your last dose.  Order blood tests to check your blood lithium level. TREATMENT If your lithium toxicity is mild or moderate, your dosage of lithium may be reduced or the medicine may be stopped. If your toxicity is severe, lithium may need to be removed from your body. The removal of lithium is done in a hospital emergency department. It may involve:  Gastric lavage. A tube is placed through  the nose or mouth into the stomach. The tube is used to remove lithium that has not been digested yet. It may also be used to put medicines directly into the stomach to help stop lithium from being absorbed.  Medicines that increase removal of lithium by the kidneys.  Use of an artificial kidney to clean the blood (dialysis). This is usually done only in the most severe cases. HOME CARE INSTRUCTIONS  Take medicines only as directed by your health care provider.  Drink enough fluid to keep your urine clear or pale yellow. PREVENTION Lithium toxicity is preventable. To help prevent it from recurring:  Take medicines only as directed by your health care provider.  Drink enough fluid to keep your urine clear or pale yellow.  Talk with your health care provider first if you want to start a low-salt (low-sodium) diet.  Have your blood lithium levels checked regularly.  Watch for signs and symptoms of lithium toxicity. If you have signs or symptoms, get treatment early. This can help to  prevent severe symptoms from developing. SEEK MEDICAL CARE IF:  You have signs or symptoms of mild or moderate lithium toxicity after you receive treatment, even if your blood lithium level is normal. SEEK IMMEDIATE MEDICAL CARE IF:  Your symptoms worsen.  You have signs or symptoms of severe lithium toxicity after you receive treatment.   This information is not intended to replace advice given to you by your health care provider. Make sure you discuss any questions you have with your health care provider.   Document Released: 11/15/2006 Document Revised: 02/08/2014 Document Reviewed: 09/23/2013 Elsevier Interactive Patient Education [employer]2016 [employer] Inc.

## 2015-07-16 LAB — CBC WITH DIFFERENTIAL/PLATELET
BASOS ABS: 63 {cells}/uL (ref 0–200)
Basophils Relative: 1 %
EOS ABS: 504 {cells}/uL — AB (ref 15–500)
EOS PCT: 8 %
HCT: 34.8 % — ABNORMAL LOW (ref 35.0–45.0)
Hemoglobin: 11.5 g/dL — ABNORMAL LOW (ref 11.7–15.5)
LYMPHS PCT: 29 %
Lymphs Abs: 1827 cells/uL (ref 850–3900)
MCH: 28.8 pg (ref 27.0–33.0)
MCHC: 33 g/dL (ref 32.0–36.0)
MCV: 87 fL (ref 80.0–100.0)
MONOS PCT: 7 %
MPV: 9.9 fL (ref 7.5–12.5)
Monocytes Absolute: 441 cells/uL (ref 200–950)
NEUTROS ABS: 3465 {cells}/uL (ref 1500–7800)
NEUTROS PCT: 55 %
PLATELETS: 206 10*3/uL (ref 140–400)
RBC: 4 MIL/uL (ref 3.80–5.10)
RDW: 13.9 % (ref 11.0–15.0)
WBC: 6.3 10*3/uL (ref 3.8–10.8)

## 2015-07-16 LAB — MAGNESIUM: MAGNESIUM: 1.8 mg/dL (ref 1.5–2.5)

## 2015-07-16 LAB — BASIC METABOLIC PANEL WITH GFR
BUN: 26 mg/dL — AB (ref 7–25)
CALCIUM: 10.5 mg/dL — AB (ref 8.6–10.4)
CHLORIDE: 102 mmol/L (ref 98–110)
CO2: 31 mmol/L (ref 20–31)
CREATININE: 0.98 mg/dL (ref 0.50–1.05)
GFR, Est African American: 77 mL/min (ref >=60)
GFR, Est Non African American: 67 mL/min (ref >=60)
Glucose, Bld: 96 mg/dL (ref 65–99)
Potassium: 3.9 mmol/L (ref 3.5–5.3)
Sodium: 137 mmol/L (ref 135–146)

## 2015-07-16 LAB — INSULIN, RANDOM: INSULIN: 12.3 u[IU]/mL (ref 2.0–19.6)

## 2015-07-16 LAB — LIPID PANEL
CHOLESTEROL: 207 mg/dL — AB (ref 125–200)
HDL: 33 mg/dL — ABNORMAL LOW (ref >=46)
Total CHOL/HDL Ratio: 6.3 Ratio — ABNORMAL HIGH (ref <=5.0)
Triglycerides: 405 mg/dL — ABNORMAL HIGH (ref <150)

## 2015-07-16 LAB — HEMOGLOBIN A1C
HEMOGLOBIN A1C: 5.4 % (ref <5.7)
Mean Plasma Glucose: 108 mg/dL

## 2015-07-16 LAB — HEPATIC FUNCTION PANEL
ALBUMIN: 4.4 g/dL (ref 3.6–5.1)
ALT: 15 U/L (ref 6–29)
AST: 18 U/L (ref 10–35)
Alkaline Phosphatase: 57 U/L (ref 33–130)
BILIRUBIN TOTAL: 0.4 mg/dL (ref 0.2–1.2)
Bilirubin, Direct: 0.1 mg/dL (ref <=0.2)
Indirect Bilirubin: 0.3 mg/dL (ref 0.2–1.2)
Total Protein: 6.6 g/dL (ref 6.1–8.1)

## 2015-07-16 LAB — TSH: TSH: 2.18 m[IU]/L

## 2015-07-16 LAB — VITAMIN D 25 HYDROXY (VIT D DEFICIENCY, FRACTURES): VIT D 25 HYDROXY: 55 ng/mL (ref 30–100)

## 2015-07-16 LAB — LITHIUM LEVEL: Lithium Lvl: 1.3 mmol/L — ABNORMAL HIGH (ref 0.6–1.2)
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
## 2015-08-15 ENCOUNTER — Other Ambulatory Visit: Payer: BLUE CROSS/BLUE SHIELD

## 2015-08-15 ENCOUNTER — Other Ambulatory Visit: Payer: Self-pay | Admitting: Internal Medicine

## 2015-08-15 DIAGNOSIS — D539 Nutritional anemia, unspecified: Secondary | ICD-10-CM

## 2015-08-15 LAB — CBC WITH DIFFERENTIAL/PLATELET
BASOS PCT: 1 %
Basophils Absolute: 43 cells/uL (ref 0–200)
EOS ABS: 344 {cells}/uL (ref 15–500)
EOS PCT: 8 %
HCT: 36.4 % (ref 35.0–45.0)
Hemoglobin: 11.8 g/dL (ref 11.7–15.5)
LYMPHS ABS: 1247 {cells}/uL (ref 850–3900)
Lymphocytes Relative: 29 %
MCH: 29 pg (ref 27.0–33.0)
MCHC: 32.4 g/dL (ref 32.0–36.0)
MCV: 89.4 fL (ref 80.0–100.0)
MONO ABS: 172 {cells}/uL — AB (ref 200–950)
MONOS PCT: 4 %
MPV: 10.2 fL (ref 7.5–12.5)
NEUTROS ABS: 2494 {cells}/uL (ref 1500–7800)
Neutrophils Relative %: 58 %
PLATELETS: 187 10*3/uL (ref 140–400)
RBC: 4.07 MIL/uL (ref 3.80–5.10)
RDW: 14.2 % (ref 11.0–15.0)
WBC: 4.3 10*3/uL (ref 3.8–10.8)

## 2015-08-15 LAB — IRON AND TIBC
%SAT: 21 % (ref 11–50)
IRON: 75 ug/dL (ref 45–160)
TIBC: 361 ug/dL (ref 250–450)
UIBC: 286 ug/dL (ref 125–400)

## 2015-08-15 LAB — VITAMIN B12: Vitamin B-12: 1465 pg/mL — ABNORMAL HIGH (ref 200–1100)

## 2015-08-15 LAB — RETICULOCYTES
ABS RETIC: 52910 {cells}/uL (ref 20000–80000)
RBC.: 4.07 MIL/uL (ref 3.80–5.10)
Retic Ct Pct: 1.3 %

## 2015-08-19 DIAGNOSIS — F314 Bipolar disorder, current episode depressed, severe, without psychotic features: Secondary | ICD-10-CM | POA: Diagnosis not present

## 2015-09-25 DIAGNOSIS — F314 Bipolar disorder, current episode depressed, severe, without psychotic features: Secondary | ICD-10-CM | POA: Diagnosis not present

## 2015-09-26 ENCOUNTER — Emergency Department (HOSPITAL_COMMUNITY)
Admission: EM | Admit: 2015-09-26 | Discharge: 2015-09-26 | Disposition: A | Discharge status: Home / Self Care | Payer: BLUE CROSS/BLUE SHIELD | Attending: Emergency Medicine | Admitting: Emergency Medicine

## 2015-09-26 ENCOUNTER — Telehealth: Payer: Self-pay | Admitting: *Deleted

## 2015-09-26 ENCOUNTER — Encounter (HOSPITAL_COMMUNITY): Payer: Self-pay | Admitting: Emergency Medicine

## 2015-09-26 ENCOUNTER — Emergency Department (HOSPITAL_COMMUNITY): Payer: BLUE CROSS/BLUE SHIELD

## 2015-09-26 DIAGNOSIS — F17210 Nicotine dependence, cigarettes, uncomplicated: Secondary | ICD-10-CM | POA: Diagnosis not present

## 2015-09-26 DIAGNOSIS — I10 Essential (primary) hypertension: Secondary | ICD-10-CM | POA: Insufficient documentation

## 2015-09-26 DIAGNOSIS — R29810 Facial weakness: Secondary | ICD-10-CM | POA: Diagnosis present

## 2015-09-26 DIAGNOSIS — R791 Abnormal coagulation profile: Secondary | ICD-10-CM | POA: Insufficient documentation

## 2015-09-26 DIAGNOSIS — R51 Headache: Secondary | ICD-10-CM | POA: Diagnosis not present

## 2015-09-26 DIAGNOSIS — Z79899 Other long term (current) drug therapy: Secondary | ICD-10-CM | POA: Insufficient documentation

## 2015-09-26 DIAGNOSIS — G510 Bell's palsy: Secondary | ICD-10-CM | POA: Diagnosis not present

## 2015-09-26 LAB — PROTIME-INR
INR: 1.05
PROTHROMBIN TIME: 13.8 s (ref 11.4–15.2)

## 2015-09-26 LAB — COMPREHENSIVE METABOLIC PANEL
ALBUMIN: 4.1 g/dL (ref 3.5–5.0)
ALT: 24 U/L (ref 14–54)
AST: 22 U/L (ref 15–41)
Alkaline Phosphatase: 56 U/L (ref 38–126)
Anion gap: 3 — ABNORMAL LOW (ref 5–15)
BUN: 12 mg/dL (ref 6–20)
CHLORIDE: 110 mmol/L (ref 101–111)
CO2: 28 mmol/L (ref 22–32)
CREATININE: 0.76 mg/dL (ref 0.44–1.00)
Calcium: 10.3 mg/dL (ref 8.9–10.3)
GFR calc Af Amer: >60 mL/min (ref >60)
Glucose, Bld: 95 mg/dL (ref 65–99)
POTASSIUM: 4.1 mmol/L (ref 3.5–5.1)
SODIUM: 141 mmol/L (ref 135–145)
Total Bilirubin: 0.7 mg/dL (ref 0.3–1.2)
Total Protein: 6.6 g/dL (ref 6.5–8.1)

## 2015-09-26 LAB — I-STAT TROPONIN, ED: TROPONIN I, POC: 0 ng/mL (ref 0.00–0.08)

## 2015-09-26 LAB — I-STAT CHEM 8, ED
BUN: 15 mg/dL (ref 6–20)
Calcium, Ion: 1.32 mmol/L — ABNORMAL HIGH (ref 1.13–1.30)
Chloride: 106 mmol/L (ref 101–111)
Creatinine, Ser: 0.8 mg/dL (ref 0.44–1.00)
Glucose, Bld: 91 mg/dL (ref 65–99)
HEMATOCRIT: 35 % — AB (ref 36.0–46.0)
HEMOGLOBIN: 11.9 g/dL — AB (ref 12.0–15.0)
POTASSIUM: 4.1 mmol/L (ref 3.5–5.1)
SODIUM: 139 mmol/L (ref 135–145)
TCO2: 27 mmol/L (ref 0–100)

## 2015-09-26 LAB — CBC
HEMATOCRIT: 37.8 % (ref 36.0–46.0)
Hemoglobin: 11.9 g/dL — ABNORMAL LOW (ref 12.0–15.0)
MCH: 28.8 pg (ref 26.0–34.0)
MCHC: 31.5 g/dL (ref 30.0–36.0)
MCV: 91.5 fL (ref 78.0–100.0)
PLATELETS: 190 10*3/uL (ref 150–400)
RBC: 4.13 MIL/uL (ref 3.87–5.11)
RDW: 13.5 % (ref 11.5–15.5)
WBC: 5.2 10*3/uL (ref 4.0–10.5)

## 2015-09-26 LAB — DIFFERENTIAL
BASOS PCT: 1 %
Basophils Absolute: 0.1 10*3/uL (ref 0.0–0.1)
EOS ABS: 0.4 10*3/uL (ref 0.0–0.7)
Eosinophils Relative: 9 %
Lymphocytes Relative: 35 %
Lymphs Abs: 1.8 10*3/uL (ref 0.7–4.0)
Monocytes Absolute: 0.3 10*3/uL (ref 0.1–1.0)
Monocytes Relative: 6 %
Neutro Abs: 2.6 10*3/uL (ref 1.7–7.7)
Neutrophils Relative %: 49 %

## 2015-09-26 LAB — CBG MONITORING, ED: Glucose-Capillary: 89 mg/dL (ref 65–99)

## 2015-09-26 LAB — APTT: aPTT: 28 seconds (ref 24–36)

## 2015-09-26 MED ORDER — PREDNISONE 20 MG PO TABS: 60.0000 mg | ORAL_TABLET | Freq: Every day | ORAL | 0 refills | Status: AC

## 2015-09-26 NOTE — ED Triage Notes (Addendum)
Pt from home with c/o left sided facial droop and dry watery eyes first noted this moring around 7 am.  Called her PCP who felt like it was Bell's Palsy and sent her to ED.  Denies any other symptoms, no hx of the same or strokes.  Equal grips bilaterally and no drift noted to extremities. NAD, A&O, ambulatory.

## 2015-09-26 NOTE — Telephone Encounter (Signed)
Patient called with complaints of her tongue "feeling burned" and her left eye and left side of mouth drooping.  Patient states she woke up this morning with these symptoms.  Per Dr. Kathryne SharperMcKeown's orders, patient was advised to go to ER for further E/T.  Patient expressed understanding.

## 2015-09-26 NOTE — ED Notes (Signed)
While hooking up patient to monitor Dr. Lynelle DoctorKnapp was in RM. Per Dr. Lynelle DoctorKnapp, cancelled the EKG and noted we only need 5 Lead Monitoring.
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
## 2015-09-30 DIAGNOSIS — F314 Bipolar disorder, current episode depressed, severe, without psychotic features: Secondary | ICD-10-CM | POA: Diagnosis not present

## 2015-10-20 ENCOUNTER — Ambulatory Visit (INDEPENDENT_AMBULATORY_CARE_PROVIDER_SITE_OTHER): Payer: BLUE CROSS/BLUE SHIELD | Admitting: Physician Assistant

## 2015-10-20 ENCOUNTER — Encounter: Payer: Self-pay | Admitting: Physician Assistant

## 2015-10-20 VITALS — BP 120/80 | HR 75 | Temp 97.5°F | Resp 16 | Ht 67.0 in | Wt 186.0 lb

## 2015-10-20 DIAGNOSIS — E8881 Metabolic syndrome: Secondary | ICD-10-CM

## 2015-10-20 DIAGNOSIS — E782 Mixed hyperlipidemia: Secondary | ICD-10-CM

## 2015-10-20 DIAGNOSIS — E559 Vitamin D deficiency, unspecified: Secondary | ICD-10-CM

## 2015-10-20 DIAGNOSIS — I10 Essential (primary) hypertension: Secondary | ICD-10-CM

## 2015-10-20 DIAGNOSIS — Z79899 Other long term (current) drug therapy: Secondary | ICD-10-CM | POA: Diagnosis not present

## 2015-10-20 DIAGNOSIS — F3130 Bipolar disorder, current episode depressed, mild or moderate severity, unspecified: Secondary | ICD-10-CM | POA: Diagnosis not present

## 2015-10-20 LAB — CBC WITH DIFFERENTIAL/PLATELET
BASOS ABS: 73 {cells}/uL (ref 0–200)
Basophils Relative: 1 %
EOS PCT: 6 %
Eosinophils Absolute: 438 cells/uL (ref 15–500)
HEMATOCRIT: 37.8 % (ref 35.0–45.0)
HEMOGLOBIN: 12.4 g/dL (ref 11.7–15.5)
LYMPHS PCT: 34 %
Lymphs Abs: 2482 cells/uL (ref 850–3900)
MCH: 28.6 pg (ref 27.0–33.0)
MCHC: 32.8 g/dL (ref 32.0–36.0)
MCV: 87.1 fL (ref 80.0–100.0)
MONOS PCT: 6 %
MPV: 9.7 fL (ref 7.5–12.5)
Monocytes Absolute: 438 cells/uL (ref 200–950)
NEUTROS ABS: 3869 {cells}/uL (ref 1500–7800)
NEUTROS PCT: 53 %
Platelets: 226 10*3/uL (ref 140–400)
RBC: 4.34 MIL/uL (ref 3.80–5.10)
RDW: 13.8 % (ref 11.0–15.0)
WBC: 7.3 10*3/uL (ref 3.8–10.8)

## 2015-10-20 NOTE — Progress Notes (Signed)
Patient ID: Tanya Tucker, female   DOB: Jul 14, 1960, 55 y.o.   MRN: [id number]  Assessment and Plan:  Hypertension:  -Continue medication,  -monitor blood pressure at home.  -Continue DASH diet.   -Reminder to go to the ER if any CP, SOB, nausea, dizziness, severe HA, changes vision/speech, left arm numbness and tingling, and jaw pain.  Cholesterol: -Continue diet and exercise.  -Check cholesterol.   Pre-diabetes: -Continue diet and exercise.  -Check A1C  Vitamin D Def: -check level -continue medications.   Bipolar -cont to see [facility]  Continue diet and meds as discussed. Further disposition pending results of labs.  HPI 55 y.o. female  presents for 3 month follow up with hypertension, hyperlipidemia, prediabetes and vitamin D.   Her blood pressure has been controlled at home, today their BP is  .   She does workout.  She reports that she is walking every day.  She reports that she is walking around 3 miles.   She denies chest pain, shortness of breath, dizziness. Had bell's 09/26/2015, treated in ED.  [place] go [place] next [place] with [place].  She has long history of bipolar DO, following with [facility] and reports she is doing well.   She is not on cholesterol medication and denies myalgias. Her cholesterol is not at goal. The cholesterol last visit was:   Lab Results  Component Value Date   CHOL 207 (H) 07/15/2015   HDL 33 (L) 07/15/2015   LDLCALC NOT CALC 07/15/2015   TRIG 405 (H) 07/15/2015   CHOLHDL 6.3 (H) 07/15/2015   . Last A1C in the office was:  Lab Results  Component Value Date   HGBA1C 5.4 07/15/2015   Patient is on Vitamin D supplement.  Lab Results  Component Value Date   VD25OH 55 07/15/2015      Current Medications:  Current Outpatient Prescriptions on File Prior to Visit  Medication Sig Dispense Refill  . Calcium Carbonate (CALCIUM 600 PO) Take 600 mg by mouth daily.    . Cholecalciferol (VITAMIN D PO) Take 10,000 Units by mouth  daily.    . ferrous sulfate 325 (65 FE) MG tablet Take 1 tablet (325 mg total) by mouth daily with breakfast.  3  . lisinopril-hydrochlorothiazide (PRINZIDE,ZESTORETIC) 10-12.5 MG tablet TAKE ONE TABLET BY MOUTH ONCE DAILY 90 tablet 1  . lithium carbonate (LITHOBID) 300 MG CR tablet Take 1 tablet (300 mg total) by mouth 2 (two) times daily. 60 tablet 0  . lithium carbonate (LITHOBID) 300 MG CR tablet Take 2 tablets (600 mg total) by mouth at bedtime. 60 tablet 0  . Progesterone Micronized (PROGESTERONE PO) Take by mouth. Takes for 7 days every 3 months.    . QUEtiapine (SEROQUEL) 50 MG tablet   0   No current facility-administered medications on file prior to visit.     Medical History:  Past Medical History:  Diagnosis Date  . Bipolar disorder (HCC)   . Hemorrhoids   . Hypertension   . Insulin resistance   . Vitamin D deficiency     Allergies:  Allergies  Allergen Reactions  . Prednisolone Other (See Comments)    Causes a reverse reaction     Review of Systems:  Review of Systems  Constitutional: Negative for chills, fever and malaise/fatigue.  HENT: Negative for congestion, ear pain and sore throat.   Eyes: Negative.   Respiratory: Negative for cough, shortness of breath and wheezing.   Cardiovascular: Negative for chest pain, palpitations and leg swelling.  Gastrointestinal: Negative for blood in stool, constipation, diarrhea, heartburn, melena, nausea and vomiting.  Genitourinary: Negative for dysuria, frequency and urgency.  Skin: Negative.   Neurological: Negative for dizziness, tingling, sensory change, loss of consciousness and headaches.    Family history- Review and unchanged  Social history- Review and unchanged  Physical Exam: LMP 04/02/2015 (Approximate)  Wt Readings from Last 3 Encounters:  07/15/15 186 lb 12.8 oz (84.7 kg)  01/16/15 172 lb (78 kg)  12/24/14 180 lb (81.6 kg)    General Appearance: Well nourished well developed, in no apparent  distress. Eyes: PERRLA, EOMs, conjunctiva no swelling or erythema ENT/Mouth: Ear canals normal without obstruction, swelling, erythma, discharge.  TMs normal bilaterally.  Oropharynx moist, clear, without exudate, or postoropharyngeal swelling. Neck: Supple, thyroid normal,no cervical adenopathy  Respiratory: Respiratory effort normal, Breath sounds clear A&P without rhonchi, wheeze, or rale.  No retractions, no accessory usage. Cardio: RRR with no MRGs. Brisk peripheral pulses without edema.  Abdomen: Soft, + BS,  Non tender, no guarding, rebound, hernias, masses. Musculoskeletal: Full ROM, 5/5 strength, Normal gait Skin: Warm, dry without rashes, lesions, ecchymosis.  Neuro: Awake and oriented X 3, Cranial nerves intact. Normal muscle tone, no cerebellar symptoms. Psych: Normal affect, Insight and Judgment appropriate.    Quentin Mulling, PA-C 1:37 PM [facility]

## 2015-10-21 LAB — LIPID PANEL
Cholesterol: 210 mg/dL — ABNORMAL HIGH (ref 125–200)
HDL: 36 mg/dL — ABNORMAL LOW (ref >=46)
LDL Cholesterol: 117 mg/dL (ref <130)
Total CHOL/HDL Ratio: 5.8 Ratio — ABNORMAL HIGH (ref <=5.0)
Triglycerides: 285 mg/dL — ABNORMAL HIGH (ref <150)
VLDL: 57 mg/dL — ABNORMAL HIGH (ref <30)

## 2015-10-21 LAB — BASIC METABOLIC PANEL WITH GFR
BUN: 22 mg/dL (ref 7–25)
CO2: 25 mmol/L (ref 20–31)
Calcium: 10.5 mg/dL — ABNORMAL HIGH (ref 8.6–10.4)
Chloride: 102 mmol/L (ref 98–110)
Creat: 1.09 mg/dL — ABNORMAL HIGH (ref 0.50–1.05)
GFR, Est African American: 67 mL/min (ref >=60)
GFR, Est Non African American: 59 mL/min — ABNORMAL LOW (ref >=60)
Glucose, Bld: 89 mg/dL (ref 65–99)
Potassium: 4 mmol/L (ref 3.5–5.3)
Sodium: 138 mmol/L (ref 135–146)

## 2015-10-21 LAB — MAGNESIUM: MAGNESIUM: 2.1 mg/dL (ref 1.5–2.5)

## 2015-10-21 LAB — TSH: TSH: 2.45 mIU/L

## 2015-10-21 LAB — VITAMIN D 25 HYDROXY (VIT D DEFICIENCY, FRACTURES): VIT D 25 HYDROXY: 54 ng/mL (ref 30–100)

## 2015-10-21 LAB — HEPATIC FUNCTION PANEL
ALT: 19 U/L (ref 6–29)
AST: 18 U/L (ref 10–35)
Albumin: 4.4 g/dL (ref 3.6–5.1)
Alkaline Phosphatase: 61 U/L (ref 33–130)
Bilirubin, Direct: 0.1 mg/dL (ref <=0.2)
Indirect Bilirubin: 0.5 mg/dL (ref 0.2–1.2)
Total Bilirubin: 0.6 mg/dL (ref 0.2–1.2)
Total Protein: 6.8 g/dL (ref 6.1–8.1)

## 2015-10-30 DIAGNOSIS — Z79899 Other long term (current) drug therapy: Secondary | ICD-10-CM | POA: Diagnosis not present

## 2015-11-11 DIAGNOSIS — F314 Bipolar disorder, current episode depressed, severe, without psychotic features: Secondary | ICD-10-CM | POA: Diagnosis not present

## 2015-11-25 ENCOUNTER — Other Ambulatory Visit: Payer: Self-pay | Admitting: Obstetrics & Gynecology

## 2015-11-25 DIAGNOSIS — Z1231 Encounter for screening mammogram for malignant neoplasm of breast: Secondary | ICD-10-CM

## 2015-12-02 ENCOUNTER — Ambulatory Visit
Admission: RE | Admit: 2015-12-02 | Discharge: 2015-12-02 | Disposition: A | Discharge status: Home / Self Care | Payer: BLUE CROSS/BLUE SHIELD | Source: Ambulatory Visit | Attending: Obstetrics & Gynecology | Admitting: Obstetrics & Gynecology

## 2015-12-02 DIAGNOSIS — Z1231 Encounter for screening mammogram for malignant neoplasm of breast: Secondary | ICD-10-CM

## 2015-12-23 DIAGNOSIS — F314 Bipolar disorder, current episode depressed, severe, without psychotic features: Secondary | ICD-10-CM | POA: Diagnosis not present

## 2015-12-29 ENCOUNTER — Encounter: Payer: Self-pay | Admitting: Internal Medicine

## 2016-01-14 ENCOUNTER — Other Ambulatory Visit: Payer: Self-pay | Admitting: Internal Medicine

## 2016-02-04 ENCOUNTER — Encounter: Payer: Self-pay | Admitting: Internal Medicine

## 2016-02-09 DIAGNOSIS — H25043 Posterior subcapsular polar age-related cataract, bilateral: Secondary | ICD-10-CM | POA: Diagnosis not present

## 2016-03-04 ENCOUNTER — Encounter: Payer: Self-pay | Admitting: Internal Medicine

## 2016-03-04 ENCOUNTER — Ambulatory Visit (INDEPENDENT_AMBULATORY_CARE_PROVIDER_SITE_OTHER): Payer: BLUE CROSS/BLUE SHIELD | Admitting: Internal Medicine

## 2016-03-04 VITALS — BP 112/74 | HR 64 | Temp 97.3°F | Resp 16 | Ht 67.0 in | Wt 186.8 lb

## 2016-03-04 DIAGNOSIS — Z23 Encounter for immunization: Secondary | ICD-10-CM | POA: Diagnosis not present

## 2016-03-04 DIAGNOSIS — Z136 Encounter for screening for cardiovascular disorders: Secondary | ICD-10-CM

## 2016-03-04 DIAGNOSIS — F3130 Bipolar disorder, current episode depressed, mild or moderate severity, unspecified: Secondary | ICD-10-CM

## 2016-03-04 DIAGNOSIS — Z1212 Encounter for screening for malignant neoplasm of rectum: Secondary | ICD-10-CM

## 2016-03-04 DIAGNOSIS — Z79899 Other long term (current) drug therapy: Secondary | ICD-10-CM | POA: Diagnosis not present

## 2016-03-04 DIAGNOSIS — I10 Essential (primary) hypertension: Secondary | ICD-10-CM

## 2016-03-04 DIAGNOSIS — Z0000 Encounter for general adult medical examination without abnormal findings: Secondary | ICD-10-CM | POA: Diagnosis not present

## 2016-03-04 DIAGNOSIS — E782 Mixed hyperlipidemia: Secondary | ICD-10-CM

## 2016-03-04 DIAGNOSIS — E8881 Metabolic syndrome: Secondary | ICD-10-CM

## 2016-03-04 DIAGNOSIS — Z0001 Encounter for general adult medical examination with abnormal findings: Secondary | ICD-10-CM

## 2016-03-04 DIAGNOSIS — R5383 Other fatigue: Secondary | ICD-10-CM

## 2016-03-04 DIAGNOSIS — E559 Vitamin D deficiency, unspecified: Secondary | ICD-10-CM

## 2016-03-04 DIAGNOSIS — Z111 Encounter for screening for respiratory tuberculosis: Secondary | ICD-10-CM | POA: Diagnosis not present

## 2016-03-04 LAB — URINALYSIS, ROUTINE W REFLEX MICROSCOPIC
Bilirubin Urine: NEGATIVE
Glucose, UA: NEGATIVE
HGB URINE DIPSTICK: NEGATIVE
KETONES UR: NEGATIVE
Leukocytes, UA: NEGATIVE
NITRITE: NEGATIVE
Protein, ur: NEGATIVE
Specific Gravity, Urine: 1.01 (ref 1.001–1.035)
pH: 7 (ref 5.0–8.0)

## 2016-03-04 LAB — VITAMIN B12: Vitamin B-12: >2000 pg/mL — ABNORMAL HIGH (ref 200–1100)

## 2016-03-04 LAB — CBC WITH DIFFERENTIAL/PLATELET
Basophils Absolute: 59 cells/uL (ref 0–200)
Basophils Relative: 1 %
Eosinophils Absolute: 354 cells/uL (ref 15–500)
Eosinophils Relative: 6 %
HEMATOCRIT: 38.5 % (ref 35.0–45.0)
HEMOGLOBIN: 12.6 g/dL (ref 11.7–15.5)
LYMPHS ABS: 1711 {cells}/uL (ref 850–3900)
Lymphocytes Relative: 29 %
MCH: 28.5 pg (ref 27.0–33.0)
MCHC: 32.7 g/dL (ref 32.0–36.0)
MCV: 87.1 fL (ref 80.0–100.0)
MONO ABS: 354 {cells}/uL (ref 200–950)
MPV: 9.9 fL (ref 7.5–12.5)
Monocytes Relative: 6 %
NEUTROS ABS: 3422 {cells}/uL (ref 1500–7800)
NEUTROS PCT: 58 %
Platelets: 225 10*3/uL (ref 140–400)
RBC: 4.42 MIL/uL (ref 3.80–5.10)
RDW: 13.8 % (ref 11.0–15.0)
WBC: 5.9 10*3/uL (ref 3.8–10.8)

## 2016-03-04 LAB — HEMOGLOBIN A1C
Hgb A1c MFr Bld: 4.9 % (ref <5.7)
Mean Plasma Glucose: 94 mg/dL

## 2016-03-04 LAB — TSH: TSH: 3.24 mIU/L

## 2016-03-04 NOTE — Patient Instructions (Signed)

## 2016-03-04 NOTE — Progress Notes (Signed)
[facility] Lucky Cowboy, M.D.    Dyanne Carrel. Steffanie Dunn, P.A.-C      Terri Piedra, P.A.-C  [facility]                [address]                [place], [place]. [address] Telephone [phone] Telefax [phone]  Annual Screening/Preventative Visit  & Comprehensive Evaluation &  Examination     This very nice 56 y.o. MWF presents for a Screening/Preventative Visit & comprehensive evaluation and management of multiple medical co-morbidities.  Patient has been followed for HTN, Prediabetes, Hyperlipidemia and Vitamin D Deficiency.Parient also has a bipolar disorder and is followed at [facility] by Lowell Guitar, PA-C.       HTN predates since 2008. Patient's BP has been controlled at home and patient denies any cardiac symptoms as chest pain, palpitations, shortness of breath, dizziness or ankle swelling. Today's BP is at goal - 112/74.      Patient's hyperlipidemia is not controlled with diet. Last lipids were not at goal: Lab Results  Component Value Date   CHOL 210 (H) 10/20/2015   HDL 36 (L) 10/20/2015   LDLCALC 117 10/20/2015   TRIG 285 (H) 10/20/2015   CHOLHDL 5.8 (H) 10/20/2015      Patient has prediabetes/Insulin Resistance predating since 2014 with A1c 5.4% and elevated Insulin 42. Patient denies reactive hypoglycemic symptoms, visual blurring, diabetic polys, or paresthesias. Last A1c was at goal: Lab Results  Component Value Date   HGBA1C 5.4 07/15/2015      Finally, patient has history of Vitamin D Deficiency in 2010 of "19" and last Vitamin D was at goal: Lab Results  Component Value Date   VD25OH 69 10/20/2015   Current Outpatient Prescriptions on File Prior to Visit  Medication Sig  . BABY ASPIRIN PO Take 2 tablets by mouth.   . Calcium Carbonate (CALCIUM 600 PO) Take 600 mg by mouth daily.  . Cholecalciferol (VITAMIN D PO) Take 10,000 Units by mouth daily.  . ferrous  sulfate 325 (65 FE) MG tablet Take 1 tablet (325 mg total) by mouth daily with breakfast.  . lisinopril-hydrochlorothiazide (PRINZIDE,ZESTORETIC) 10-12.5 MG tablet TAKE ONE TABLET BY MOUTH ONCE DAILY  . lithium carbonate (LITHOBID) 300 MG CR tablet Take 2 tablets (600 mg total) by mouth at bedtime.  . Progesterone Micronized (PROGESTERONE PO) Take by mouth. Takes for 7 days every 3 months.  . QUEtiapine (SEROQUEL) 50 MG tablet    No current facility-administered medications on file prior to visit.    Allergies  Allergen Reactions  . Prednisolone Other (See Comments)    Causes a reverse reaction   Past Medical History:  Diagnosis Date  . Bipolar disorder (HCC)   . Hemorrhoids   . Hypertension   . Insulin resistance   . Vitamin D deficiency    Health Maintenance  Topic Date Due  . Hepatitis C Screening  November 25, 1960  . HIV Screening  12/18/1977  . PAP SMEAR  12/19/1983  . COLONOSCOPY  12/18/2012  . INFLUENZA VACCINE  09/02/2015  . TETANUS/TDAP  07/18/2017  . MAMMOGRAM  12/01/2017   Immunization History  Administered Date(s) Administered  . Influenza Split 10/24/2012, 12/10/2013  . Influenza, Seasonal, Injecte, Preservative Fre 12/24/2014  . Influenza,inj,quad, With Preservative 03/04/2016  . PPD Test 12/10/2013, 12/24/2014, 03/04/2016  . Pneumococcal Polysaccharide-23 10/24/2012  . Tdap 07/19/2007   Past Surgical History:  Procedure  Laterality Date  . TONSILLECTOMY AND ADENOIDECTOMY     Family History  Problem Relation Age of Onset  . Cataracts Mother   . Osteoporosis Mother   . Heart disease Father   . Hypertension Father   . Diabetes Father    Social History  Substance Use Topics  . Smoking status: Current Some Day Smoker    Types: Cigarettes  . Smokeless tobacco: Never Used     Comment: uses e-cigs  . Alcohol use 1.8 oz/week    3 Standard drinks or equivalent per week     Comment: "a few times a week"    ROS Constitutional: Denies fever, chills, weight  loss/gain, headaches, insomnia,  night sweats, and change in appetite. Does c/o fatigue. Eyes: Denies redness, blurred vision, diplopia, discharge, itchy, watery eyes.  ENT: Denies discharge, congestion, post nasal drip, epistaxis, sore throat, earache, hearing loss, dental pain, Tinnitus, Vertigo, Sinus pain, snoring.  Cardio: Denies chest pain, palpitations, irregular heartbeat, syncope, dyspnea, diaphoresis, orthopnea, PND, claudication, edema Respiratory: denies cough, dyspnea, DOE, pleurisy, hoarseness, laryngitis, wheezing.  Gastrointestinal: Denies dysphagia, heartburn, reflux, water brash, pain, cramps, nausea, vomiting, bloating, diarrhea, constipation, hematemesis, melena, hematochezia, jaundice, hemorrhoids Genitourinary: Denies dysuria, frequency, urgency, nocturia, hesitancy, discharge, hematuria, flank pain Breast: Breast lumps, nipple discharge, bleeding.  Musculoskeletal: Denies arthralgia, myalgia, stiffness, Jt. Swelling, pain, limp, and strain/sprain. Denies falls. Skin: Denies puritis, rash, hives, warts, acne, eczema, changing in skin lesion Neuro: No weakness, tremor, incoordination, spasms, paresthesia, pain Psychiatric: Denies confusion, memory loss, sensory loss. Denies Depression. Endocrine: Denies change in weight, skin, hair change, nocturia, and paresthesia, diabetic polys, visual blurring, hyper / hypo glycemic episodes.  Heme/Lymph: No excessive bleeding, bruising, enlarged lymph nodes.  Physical Exam  BP 112/74   Pulse 64   Temp 97.3 F (36.3 C)   Resp 16   Ht $Rem [id number]$5\' 7"26 kg/m   General Appearance: Well nourished and in no apparent distress.  Eyes: PERRLA, EOMs, conjunctiva no swelling or erythema, normal fundi and vessels. Sinuses: No frontal/maxillary tenderness ENT/Mouth: EACs patent / TMs  nl. Nares clear without erythema, swelling, mucoid exudates. Oral hygiene is good. No erythema, swelling, or exudate.  Tongue normal, non-obstructing. Tonsils not swollen or erythematous. Hearing normal.  Neck: Supple, thyroid normal. No bruits, nodes or JVD. Respiratory: Respiratory effort normal.  BS equal and clear bilateral without rales, rhonci, wheezing or stridor. Cardio: Heart sounds are normal with regular rate and rhythm and no murmurs, rubs or gallops. Peripheral pulses are normal and equal bilaterally without edema. No aortic or femoral bruits. Chest: symmetric with normal excursions and percussion. Breasts: Symmetric, without lumps, nipple discharge, retractions, or fibrocystic changes.  Abdomen: Flat, soft with bowel sounds active. Nontender, no guarding, rebound, hernias, masses, or organomegaly.  Lymphatics: Non tender without lymphadenopathy.  Genitourinary:  Musculoskeletal: Full ROM all peripheral extremities, joint stability, 5/5 strength, and normal gait. Skin: Warm and dry without rashes, lesions, cyanosis, clubbing or  ecchymosis.  Neuro: Cranial nerves intact, reflexes equal bilaterally. Normal muscle tone, no cerebellar symptoms. Sensation intact.  Pysch: Alert and oriented X 3, normal affect, Insight and Judgment appropriate.   Assessment and Plan  1. Annual Preventative Screening Examination   2. Essential hypertension  - Microalbumin / creatinine urine ratio - EKG 12-Lead - US, RETROPERITNL ABD,  LTD - Urinalysis, Routine w reflex microscopic - CBC with Differential/Platelet - BASIC METABOLIC PANEL WITH GFR - TSH  3.  Mixed hyperlipidemia  - EKG 12-Lead - [place], RETROPERITNL ABD,  LTD - Hepatic function panel - Lipid panel - TSH  4. Insulin resistance  - EKG 12-Lead - [place], RETROPERITNL ABD,  LTD - Hemoglobin A1c - Insulin, random  5. Vitamin D deficiency  - VITAMIN D 25 Hydroxy   6. Bipolar affective disorder (HCC)  - Lithium level  7. Screening for rectal cancer  - POC Hemoccult Bld/Stl   8. Screening for ischemic heart disease  - EKG 12-Lead  9.  Screening for AAA (aortic abdominal aneurysm)  - [place], RETROPERITNL ABD,  LTD  10. Screening examination for pulmonary tuberculosis  - PPD  11. Need for prophylactic vaccination and inoculation against influenza  - Flu Vaccine QUAD with presevative  12. Other fatigue  - Vitamin B12 - Iron and TIBC - CBC with Differential/Platelet - TSH  13. Medication management  - Urinalysis, Routine w reflex microscopic - CBC with Differential/Platelet - BASIC METABOLIC PANEL WITH GFR - Hepatic function panel - Magnesium - Lithium level      Continue prudent diet as discussed, weight control, BP monitoring, regular exercise, and medications. Discussed med's effects and SE's. Screening labs and tests as requested with regular follow-up as recommended. Over 40 minutes of exam, counseling, chart review and high complex critical decision making was performed.

## 2016-03-05 ENCOUNTER — Telehealth: Payer: Self-pay | Admitting: Internal Medicine

## 2016-03-05 LAB — MICROALBUMIN / CREATININE URINE RATIO
CREATININE, URINE: 46 mg/dL (ref 20–320)
Microalb Creat Ratio: 4 mcg/mg creat (ref <30)
Microalb, Ur: 0.2 mg/dL

## 2016-03-05 LAB — LIPID PANEL
CHOL/HDL RATIO: 5.5 ratio — AB (ref <5.0)
CHOLESTEROL: 224 mg/dL — AB (ref <200)
HDL: 41 mg/dL — AB (ref >50)
LDL Cholesterol: 135 mg/dL — ABNORMAL HIGH (ref <100)
Triglycerides: 242 mg/dL — ABNORMAL HIGH (ref <150)
VLDL: 48 mg/dL — ABNORMAL HIGH (ref <30)

## 2016-03-05 LAB — HEPATIC FUNCTION PANEL
ALK PHOS: 64 U/L (ref 33–130)
ALT: 20 U/L (ref 6–29)
AST: 18 U/L (ref 10–35)
Albumin: 4.6 g/dL (ref 3.6–5.1)
BILIRUBIN INDIRECT: 0.6 mg/dL (ref 0.2–1.2)
Bilirubin, Direct: 0.1 mg/dL (ref <=0.2)
TOTAL PROTEIN: 7.1 g/dL (ref 6.1–8.1)
Total Bilirubin: 0.7 mg/dL (ref 0.2–1.2)

## 2016-03-05 LAB — IRON AND TIBC
%SAT: 19 % (ref 11–50)
IRON: 70 ug/dL (ref 45–160)
TIBC: 376 ug/dL (ref 250–450)
UIBC: 306 ug/dL (ref 125–400)

## 2016-03-05 LAB — BASIC METABOLIC PANEL WITH GFR
BUN: 19 mg/dL (ref 7–25)
CALCIUM: 10.5 mg/dL — AB (ref 8.6–10.4)
CHLORIDE: 103 mmol/L (ref 98–110)
CO2: 26 mmol/L (ref 20–31)
Creat: 1.07 mg/dL — ABNORMAL HIGH (ref 0.50–1.05)
GFR, EST NON AFRICAN AMERICAN: 59 mL/min — AB (ref >=60)
GFR, Est African American: 68 mL/min (ref >=60)
GLUCOSE: 84 mg/dL (ref 65–99)
POTASSIUM: 4.2 mmol/L (ref 3.5–5.3)
Sodium: 136 mmol/L (ref 135–146)

## 2016-03-05 LAB — VITAMIN D 25 HYDROXY (VIT D DEFICIENCY, FRACTURES): VIT D 25 HYDROXY: 70 ng/mL (ref 30–100)

## 2016-03-05 LAB — MAGNESIUM: Magnesium: 2.1 mg/dL (ref 1.5–2.5)

## 2016-03-05 LAB — LITHIUM LEVEL: LITHIUM LVL: 1.2 mmol/L (ref 0.6–1.2)

## 2016-03-05 LAB — INSULIN, RANDOM: Insulin: 14.9 u[IU]/mL (ref 2.0–19.6)

## 2016-03-05 NOTE — Telephone Encounter (Signed)
Per the patients request I faxed the most recent Lithium level be faxed to Melony Overlyeresa Hurst, PA at [facility], fax- [phone].

## 2016-03-08 LAB — TB SKIN TEST
Induration: 0 mm
TB Skin Test: NEGATIVE

## 2016-03-11 DIAGNOSIS — F314 Bipolar disorder, current episode depressed, severe, without psychotic features: Secondary | ICD-10-CM | POA: Diagnosis not present

## 2016-03-24 ENCOUNTER — Ambulatory Visit: Payer: BLUE CROSS/BLUE SHIELD | Admitting: *Deleted

## 2016-03-24 ENCOUNTER — Encounter: Payer: Self-pay | Admitting: *Deleted

## 2016-03-24 DIAGNOSIS — Z79899 Other long term (current) drug therapy: Secondary | ICD-10-CM

## 2016-03-24 DIAGNOSIS — F3130 Bipolar disorder, current episode depressed, mild or moderate severity, unspecified: Secondary | ICD-10-CM | POA: Diagnosis not present

## 2016-03-24 NOTE — Progress Notes (Signed)
Patient here for labs per Melony Overlyeresa Hurst for Lithium level,.

## 2016-03-25 LAB — LITHIUM LEVEL: LITHIUM LVL: 0.8 mmol/L (ref 0.6–1.2)

## 2016-04-07 ENCOUNTER — Ambulatory Visit (INDEPENDENT_AMBULATORY_CARE_PROVIDER_SITE_OTHER): Payer: BLUE CROSS/BLUE SHIELD | Admitting: Internal Medicine

## 2016-04-07 ENCOUNTER — Encounter: Payer: Self-pay | Admitting: Internal Medicine

## 2016-04-07 VITALS — BP 100/62 | HR 74 | Temp 98.0°F | Resp 16 | Ht 67.0 in | Wt 186.0 lb

## 2016-04-07 DIAGNOSIS — H6591 Unspecified nonsuppurative otitis media, right ear: Secondary | ICD-10-CM

## 2016-04-07 MED ORDER — PROMETHAZINE-DM 6.25-15 MG/5ML PO SYRP
ORAL_SOLUTION | ORAL | 1 refills | Status: DC
Start: 2016-04-07 — End: 2016-08-02

## 2016-04-07 MED ORDER — AZELASTINE-FLUTICASONE 137-50 MCG/ACT NA SUSP: NASAL | 0 refills | Status: DC

## 2016-04-07 NOTE — Progress Notes (Signed)
HPI  Patient presents to the office for evaluation of right ear pain.  It has been going on for 1 days.  Patient reports night > day, dry, barky, worse with lying down.  They also endorse change in voice and nasal congestion, rhinorrhea, ear hurts to the touch, no ear drainage, no ear popping and clicking.  Mild sore throat.  .  They have tried mucinex.  They report that nothing has worked.  They admits to other sick contacts.  Review of Systems  Constitutional: Positive for malaise/fatigue. Negative for chills and fever.  HENT: Positive for congestion, ear pain, hearing loss and sore throat.   Respiratory: Positive for cough. Negative for sputum production, shortness of breath and wheezing.   Cardiovascular: Negative for chest pain, palpitations and leg swelling.  Neurological: Positive for headaches.    PE:  Vitals:   04/07/16 1601  BP: 100/62  Pulse: 74  Resp: 16  Temp: 98 F (36.7 C)    General:  Alert and non-toxic, WDWN, NAD HEENT: NCAT, PERLA, EOM normal, no occular discharge or erythema.  Nasal mucosal edema with sinus tenderness to palpation.  Oropharynx clear with minimal oropharyngeal edema and erythema.  Mucous membranes moist and pink. Neck:  Cervical adenopathy Chest:  RRR no MRGs.  Lungs clear to auscultation A&P with no wheezes rhonchi or rales.   Abdomen: +BS x 4 quadrants, soft, non-tender, no guarding, rigidity, or rebound. Skin: warm and dry no rash Neuro: A&Ox4, CN II-XII grossly intact  Assessment and Plan:   1. Fluid level behind tympanic membrane of right ear -dymista -phenergan dm -start daily antihistamine -cont mucinex as desired

## 2016-04-08 ENCOUNTER — Other Ambulatory Visit: Payer: Self-pay | Admitting: *Deleted

## 2016-04-08 MED ORDER — AZELASTINE HCL 0.1 % NA SOLN: 2.0000 | Freq: Two times a day (BID) | NASAL | 2 refills | Status: DC

## 2016-04-08 MED ORDER — FLUTICASONE PROPIONATE 50 MCG/ACT NA SUSP: 2.0000 | Freq: Every day | NASAL | 2 refills | Status: DC

## 2016-04-19 DIAGNOSIS — F314 Bipolar disorder, current episode depressed, severe, without psychotic features: Secondary | ICD-10-CM | POA: Diagnosis not present

## 2016-04-21 DIAGNOSIS — Z6828 Body mass index (BMI) 28.0-28.9, adult: Secondary | ICD-10-CM | POA: Diagnosis not present

## 2016-04-21 DIAGNOSIS — Z1151 Encounter for screening for human papillomavirus (HPV): Secondary | ICD-10-CM | POA: Diagnosis not present

## 2016-04-21 DIAGNOSIS — Z01419 Encounter for gynecological examination (general) (routine) without abnormal findings: Secondary | ICD-10-CM | POA: Diagnosis not present

## 2016-04-25 ENCOUNTER — Other Ambulatory Visit: Payer: Self-pay | Admitting: Internal Medicine

## 2016-08-02 ENCOUNTER — Ambulatory Visit (INDEPENDENT_AMBULATORY_CARE_PROVIDER_SITE_OTHER): Payer: BLUE CROSS/BLUE SHIELD | Admitting: Internal Medicine

## 2016-08-02 ENCOUNTER — Encounter: Payer: Self-pay | Admitting: Internal Medicine

## 2016-08-02 VITALS — BP 102/62 | HR 64 | Temp 97.3°F | Resp 16 | Ht 67.0 in | Wt 183.6 lb

## 2016-08-02 DIAGNOSIS — M940 Chondrocostal junction syndrome [Tietze]: Secondary | ICD-10-CM | POA: Diagnosis not present

## 2016-08-02 DIAGNOSIS — F314 Bipolar disorder, current episode depressed, severe, without psychotic features: Secondary | ICD-10-CM | POA: Diagnosis not present

## 2016-08-02 MED ORDER — DEXAMETHASONE 0.5 MG PO TABS: ORAL_TABLET | ORAL | 0 refills | Status: DC

## 2016-08-02 NOTE — Progress Notes (Signed)
  Subjective:    Patient ID: Tanya Tucker, female    DOB: 1960-04-09, 56 y.o.   MRN: [id number]  HPI  Patient present with Lt upper para sternal discomfort w/o hx/o discrete injury. Denies cough, congestion, sputum, chills, fever or rash. Denies GI sx's.   Medication Sig  . azelastine (ASTELIN) 0.1 % nasal spray Place 2 sprays into both nostrils 2 (two) times daily. Use in each nostril as directed  . Azelastine-Fluticasone 137-50 MCG/ACT SUSP Use two sprays per nostril each day.  Look down at your toes and do not snort medication up  . BABY ASPIRIN PO Take 2 tablets by mouth.   . Calcium Carbonate (CALCIUM 600 PO) Take 600 mg by mouth daily.  . Cholecalciferol (VITAMIN D PO) Take 10,000 Units by mouth daily.  . ferrous sulfate 325 (65 FE) MG tablet Take 1 tablet (325 mg total) by mouth daily with breakfast.  . fluticasone (FLONASE) 50 MCG/ACT nasal spray Place 2 sprays into both nostrils daily.  Marland Kitchen. lisinopril-hydrochlorothiazide (PRINZIDE,ZESTORETIC) 10-12.5 MG tablet TAKE ONE TABLET BY MOUTH ONCE DAILY  . lithium carbonate (LITHOBID) 300 MG CR tablet Take 2 tablets (600 mg total) by mouth at bedtime.  Marland Kitchen. QUEtiapine (SEROQUEL) 50 MG tablet   . Progesterone Micronized (PROGESTERONE PO) Take by mouth. Takes for 7 days every 3 months.  . promethazine-dextromethorphan (PROMETHAZINE-DM) 6.25-15 MG/5ML syrup Take 5-10 ML PO q8hrs prn for cough   Past Medical History:  Diagnosis Date  . Bipolar disorder (HCC)   . Hemorrhoids   . Hypertension   . Insulin resistance   . Vitamin D deficiency    Past Surgical History:  Procedure Laterality Date  . TONSILLECTOMY AND ADENOIDECTOMY     Review of Systems  10 point systems review negative except as above.    Objective:   Physical Exam  BP 102/62   Pulse 64   Temp 97.3 F (36.3 C)   Resp 16   Ht 5\' 7"  (1.702 m)   Wt 183 lb 9.6 oz (83.3 kg)   BMI 28.76 kg/m   In no Distress or respirator stridor.   HEENT - WNL. Neck - supple.  Chest  - Clear equal BS. (+) tender Lt 2sd-4th CC jts.  Cor - Nl HS. RRR w/o sig MGR. PP 1(+). No edema. MS- FROM w/o deformities.  Gait Nl. Neuro -  Nl w/o focal abnormalities. Skin - clear w/o rash, cyanosis, icterus.    Assessment & Plan:   1. Costochondritis  - dexamethasone (DECADRON) 0.5 MG tablet; Take 1 tab 3 x day - 3 days, then 2 x day - 3 days, then 1 tab daily  Dispense: 20 tablet; Refill: 0  - recc heating pad.   - reassured

## 2016-08-02 NOTE — Patient Instructions (Signed)
Costochondritis Costochondritis is swelling and irritation (inflammation) of the tissue (cartilage) that connects your ribs to your breastbone (sternum). This causes pain in the front of your chest. The pain usually starts gradually and involves more than one rib. What are the causes? The exact cause of this condition is not always known. It results from stress on the cartilage where your ribs attach to your sternum. The cause of this stress could be:  Chest injury (trauma).  Exercise or activity, such as lifting.  Severe coughing.  What increases the risk? You may be at higher risk for this condition if you:  Are female.  Are 30?54 years old.  Recently started a new exercise or work activity.  Have low levels of vitamin D.  Have a condition that makes you cough frequently.  What are the signs or symptoms? The main symptom of this condition is chest pain. The pain:  Usually starts gradually and can be sharp or dull.  Gets worse with deep breathing, coughing, or exercise.  Gets better with rest.  May be worse when you press on the sternum-rib connection (tenderness).  How is this diagnosed? This condition is diagnosed based on your symptoms, medical history, and a physical exam. Your health care provider will check for tenderness when pressing on your sternum. This is the most important finding. You may also have tests to rule out other causes of chest pain. These may include:  A chest X-ray to check for lung problems.  An electrocardiogram (ECG) to see if you have a heart problem that could be causing the pain.  An imaging scan to rule out a chest or rib fracture.  How is this treated? This condition usually goes away on its own over time. Your health care provider may prescribe an NSAID to reduce pain and inflammation. Your health care provider may also suggest that you:  Rest and avoid activities that make pain worse.  Apply heat or cold to the area to reduce pain  and inflammation.  Do exercises to stretch your chest muscles.  If these treatments do not help, your health care provider may inject a numbing medicine at the sternum-rib connection to help relieve the pain. Follow these instructions at home:  Avoid activities that make pain worse. This includes any activities that use chest, abdominal, and side muscles.  If directed, put ice on the painful area: ? Put ice in a plastic bag. ? Place a towel between your skin and the bag. ? Leave the ice on for 20 minutes, 2-3 times a day.  If directed, apply heat to the affected area as often as told by your health care provider. Use the heat source that your health care provider recommends, such as a moist heat pack or a heating pad. ? Place a towel between your skin and the heat source. ? Leave the heat on for 20-30 minutes. ? Remove the heat if your skin turns bright red. This is especially important if you are unable to feel pain, heat, or cold. You may have a greater risk of getting burned.  Take over-the-counter and prescription medicines only as told by your health care provider.  Return to your normal activities as told by your health care provider. Ask your health care provider what activities are safe for you.  Keep all follow-up visits as told by your health care provider. This is important. Contact a health care provider if:  You have chills or a fever.  Your pain does not go   away or it gets worse.  You have a cough that does not go away (is persistent). Get help right away if:  You have shortness of breath. This information is not intended to replace advice given to you by your health care provider. Make sure you discuss any questions you have with your health care provider. Document Released: 10/28/2004 Document Revised: 08/08/2015 Document Reviewed: 05/14/2015 Elsevier Interactive Patient Education  2018 [employer]   

## 2016-09-02 ENCOUNTER — Ambulatory Visit: Payer: Self-pay | Admitting: Physician Assistant

## 2016-09-08 DIAGNOSIS — F314 Bipolar disorder, current episode depressed, severe, without psychotic features: Secondary | ICD-10-CM | POA: Diagnosis not present

## 2016-09-27 NOTE — Progress Notes (Signed)
Patient ID: Alinah Sheard, female   DOB: 02-04-1960, 56 y.o.   MRN: [id number]  Assessment and Plan:  Hypertension:  -Continue medication,  -monitor blood pressure at home.  -Continue DASH diet.   -Reminder to go to the ER if any CP, SOB, nausea, dizziness, severe HA, changes vision/speech, left arm numbness and tingling, and jaw pain.  Cholesterol: -Continue diet and exercise.  -Check cholesterol.   Vitamin D Def: -check level -continue medications.   Bipolar -cont to see [facility]  Continue diet and meds as discussed. Further disposition pending results of labs. Future Appointments Date Time Provider Department Center  03/07/2017 10:00 AM Lucky Cowboy, MD [facility] None   HPI 56 y.o. female  presents for 3 month follow up with hypertension, hyperlipidemia, prediabetes and vitamin D.   Her blood pressure has been controlled at home, today their BP is BP: 112/76.   She does workout.  She reports that she is walking every day.   She denies chest pain, shortness of breath, dizziness. She has long history of bipolar DO, following with [facility] and reports she is doing well. She has to lift 25 lbs boxes at work, and has discomfort in her chest, intermittent dull central chest pain, worse with lifting boxes at work.    She is not on cholesterol medication and denies myalgias. Her cholesterol is not at goal. The cholesterol last visit was:   Lab Results  Component Value Date   CHOL 224 (H) 03/04/2016   HDL 41 (L) 03/04/2016   LDLCALC 135 (H) 03/04/2016   TRIG 242 (H) 03/04/2016   CHOLHDL 5.5 (H) 03/04/2016   . Last A1C in the office was:  Lab Results  Component Value Date   HGBA1C 4.9 03/04/2016   Patient is on Vitamin D supplement.  Lab Results  Component Value Date   VD25OH 70 03/04/2016     BMI is Body mass index is 28.88 kg/m., she is working on diet and exercise. Wt Readings from Last 3 Encounters:  09/28/16 184 lb 6.4 oz (83.6 kg)  08/02/16 183 lb 9.6 oz  (83.3 kg)  04/07/16 186 lb (84.4 kg)     Current Medications:  Current Outpatient Prescriptions on File Prior to Visit  Medication Sig Dispense Refill  . azelastine (ASTELIN) 0.1 % nasal spray Place 2 sprays into both nostrils 2 (two) times daily. Use in each nostril as directed 30 mL 2  . BABY ASPIRIN PO Take 2 tablets by mouth.     . Calcium Carbonate (CALCIUM 600 PO) Take 600 mg by mouth daily.    . Cholecalciferol (VITAMIN D PO) Take 10,000 Units by mouth daily.    Marland Kitchen dexamethasone (DECADRON) 0.5 MG tablet Take 1 tab 3 x day - 3 days, then 2 x day - 3 days, then 1 tab daily 20 tablet 0  . ferrous sulfate 325 (65 FE) MG tablet Take 1 tablet (325 mg total) by mouth daily with breakfast.  3  . lisinopril-hydrochlorothiazide (PRINZIDE,ZESTORETIC) 10-12.5 MG tablet TAKE ONE TABLET BY MOUTH ONCE DAILY 90 tablet 1  . lithium carbonate (LITHOBID) 300 MG CR tablet Take 2 tablets (600 mg total) by mouth at bedtime. 60 tablet 0  . progesterone (PROMETRIUM) 200 MG capsule   1  . QUEtiapine (SEROQUEL) 50 MG tablet   0   No current facility-administered medications on file prior to visit.     Medical History:  Past Medical History:  Diagnosis Date  . Bipolar disorder (HCC)   . Hemorrhoids   .  Hypertension   . Insulin resistance   . Vitamin D deficiency     Allergies:  Allergies  Allergen Reactions  . Prednisolone Other (See Comments)    Causes a reverse reaction     Review of Systems:  Review of Systems  Constitutional: Negative for chills, fever and malaise/fatigue.  HENT: Negative for congestion, ear pain and sore throat.   Eyes: Negative.   Respiratory: Negative for cough, shortness of breath and wheezing.   Cardiovascular: Negative for chest pain, palpitations and leg swelling.  Gastrointestinal: Negative for blood in stool, constipation, diarrhea, heartburn, melena, nausea and vomiting.  Genitourinary: Negative for dysuria, frequency and urgency.  Skin: Negative.    Neurological: Negative for dizziness, tingling, sensory change, loss of consciousness and headaches.    Family history- Review and unchanged  Social history- Review and unchanged  Physical Exam: BP 112/76   Pulse 68   Temp (!) 97.5 F (36.4 C)   Resp 16   Ht 5\' 7"  (1.702 m)   Wt 184 lb 6.4 oz (83.6 kg)   SpO2 98%   BMI 28.88 kg/m  Wt Readings from Last 3 Encounters:  09/28/16 184 lb 6.4 oz (83.6 kg)  08/02/16 183 lb 9.6 oz (83.3 kg)  04/07/16 186 lb (84.4 kg)    General Appearance: Well nourished well developed, in no apparent distress. Eyes: PERRLA, EOMs, conjunctiva no swelling or erythema ENT/Mouth: Ear canals normal without obstruction, swelling, erythma, discharge.  TMs normal bilaterally.  Oropharynx moist, clear, without exudate, or postoropharyngeal swelling. Neck: Supple, thyroid normal,no cervical adenopathy  Respiratory: Respiratory effort normal, Breath sounds clear A&P without rhonchi, wheeze, or rale.  No retractions, no accessory usage. Cardio: RRR with no MRGs. Brisk peripheral pulses without edema.  Abdomen: Soft, + BS,  Non tender, no guarding, rebound, hernias, masses. Musculoskeletal: Full ROM, 5/5 strength, Normal gait Skin: Warm, dry without rashes, lesions, ecchymosis.  Neuro: Awake and oriented X 3, Cranial nerves intact. Normal muscle tone, no cerebellar symptoms. Psych: Normal affect, Insight and Judgment appropriate.    Quentin Mulling, PA-C 4:04 PM [facility]

## 2016-09-28 ENCOUNTER — Ambulatory Visit (INDEPENDENT_AMBULATORY_CARE_PROVIDER_SITE_OTHER): Payer: BLUE CROSS/BLUE SHIELD | Admitting: Physician Assistant

## 2016-09-28 ENCOUNTER — Encounter: Payer: Self-pay | Admitting: Physician Assistant

## 2016-09-28 VITALS — BP 112/76 | HR 68 | Temp 97.5°F | Resp 16 | Ht 67.0 in | Wt 184.4 lb

## 2016-09-28 DIAGNOSIS — E782 Mixed hyperlipidemia: Secondary | ICD-10-CM

## 2016-09-28 DIAGNOSIS — E8881 Metabolic syndrome: Secondary | ICD-10-CM

## 2016-09-28 DIAGNOSIS — Z79899 Other long term (current) drug therapy: Secondary | ICD-10-CM

## 2016-09-28 DIAGNOSIS — F3163 Bipolar disorder, current episode mixed, severe, without psychotic features: Secondary | ICD-10-CM | POA: Diagnosis not present

## 2016-09-28 DIAGNOSIS — I10 Essential (primary) hypertension: Secondary | ICD-10-CM

## 2016-09-28 MED ORDER — LISINOPRIL-HYDROCHLOROTHIAZIDE 10-12.5 MG PO TABS: 1.0000 | ORAL_TABLET | Freq: Every day | ORAL | 1 refills | Status: DC

## 2016-09-28 NOTE — Patient Instructions (Addendum)
  If you are traveling you can take these medications to be more prepared. If you get chest pain, shortness of breath or abdominal pain please go to the hospital wherever you may be.   Ciprofloxacin is good for travelers diarrhea, you can take 2 pills a day for 7 days. Or it is also good for urinary tract infections, you can take 2 a day for 7 days.  Zpak- is good for sinus infections please finish as prescribed Phenergran is for nausea but it sedating so plan on eating and sleeping.  Levsin is good for nausea, diarrhea, or abdominal cramping- it can constipate you so don't take too much. This dissolves under your tongue.  Diflucan is for a yeast infection.  Can send in meclizine for dizziness   Look for voltern gel when you travel  You can take tylenol (500mg ) or tylenol arthritis (650mg ) with the meloxicam/antiinflammatories. The max you can take of tylenol a day is 3000mg  daily, this is a max of 6 pills a day of the regular tyelnol (500mg ) or a max of 4 a day of the tylenol arthritis (650mg ) as long as no other medications you are taking contain tylenol.

## 2016-09-29 NOTE — Progress Notes (Signed)
Pt aware of lab results & voiced understanding of those results.

## 2016-09-30 LAB — BASIC METABOLIC PANEL WITH GFR
BUN: 22 mg/dL (ref 7–25)
CHLORIDE: 104 mmol/L (ref 98–110)
CO2: 30 mmol/L (ref 20–32)
CREATININE: 0.83 mg/dL (ref 0.50–1.05)
Calcium: 10.2 mg/dL (ref 8.6–10.4)
GFR, Est African American: 93 mL/min/{1.73_m2} (ref >=60)
GFR, Est Non African American: 81 mL/min/{1.73_m2} (ref >=60)
GLUCOSE: 90 mg/dL (ref 65–99)
Potassium: 4.2 mmol/L (ref 3.5–5.3)
SODIUM: 138 mmol/L (ref 135–146)

## 2016-09-30 LAB — HEPATIC FUNCTION PANEL
AG RATIO: 2 (calc) (ref 1.0–2.5)
ALKALINE PHOSPHATASE (APISO): 53 U/L (ref 33–130)
ALT: 16 U/L (ref 6–29)
AST: 17 U/L (ref 10–35)
Albumin: 4.3 g/dL (ref 3.6–5.1)
Bilirubin, Direct: 0.1 mg/dL (ref 0.0–0.2)
Globulin: 2.2 g/dL (calc) (ref 1.9–3.7)
Indirect Bilirubin: 0.3 mg/dL (calc) (ref 0.2–1.2)
Total Bilirubin: 0.4 mg/dL (ref 0.2–1.2)
Total Protein: 6.5 g/dL (ref 6.1–8.1)

## 2016-09-30 LAB — CBC WITH DIFFERENTIAL/PLATELET
BASOS PCT: 1.4 %
Basophils Absolute: 91 cells/uL (ref 0–200)
EOS PCT: 5.5 %
Eosinophils Absolute: 358 cells/uL (ref 15–500)
HCT: 35 % (ref 35.0–45.0)
HEMOGLOBIN: 11.9 g/dL (ref 11.7–15.5)
Lymphs Abs: 2067 cells/uL (ref 850–3900)
MCH: 29.2 pg (ref 27.0–33.0)
MCHC: 34 g/dL (ref 32.0–36.0)
MCV: 85.8 fL (ref 80.0–100.0)
MONOS PCT: 7.2 %
MPV: 10.6 fL (ref 7.5–12.5)
NEUTROS ABS: 3517 {cells}/uL (ref 1500–7800)
Neutrophils Relative %: 54.1 %
Platelets: 210 10*3/uL (ref 140–400)
RBC: 4.08 10*6/uL (ref 3.80–5.10)
RDW: 12.9 % (ref 11.0–15.0)
Total Lymphocyte: 31.8 %
WBC mixed population: 468 cells/uL (ref 200–950)
WBC: 6.5 10*3/uL (ref 3.8–10.8)

## 2016-09-30 LAB — LIPID PANEL
CHOL/HDL RATIO: 5.4 (calc) — AB (ref <5.0)
CHOLESTEROL: 221 mg/dL — AB (ref <200)
HDL: 41 mg/dL — AB (ref >50)
LDL Cholesterol (Calc): 140 mg/dL (calc) — ABNORMAL HIGH
Non-HDL Cholesterol (Calc): 180 mg/dL (calc) — ABNORMAL HIGH (ref <130)
Triglycerides: 260 mg/dL — ABNORMAL HIGH (ref <150)

## 2016-09-30 LAB — TSH: TSH: 2.31 m[IU]/L

## 2016-09-30 LAB — MAGNESIUM: Magnesium: 2.2 mg/dL (ref 1.5–2.5)

## 2016-10-06 ENCOUNTER — Encounter: Payer: Self-pay | Admitting: Adult Health

## 2016-10-06 ENCOUNTER — Ambulatory Visit (INDEPENDENT_AMBULATORY_CARE_PROVIDER_SITE_OTHER): Payer: BLUE CROSS/BLUE SHIELD | Admitting: Adult Health

## 2016-10-06 VITALS — BP 118/70 | HR 63 | Temp 97.3°F | Resp 16 | Ht 67.0 in | Wt 179.4 lb

## 2016-10-06 DIAGNOSIS — H65191 Other acute nonsuppurative otitis media, right ear: Secondary | ICD-10-CM | POA: Diagnosis not present

## 2016-10-06 DIAGNOSIS — J310 Chronic rhinitis: Secondary | ICD-10-CM

## 2016-10-06 DIAGNOSIS — Z79899 Other long term (current) drug therapy: Secondary | ICD-10-CM | POA: Diagnosis not present

## 2016-10-06 MED ORDER — FLUTICASONE PROPIONATE 50 MCG/ACT NA SUSP: 1.0000 | Freq: Every day | NASAL | 0 refills | Status: DC

## 2016-10-06 NOTE — Patient Instructions (Addendum)
if worsening HA, changes vision/speech, imbalance, weakness go to the ER  Your ears and sinuses are connected by the eustachian tube. When your sinuses are inflamed, this can close off the tube and cause fluid to collect in your middle ear. This can then cause dizziness, popping, clicking, ringing, and echoing in your ears. This is often NOT an infection and does NOT require antibiotics, it is caused by inflammation so the treatments help the inflammation. This can take a long time to get better so please be patient.  Here are things you can do to help with this: - Try the Flonase or Nasonex. Remember to spray each nostril twice towards the outer part of your eye.  Do not sniff but instead pinch your nose and tilt your head back to help the medicine get into your sinuses.  The best time to do this is at bedtime.Stop if you get blurred vision or nose bleeds.  -While drinking fluids, pinch and hold nose close and swallow, to help open eustachian tubes to drain fluid behind ear drums. -Please pick one of the over the counter allergy medications below and take it once daily for allergies.  It will also help with fluid behind ear drums. Claritin or loratadine cheapest but likely the weakest  Zyrtec or certizine at night because it can make you sleepy The strongest is allegra or fexafinadine  Cheapest at [employer], [employer], [employer] -can use decongestant over the counter, please do not use if you have high blood pressure or certain heart conditions.     HOW TO TREAT VIRAL COUGH AND COLD SYMPTOMS:  -Symptoms usually last at least 1 week with the worst symptoms being around day 4.  - colds usually start with a sore throat and end with a cough, and the cough can take 2 weeks to get better.  -No antibiotics are needed for colds, flu, sore throats, cough, bronchitis UNLESS symptoms are longer than 7 days OR if you are getting better then get drastically worse.  -There are a lot of combination medications  (Dayquil, Nyquil, Vicks 44, tyelnol cold and sinus, ETC). Please look at the ingredients on the back so that you are treating the correct symptoms and not doubling up on medications/ingredients.    Medicines you can use  Nasal congestion  - pseudoephedrine (Sudafed)- behind the counter, do not use if you have high blood pressure, medicine that have -D in them.  - phenylephrine (Sudafed PE) -Dextormethorphan + chlorpheniramine (Coridcidin HBP)- okay if you have high blood pressure -Oxymetazoline (Afrin) nasal spray- LIMIT to 3 days -Saline nasal spray -Neti pot (used distilled or bottled water)  Ear pain/congestion  -pseudoephedrine (sudafed) - Nasonex/flonase nasal spray  Fever  -Acetaminophen (Tyelnol) -Ibuprofen (Advil, motrin, aleve)  Sore Throat  -Acetaminophen (Tyelnol) -Ibuprofen (Advil, motrin, aleve) -Drink a lot of water -Gargle with salt water - Rest your voice (don't talk) -Throat sprays -Cough drops  Body Aches  -Acetaminophen (Tyelnol) -Ibuprofen (Advil, motrin, aleve)  Headache  -Acetaminophen (Tyelnol) -Ibuprofen (Advil, motrin, aleve) - Exedrin, Exedrin Migraine  Allergy symptoms (cough, sneeze, runny nose, itchy eyes) -Claritin or loratadine cheapest but likely the weakest  -Zyrtec or certizine at night because it can make you sleepy -The strongest is allegra or fexafinadine  Cheapest at [employer], [employer], [employer]  Cough  -Dextromethorphan (Delsym)- medicine that has DM in it -Guafenesin (Mucinex/Robitussin) - cough drops - drink lots of water  Chest Congestion  -Guafenesin (Mucinex/Robitussin)  Red Itchy Eyes  - Naphcon-A  Upset Stomach  -  Bland diet (nothing spicy, greasy, fried, and high acid foods like tomatoes, oranges, berries) -OKAY- cereal, bread, soup, crackers, rice -Eat smaller more frequent meals -reduce caffeine, no alcohol -Loperamide (Imodium-AD) if diarrhea -Prevacid for heart burn  General health when  sick  -Hydration -wash your hands frequently -keep surfaces clean -change pillow cases and sheets often -Get fresh air but do not exercise strenuously -Vitamin D, double up on it - Vitamin C -Zinc
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
## 2016-10-23 ENCOUNTER — Other Ambulatory Visit: Payer: Self-pay | Admitting: Internal Medicine

## 2016-10-25 ENCOUNTER — Other Ambulatory Visit: Payer: Self-pay | Admitting: Obstetrics & Gynecology

## 2016-10-25 DIAGNOSIS — Z1231 Encounter for screening mammogram for malignant neoplasm of breast: Secondary | ICD-10-CM

## 2016-11-26 DIAGNOSIS — F314 Bipolar disorder, current episode depressed, severe, without psychotic features: Secondary | ICD-10-CM | POA: Diagnosis not present

## 2016-12-02 ENCOUNTER — Ambulatory Visit
Admission: RE | Admit: 2016-12-02 | Discharge: 2016-12-02 | Disposition: A | Discharge status: Home / Self Care | Payer: BLUE CROSS/BLUE SHIELD | Source: Ambulatory Visit | Attending: Obstetrics & Gynecology | Admitting: Obstetrics & Gynecology

## 2016-12-02 DIAGNOSIS — Z1231 Encounter for screening mammogram for malignant neoplasm of breast: Secondary | ICD-10-CM | POA: Diagnosis not present

## 2017-01-19 DIAGNOSIS — F314 Bipolar disorder, current episode depressed, severe, without psychotic features: Secondary | ICD-10-CM | POA: Diagnosis not present

## 2017-01-29 IMAGING — CT CT HEAD W/O CM
3 of 4 series · 17 of 47 positions shown, 20 images · non-contrast
Comparison: 02/21/2015

CLINICAL DATA: Pt having left sided facial paralysis, slight left
sided headache and some trouble getting words out. Pt states this
all started this AM.

EXAM:
CT HEAD WITHOUT CONTRAST
TECHNIQUE: Contiguous axial images were obtained from the base of the skull
through the vertex without intravenous contrast.

[Series 201: head w/o, idose (1) · axial · non-contrast · 0.49mm/px · z∈[+91,+221]mm · 11 of 32 slices shown, 14 images]
[im 3/32  brain]
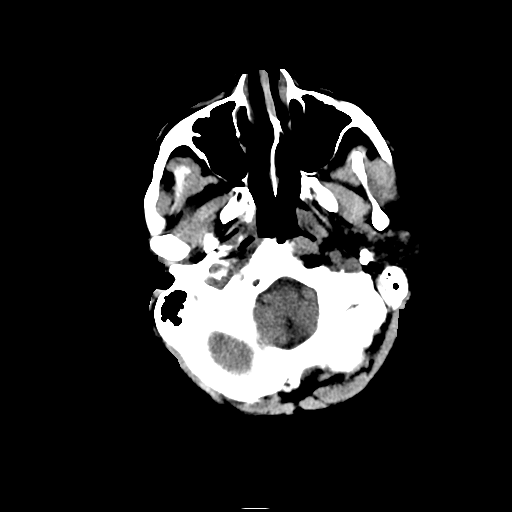
[im 3/32  bone]
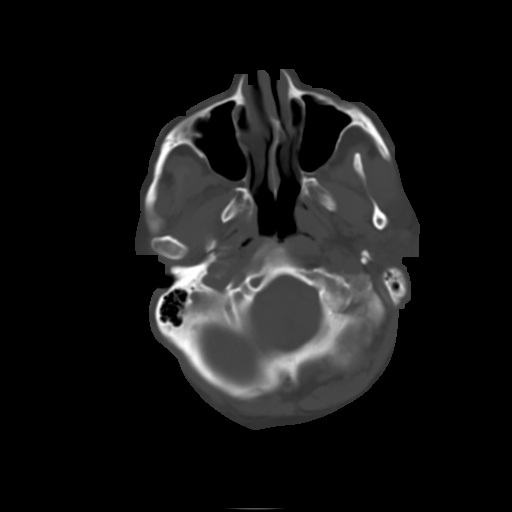
[im 5/32  brain]
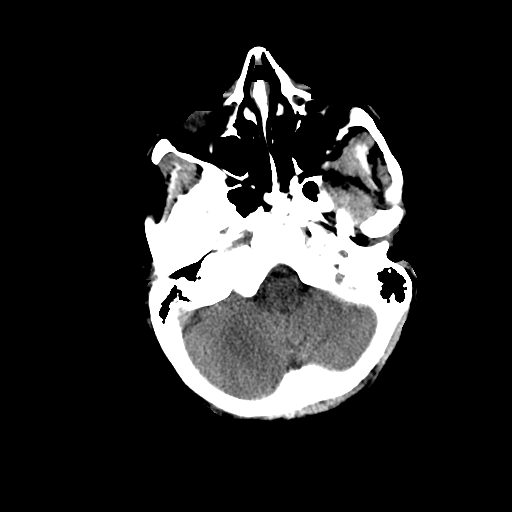
[im 7/32  brain]
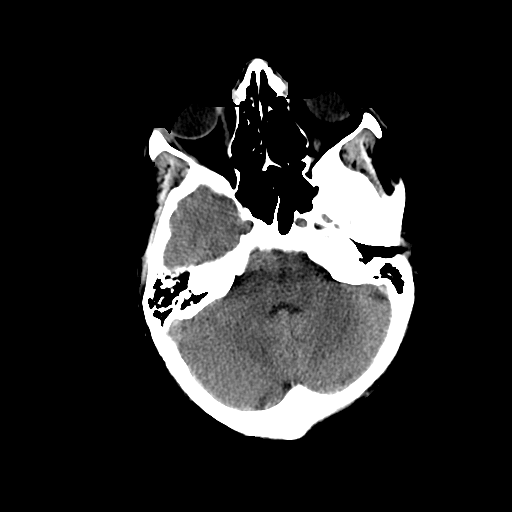
[im 12/32  brain]
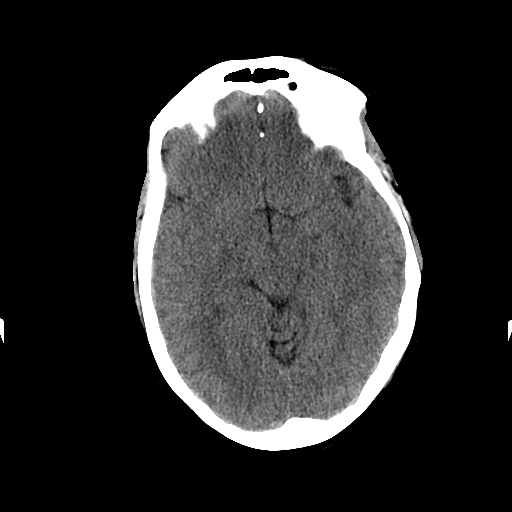
[im 14/32  brain]
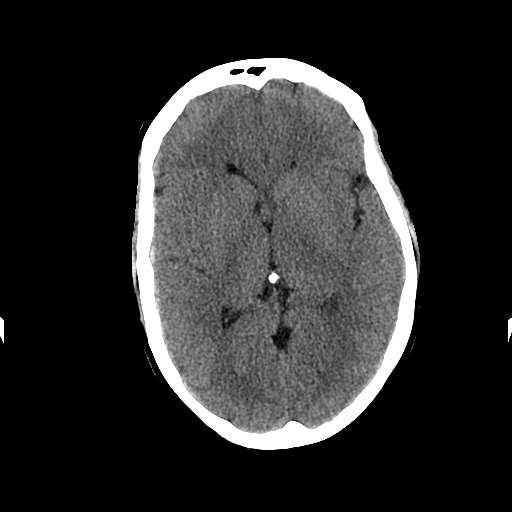
[im 14/32  bone]
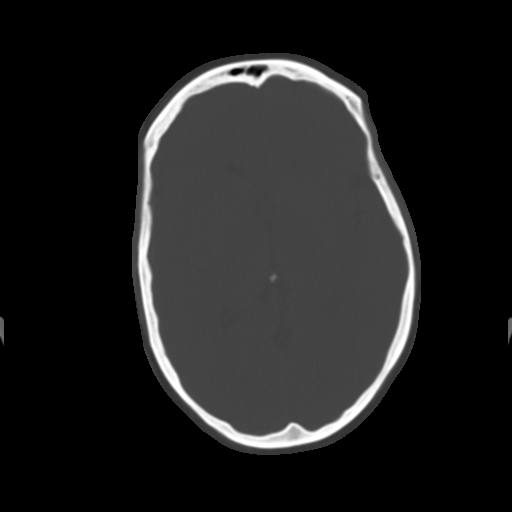
[im 16/32  brain]
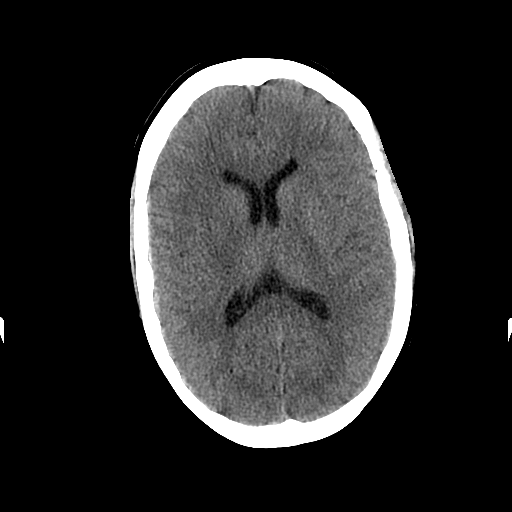
[im 18/32  brain]
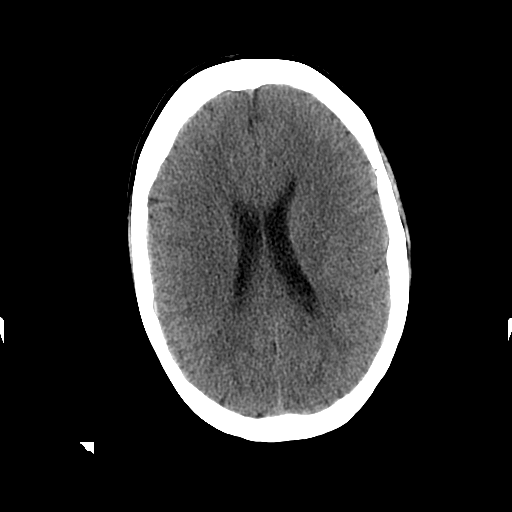
[im 20/32  brain]
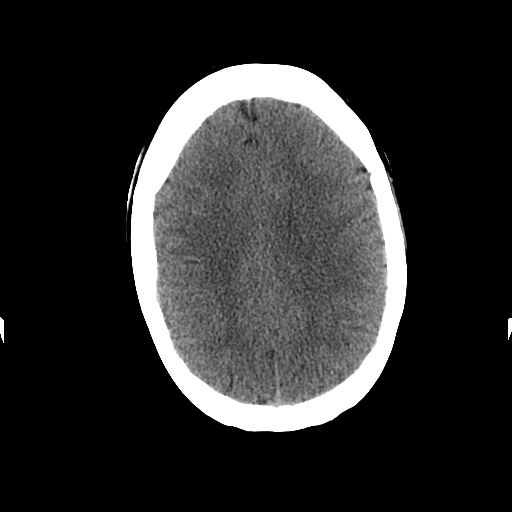
[im 25/32  brain]
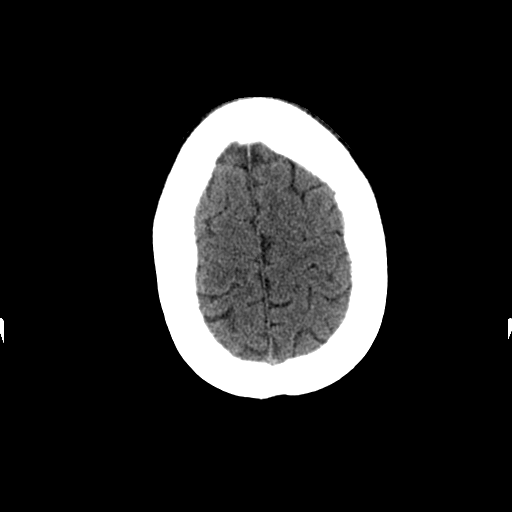
[im 25/32  bone]
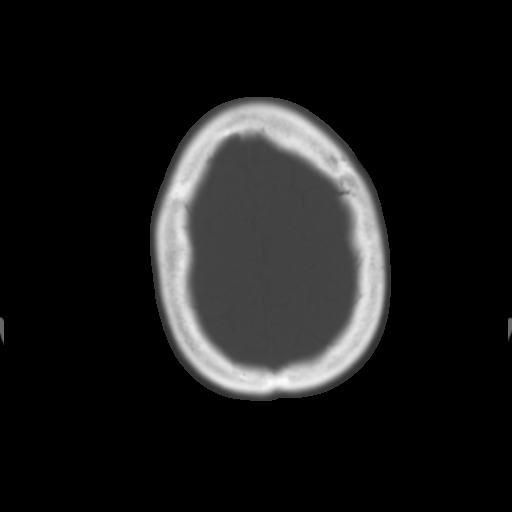
[im 27/32  brain]
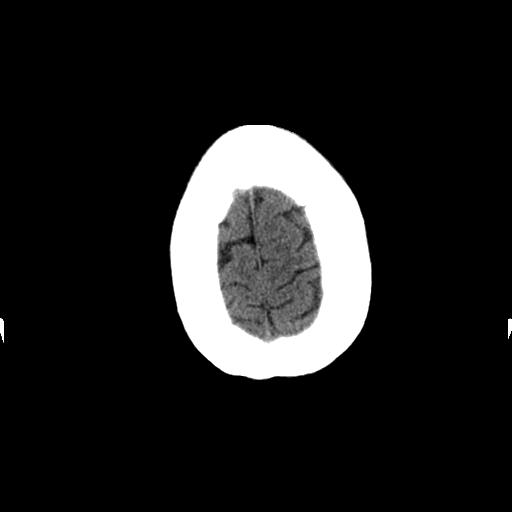
[im 29/32  brain]
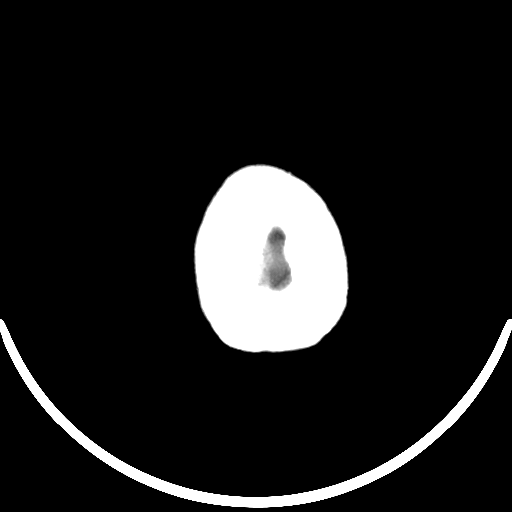

[Series 203: coronal st, idose (1) · coronal · 0.40mm/px · 3 of 80 slices shown]
[im 27/80  brain]
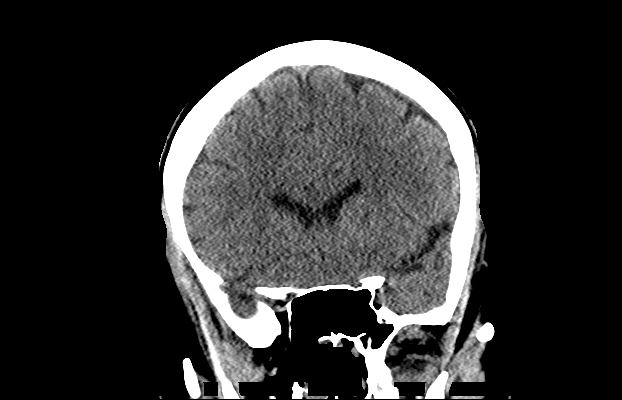
[im 36/80  brain]
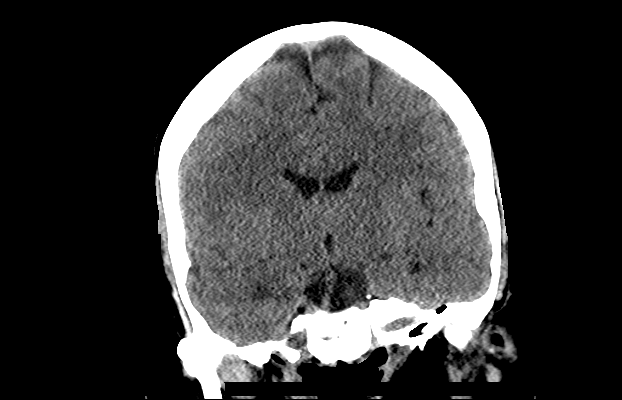
[im 44/80  brain]
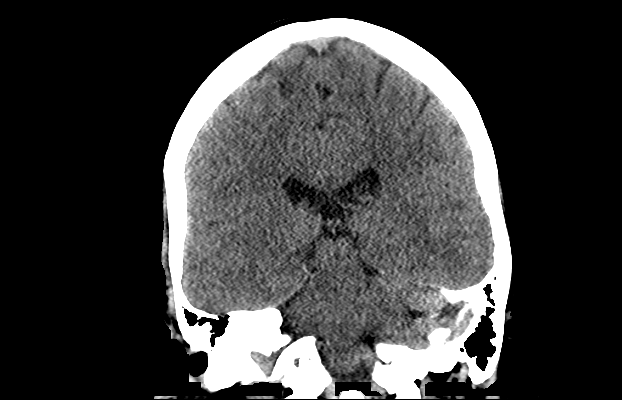

[Series 204: sagittal st, idose (1) · sagittal · 0.40mm/px · 3 of 83 slices shown]
[im 28/83  brain]
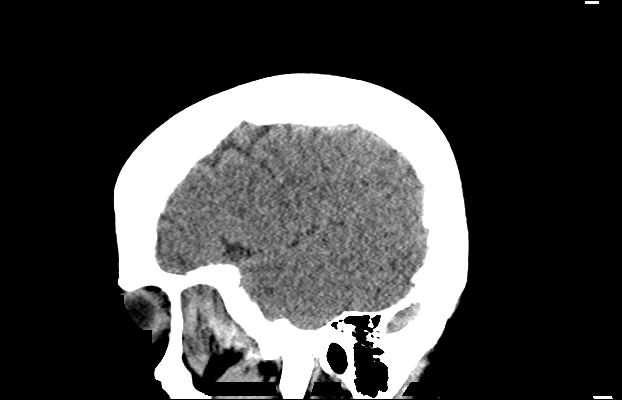
[im 42/83  brain]
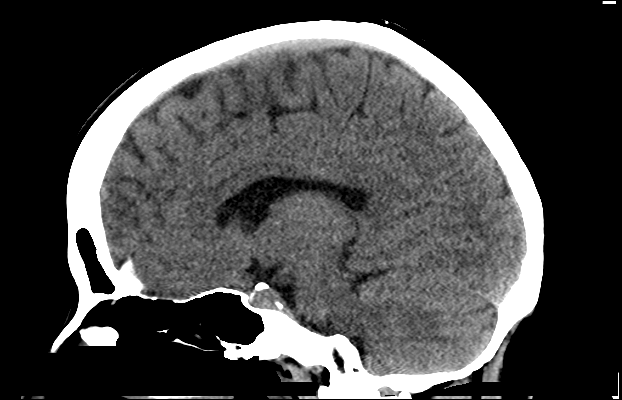
[im 55/83  brain]
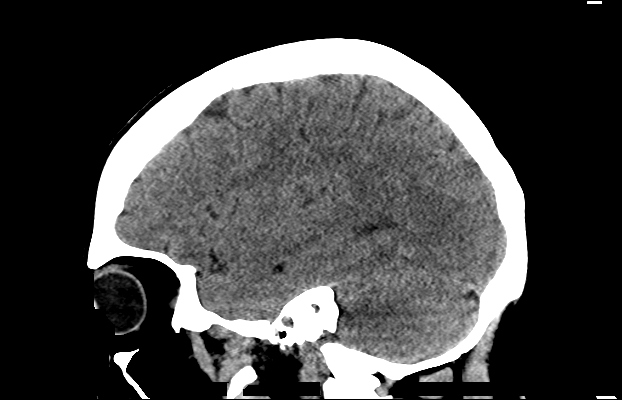

[17 of 47 positions shown; findings below may reference images not displayed]

FINDINGS: Brain: No evidence of acute infarction, hemorrhage, hydrocephalus,
extra-axial collection or mass lesion/mass effect.

Vascular: No hyperdense vessel or unexpected calcification.

Skull: No lesions.

Sinuses/Orbits: Normal orbits. Sinuses are clear as are the mastoid
air cells.

Other: No change from the prior study.
IMPRESSION: 1. Normal CT scan of the brain for age.

## 2017-03-04 DIAGNOSIS — F314 Bipolar disorder, current episode depressed, severe, without psychotic features: Secondary | ICD-10-CM | POA: Diagnosis not present

## 2017-03-07 ENCOUNTER — Ambulatory Visit (INDEPENDENT_AMBULATORY_CARE_PROVIDER_SITE_OTHER): Payer: BLUE CROSS/BLUE SHIELD | Admitting: Internal Medicine

## 2017-03-07 ENCOUNTER — Encounter: Payer: Self-pay | Admitting: Internal Medicine

## 2017-03-07 VITALS — BP 102/64 | HR 64 | Temp 97.3°F | Resp 16 | Ht 66.75 in | Wt 176.4 lb

## 2017-03-07 DIAGNOSIS — E559 Vitamin D deficiency, unspecified: Secondary | ICD-10-CM

## 2017-03-07 DIAGNOSIS — I10 Essential (primary) hypertension: Secondary | ICD-10-CM

## 2017-03-07 DIAGNOSIS — Z8249 Family history of ischemic heart disease and other diseases of the circulatory system: Secondary | ICD-10-CM

## 2017-03-07 DIAGNOSIS — Z0000 Encounter for general adult medical examination without abnormal findings: Secondary | ICD-10-CM | POA: Diagnosis not present

## 2017-03-07 DIAGNOSIS — Z1212 Encounter for screening for malignant neoplasm of rectum: Secondary | ICD-10-CM

## 2017-03-07 DIAGNOSIS — Z136 Encounter for screening for cardiovascular disorders: Secondary | ICD-10-CM

## 2017-03-07 DIAGNOSIS — Z111 Encounter for screening for respiratory tuberculosis: Secondary | ICD-10-CM

## 2017-03-07 DIAGNOSIS — F319 Bipolar disorder, unspecified: Secondary | ICD-10-CM

## 2017-03-07 DIAGNOSIS — R5383 Other fatigue: Secondary | ICD-10-CM

## 2017-03-07 DIAGNOSIS — Z79899 Other long term (current) drug therapy: Secondary | ICD-10-CM | POA: Diagnosis not present

## 2017-03-07 DIAGNOSIS — Z0001 Encounter for general adult medical examination with abnormal findings: Secondary | ICD-10-CM

## 2017-03-07 DIAGNOSIS — E8881 Metabolic syndrome: Secondary | ICD-10-CM

## 2017-03-07 DIAGNOSIS — Z87891 Personal history of nicotine dependence: Secondary | ICD-10-CM

## 2017-03-07 DIAGNOSIS — Z1211 Encounter for screening for malignant neoplasm of colon: Secondary | ICD-10-CM

## 2017-03-07 DIAGNOSIS — E782 Mixed hyperlipidemia: Secondary | ICD-10-CM

## 2017-03-07 NOTE — Patient Instructions (Signed)
Lithium Toxicity  Lithium toxicity, which is also called lithium poisoning, is the condition of having too much lithium in the blood. Lithium is a medicine that is used to treat bipolar disorder. Lithium toxicity happens when you take too much lithium. This condition also happens when you are taking lithium and a condition or another medicine affects the lithium levels in your body. Lithium toxicity can be life threatening. What increases the risk? This condition is more likely to develop in people:  Of young age.  Of older age.  With kidney disease.  With heart disease.  With dehydration that is caused by sweating or diarrhea.  With low blood sodium levels.  Certain medicines can increase the risk for this condition. They include:  Water loss pills (diuretics).  Certain medicines for high blood pressure.  Nonsteroidal anti-inflammatory drugs.  What are the signs or symptoms? The symptoms of mild to moderate lithium toxicity are:  Nausea and vomiting.  Diarrhea.  Drowsiness.  Thirst.  Muscle weakness.  Slurred speech.  Lack of coordination.  Frequent urination.  The symptoms of moderate to severe lithium toxicity are:  Blurred vision.  Giddiness.  Ringing in the ears.  Severe muscle spasms.  Seizures.  Abnormal heart rhythm.  Loss of consciousness or coma.  How is this diagnosed? To make a diagnosis, your health care provider will:  Look at your signs and symptoms.  Ask how much lithium you take, how long you have been taking it, and when you took your last dose.  Order blood tests to check your blood lithium level.  How is this treated? If your lithium toxicity is mild or moderate, your dosage of lithium may be reduced or the medicine may be stopped. If your toxicity is severe, lithium may need to be removed from your body. The removal of lithium is done in a hospital emergency department. It may involve:  Gastric lavage. A tube is placed  through the nose or mouth into the stomach. The tube is used to remove lithium that has not been digested yet. It may also be used to put medicines directly into the stomach to help stop lithium from being absorbed.  Medicines that increase removal of lithium by the kidneys.  Use of an artificial kidney to clean the blood (dialysis). This is usually done only in the most severe cases.  Follow these instructions at home:  Take medicines only as directed by your health care provider.  Drink enough fluid to keep your urine clear or pale yellow. How is this prevented? Lithium toxicity is preventable. To help prevent it from recurring:  Take medicines only as directed by your health care provider.  Drink enough fluid to keep your urine clear or pale yellow.  Talk with your health care provider first if you want to start a low-salt (low-sodium) diet.  Have your blood lithium levels checked regularly.  Watch for signs and symptoms of lithium toxicity. If you have signs or symptoms, get treatment early. This can help to prevent severe symptoms from developing.  Contact a health care provider if:  You have signs or symptoms of mild or moderate lithium toxicity after you receive treatment, even if your blood lithium level is normal. Get help right away if:  Your symptoms worsen.  You have signs or symptoms of severe lithium toxicity after you receive treatment. This information is not intended to replace advice given to you by your health care provider. Make sure you discuss any questions you have with  your health care provider. Document Released: 11/15/2006 Document Revised: 06/26/2015 Document Reviewed: 09/23/2013 Elsevier Interactive Patient Education  2018 [employer].  ++++++++++++++++++++++++++ Preventive Care for Adults  A healthy lifestyle and preventive care can promote health and wellness. Preventive health guidelines for women include the following key practices.  A  routine yearly physical is a good way to check with your health care provider about your health and preventive screening. It is a chance to share any concerns and updates on your health and to receive a thorough exam.  Visit your dentist for a routine exam and preventive care every 6 months. Brush your teeth twice a day and floss once a day. Good oral hygiene prevents tooth decay and gum disease.  The frequency of eye exams is based on your age, health, family medical history, use of contact lenses, and other factors. Follow your health care provider's recommendations for frequency of eye exams.  Eat a healthy diet. Foods like vegetables, fruits, whole grains, low-fat dairy products, and lean protein foods contain the nutrients you need without too many calories. Decrease your intake of foods high in solid fats, added sugars, and salt. Eat the right amount of calories for you.Get information about a proper diet from your health care provider, if necessary.  Regular physical exercise is one of the most important things you can do for your health. Most adults should get at least 150 minutes of moderate-intensity exercise (any activity that increases your heart rate and causes you to sweat) each week. In addition, most adults need muscle-strengthening exercises on 2 or more days a week.  Maintain a healthy weight. The body mass index (BMI) is a screening tool to identify possible weight problems. It provides an estimate of body fat based on height and weight. Your health care provider can find your BMI and can help you achieve or maintain a healthy weight.For adults 20 years and older:  A BMI below 18.5 is considered underweight.  A BMI of 18.5 to 24.9 is normal.  A BMI of 25 to 29.9 is considered overweight.  A BMI of 30 and above is considered obese.  Maintain normal blood lipids and cholesterol levels by exercising and minimizing your intake of saturated fat. Eat a balanced diet with plenty of  fruit and vegetables. Blood tests for lipids and cholesterol should begin at age 1 and be repeated every 5 years. If your lipid or cholesterol levels are high, you are over 50, or you are at high risk for heart disease, you may need your cholesterol levels checked more frequently.Ongoing high lipid and cholesterol levels should be treated with medicines if diet and exercise are not working.  If you smoke, find out from your health care provider how to quit. If you do not use tobacco, do not start.  Lung cancer screening is recommended for adults aged 20-80 years who are at high risk for developing lung cancer because of a history of smoking. A yearly low-dose CT scan of the lungs is recommended for people who have at least a 30-pack-year history of smoking and are a current smoker or have quit within the past 15 years. A pack year of smoking is smoking an average of 1 pack of cigarettes a day for 1 year (for example: 1 pack a day for 30 years or 2 packs a day for 15 years). Yearly screening should continue until the smoker has stopped smoking for at least 15 years. Yearly screening should be stopped for people who develop  a health problem that would prevent them from having lung cancer treatment.  High blood pressure causes heart disease and increases the risk of stroke. Your blood pressure should be checked at least every 1 to 2 years. Ongoing high blood pressure should be treated with medicines if weight loss and exercise do not work.  If you are 57-21 years old, ask your health care provider if you should take aspirin to prevent strokes.  Diabetes screening involves taking a blood sample to check your fasting blood sugar level. This should be done once every 3 years, after age 13, if you are within normal weight and without risk factors for diabetes. Testing should be considered at a younger age or be carried out more frequently if you are overweight and have at least 1 risk factor for  diabetes.  Breast cancer screening is essential preventive care for women. You should practice "breast self-awareness." This means understanding the normal appearance and feel of your breasts and may include breast self-examination. Any changes detected, no matter how small, should be reported to a health care provider. Women in their 69s and 30s should have a clinical breast exam (CBE) by a health care provider as part of a regular health exam every 1 to 3 years. After age 38, women should have a CBE every year. Starting at age 96, women should consider having a mammogram (breast X-ray test) every year. Women who have a family history of breast cancer should talk to their health care provider about genetic screening. Women at a high risk of breast cancer should talk to their health care providers about having an MRI and a mammogram every year.  Breast cancer gene (BRCA)-related cancer risk assessment is recommended for women who have family members with BRCA-related cancers. BRCA-related cancers include breast, ovarian, tubal, and peritoneal cancers. Having family members with these cancers may be associated with an increased risk for harmful changes (mutations) in the breast cancer genes BRCA1 and BRCA2. Results of the assessment will determine the need for genetic counseling and BRCA1 and BRCA2 testing.  Routine pelvic exams to screen for cancer are no longer recommended for nonpregnant women who are considered low risk for cancer of the pelvic organs (ovaries, uterus, and vagina) and who do not have symptoms. Ask your health care provider if a screening pelvic exam is right for you.  If you have had past treatment for cervical cancer or a condition that could lead to cancer, you need Pap tests and screening for cancer for at least 20 years after your treatment. If Pap tests have been discontinued, your risk factors (such as having a new sexual partner) need to be reassessed to determine if screening  should be resumed. Some women have medical problems that increase the chance of getting cervical cancer. In these cases, your health care provider may recommend more frequent screening and Pap tests.  Colorectal cancer can be detected and often prevented. Most routine colorectal cancer screening begins at the age of 46 years and continues through age 38 years. However, your health care provider may recommend screening at an earlier age if you have risk factors for colon cancer. On a yearly basis, your health care provider may provide home test kits to check for hidden blood in the stool. Use of a small camera at the end of a tube, to directly examine the colon (sigmoidoscopy or colonoscopy), can detect the earliest forms of colorectal cancer. Talk to your health care provider about this at age 1,  when routine screening begins. Direct exam of the colon should be repeated every 5-10 years through age 50 years, unless early forms of pre-cancerous polyps or small growths are found.  Hepatitis C blood testing is recommended for all people born from [birth year] through 1965 and any individual with known risks for hepatitis C.  Pra  Osteoporosis is a disease in which the bones lose minerals and strength with aging. This can result in serious bone fractures or breaks. The risk of osteoporosis can be identified using a bone density scan. Women ages 38 years and over and women at risk for fractures or osteoporosis should discuss screening with their health care providers. Ask your health care provider whether you should take a calcium supplement or vitamin D to reduce the rate of osteoporosis.  Menopause can be associated with physical symptoms and risks. Hormone replacement therapy is available to decrease symptoms and risks. You should talk to your health care provider about whether hormone replacement therapy is right for you.  Use sunscreen. Apply sunscreen liberally and repeatedly throughout the day. You should  seek shade when your shadow is shorter than you. Protect yourself by wearing long sleeves, pants, a wide-brimmed hat, and sunglasses year round, whenever you are outdoors.  Once a month, do a whole body skin exam, using a mirror to look at the skin on your back. Tell your health care provider of new moles, moles that have irregular borders, moles that are larger than a pencil eraser, or moles that have changed in shape or color.  Stay current with required vaccines (immunizations).  Influenza vaccine. All adults should be immunized every year.  Tetanus, diphtheria, and acellular pertussis (Td, Tdap) vaccine. Pregnant women should receive 1 dose of Tdap vaccine during each pregnancy. The dose should be obtained regardless of the length of time since the last dose. Immunization is preferred during the 27th-36th week of gestation. An adult who has not previously received Tdap or who does not know her vaccine status should receive 1 dose of Tdap. This initial dose should be followed by tetanus and diphtheria toxoids (Td) booster doses every 10 years. Adults with an unknown or incomplete history of completing a 3-dose immunization series with Td-containing vaccines should begin or complete a primary immunization series including a Tdap dose. Adults should receive a Td booster every 10 years.  Varicella vaccine. An adult without evidence of immunity to varicella should receive 2 doses or a second dose if she has previously received 1 dose. Pregnant females who do not have evidence of immunity should receive the first dose after pregnancy. This first dose should be obtained before leaving the health care facility. The second dose should be obtained 4-8 weeks after the first dose.  Human papillomavirus (HPV) vaccine. Females aged 13-26 years who have not received the vaccine previously should obtain the 3-dose series. The vaccine is not recommended for use in pregnant females. However, pregnancy testing is not  needed before receiving a dose. If a female is found to be pregnant after receiving a dose, no treatment is needed. In that case, the remaining doses should be delayed until after the pregnancy. Immunization is recommended for any person with an immunocompromised condition through the age of 48 years if she did not get any or all doses earlier. During the 3-dose series, the second dose should be obtained 4-8 weeks after the first dose. The third dose should be obtained 24 weeks after the first dose and 16 weeks after the second dose.  Zoster vaccine. One dose is recommended for adults aged 43 years or older unless certain conditions are present.  Measles, mumps, and rubella (MMR) vaccine. Adults born before 75 generally are considered immune to measles and mumps. Adults born in [birth year] or later should have 1 or more doses of MMR vaccine unless there is a contraindication to the vaccine or there is laboratory evidence of immunity to each of the three diseases. A routine second dose of MMR vaccine should be obtained at least 28 days after the first dose for students attending postsecondary schools, health care workers, or international travelers. People who received inactivated measles vaccine or an unknown type of measles vaccine during 1963-1967 should receive 2 doses of MMR vaccine. People who received inactivated mumps vaccine or an unknown type of mumps vaccine before 1979 and are at high risk for mumps infection should consider immunization with 2 doses of MMR vaccine. For females of childbearing age, rubella immunity should be determined. If there is no evidence of immunity, females who are not pregnant should be vaccinated. If there is no evidence of immunity, females who are pregnant should delay immunization until after pregnancy. Unvaccinated health care workers born before 35 who lack laboratory evidence of measles, mumps, or rubella immunity or laboratory confirmation of disease should consider  measles and mumps immunization with 2 doses of MMR vaccine or rubella immunization with 1 dose of MMR vaccine.  Pneumococcal 13-valent conjugate (PCV13) vaccine. When indicated, a person who is uncertain of her immunization history and has no record of immunization should receive the PCV13 vaccine. An adult aged 22 years or older who has certain medical conditions and has not been previously immunized should receive 1 dose of PCV13 vaccine. This PCV13 should be followed with a dose of pneumococcal polysaccharide (PPSV23) vaccine. The PPSV23 vaccine dose should be obtained at least 8 weeks after the dose of PCV13 vaccine. An adult aged 6 years or older who has certain medical conditions and previously received 1 or more doses of PPSV23 vaccine should receive 1 dose of PCV13. The PCV13 vaccine dose should be obtained 1 or more years after the last PPSV23 vaccine dose.    Pneumococcal polysaccharide (PPSV23) vaccine. When PCV13 is also indicated, PCV13 should be obtained first. All adults aged 52 years and older should be immunized. An adult younger than age 46 years who has certain medical conditions should be immunized. Any person who resides in a nursing home or long-term care facility should be immunized. An adult smoker should be immunized. People with an immunocompromised condition and certain other conditions should receive both PCV13 and PPSV23 vaccines. People with human immunodeficiency virus (HIV) infection should be immunized as soon as possible after diagnosis. Immunization during chemotherapy or radiation therapy should be avoided. Routine use of PPSV23 vaccine is not recommended for American Indians, [place] Natives, or people younger than 65 years unless there are medical conditions that require PPSV23 vaccine. When indicated, people who have unknown immunization and have no record of immunization should receive PPSV23 vaccine. One-time revaccination 5 years after the first dose of PPSV23 is  recommended for people aged 19-64 years who have chronic kidney failure, nephrotic syndrome, asplenia, or immunocompromised conditions. People who received 1-2 doses of PPSV23 before age 82 years should receive another dose of PPSV23 vaccine at age 44 years or later if at least 5 years have passed since the previous dose. Doses of PPSV23 are not needed for people immunized with PPSV23 at or after age 35 years.  Preventive Services / Frequency   Ages 19 to 59 years  Blood pressure check.  Lipid and cholesterol check.  Lung cancer screening. / Every year if you are aged 70-80 years and have a 30-pack-year history of smoking and currently smoke or have quit within the past 15 years. Yearly screening is stopped once you have quit smoking for at least 15 years or develop a health problem that would prevent you from having lung cancer treatment.  Clinical breast exam.** / Every year after age 109 years.  BRCA-related cancer risk assessment.** / For women who have family members with a BRCA-related cancer (breast, ovarian, tubal, or peritoneal cancers).  Mammogram.** / Every year beginning at age 25 years and continuing for as long as you are in good health. Consult with your health care provider.  Pap test.** / Every 3 years starting at age 71 years through age 67 or 65 years with a history of 3 consecutive normal Pap tests.  HPV screening.** / Every 3 years from ages 93 years through ages 66 to 20 years with a history of 3 consecutive normal Pap tests.  Fecal occult blood test (FOBT) of stool. / Every year beginning at age 25 years and continuing until age 42 years. You may not need to do this test if you get a colonoscopy every 10 years.  Flexible sigmoidoscopy or colonoscopy.** / Every 5 years for a flexible sigmoidoscopy or every 10 years for a colonoscopy beginning at age 26 years and continuing until age 47 years.  Hepatitis C blood test.** / For all people born from [birth year] through 1963 and  any individual with known risks for hepatitis C.  Skin self-exam. / Monthly.  Influenza vaccine. / Every year.  Tetanus, diphtheria, and acellular pertussis (Tdap/Td) vaccine.** / Consult your health care provider. Pregnant women should receive 1 dose of Tdap vaccine during each pregnancy. 1 dose of Td every 10 years.  Varicella vaccine.** / Consult your health care provider. Pregnant females who do not have evidence of immunity should receive the first dose after pregnancy.  Zoster vaccine.** / 1 dose for adults aged 75 years or older.  Pneumococcal 13-valent conjugate (PCV13) vaccine.** / Consult your health care provider.  Pneumococcal polysaccharide (PPSV23) vaccine.** / 1 to 2 doses if you smoke cigarettes or if you have certain conditions.  Meningococcal vaccine.** / Consult your health care provider.  Hepatitis A vaccine.** / Consult your health care provider.  Hepatitis B vaccine.** / Consult your health care provider. Screening for abdominal aortic aneurysm (AAA)  by ultrasound is recommended for people over 50 who have history of high blood pressure or who are current or former smokers. ++++++++++++++++++ Recommend Adult Low Dose Aspirin or  coated  Aspirin 81 mg daily  To reduce risk of Colon Cancer 20 %,  Skin Cancer 26 % ,  Melanoma 46%  and  Pancreatic cancer 60% +++++++++++++++++++ Vitamin D goal  is between 70-100.  Please make sure that you are taking your Vitamin D as directed.  It is very important as a natural anti-inflammatory  helping hair, skin, and nails, as well as reducing stroke and heart attack risk.  It helps your bones and helps with mood. It also decreases numerous cancer risks so please take it as directed.  Low Vit D is associated with a 200-300% higher risk for CANCER  and 200-300% higher risk for HEART   ATTACK  &  STROKE.   .....................................Marland Kitchen It is also associated with higher death rate at  younger ages,  autoimmune  diseases like Rheumatoid arthritis, Lupus, Multiple Sclerosis.    Also many other serious conditions, like depression, Alzheimer's Dementia, infertility, muscle aches, fatigue, fibromyalgia - just to name a few. ++++++++++++++++++ Recommend the book "The END of DIETING" by Dr Excell Seltzer  & the book "The END of DIABETES " by Dr Excell Seltzer At [email or web address] - get book & Audio CD's    Being diabetic has a  300% increased risk for heart attack, stroke, cancer, and alzheimer- type vascular dementia. It is very important that you work harder with diet by avoiding all foods that are white. Avoid white rice (brown & wild rice is OK), white potatoes (sweetpotatoes in moderation is OK), White bread or wheat bread or anything made out of white flour like bagels, donuts, rolls, buns, biscuits, cakes, pastries, cookies, pizza crust, and pasta (made from white flour & egg whites) - vegetarian pasta or spinach or wheat pasta is OK. Multigrain breads like Arnold's or [employer], or multigrain sandwich thins or flatbreads.  Diet, exercise and weight loss can reverse and cure diabetes in the early stages.  Diet, exercise and weight loss is very important in the control and prevention of complications of diabetes which affects every system in your body, ie. Brain - dementia/stroke, eyes - glaucoma/blindness, heart - heart attack/heart failure, kidneys - dialysis, stomach - gastric paralysis, intestines - malabsorption, nerves - severe painful neuritis, circulation - gangrene & loss of a leg(s), and finally cancer and Alzheimers.    I recommend avoid fried & greasy foods,  sweets/candy, white rice (brown or wild rice or Quinoa is OK), white potatoes (sweet potatoes are OK) - anything made from white flour - bagels, doughnuts, rolls, buns, biscuits,white and wheat breads, pizza crust and traditional pasta made of white flour & egg white(vegetarian pasta or spinach or wheat pasta is OK).  Multi-grain bread is OK - like  multi-grain flat bread or sandwich thins. Avoid alcohol in excess. Exercise is also important.    Eat all the vegetables you want - avoid meat, especially red meat and dairy - especially cheese.  Cheese is the most concentrated form of trans-fats which is the worst thing to clog up our arteries. Veggie cheese is OK which can be found in the fresh produce section at [employer] or [employer] or [employer]  ++++++++++++++++++++++ DASH Eating Plan  DASH stands for "Dietary Approaches to Stop Hypertension."   The DASH eating plan is a healthy eating plan that has been shown to reduce high blood pressure (hypertension). Additional health benefits may include reducing the risk of type 2 diabetes mellitus, heart disease, and stroke. The DASH eating plan may also help with weight loss. WHAT DO I NEED TO KNOW ABOUT THE DASH EATING PLAN? For the DASH eating plan, you will follow these general guidelines:  Choose foods with a percent daily value for sodium of less than 5% (as listed on the food label).  Use salt-free seasonings or herbs instead of table salt or sea salt.  Check with your health care provider or pharmacist before using salt substitutes.  Eat lower-sodium products, often labeled as "lower sodium" or "no salt added."  Eat fresh foods.  Eat more vegetables, fruits, and low-fat dairy products.  Choose whole grains. Look for the word "whole" as the first word in the ingredient list.  Choose fish   Limit sweets, desserts, sugars, and sugary drinks.  Choose heart-healthy fats.  Eat veggie cheese   Eat more home-cooked food  and less restaurant, buffet, and fast food.  Limit fried foods.  Cook foods using methods other than frying.  Limit canned vegetables. If you do use them, rinse them well to decrease the sodium.  When eating at a restaurant, ask that your food be prepared with less salt, or no salt if possible.                      WHAT FOODS CAN I EAT? Read Dr Fara Olden  Fuhrman's books on The End of Dieting & The End of Diabetes  Grains Whole grain or whole wheat bread. Brown rice. Whole grain or whole wheat pasta. Quinoa, bulgur, and whole grain cereals. Low-sodium cereals. Corn or whole wheat flour tortillas. Whole grain cornbread. Whole grain crackers. Low-sodium crackers.  Vegetables Fresh or frozen vegetables (raw, steamed, roasted, or grilled). Low-sodium or reduced-sodium tomato and vegetable juices. Low-sodium or reduced-sodium tomato sauce and paste. Low-sodium or reduced-sodium canned vegetables.   Fruits All fresh, canned (in natural juice), or frozen fruits.  Protein Products  All fish and seafood.  Dried beans, peas, or lentils. Unsalted nuts and seeds. Unsalted canned beans.  Dairy Low-fat dairy products, such as skim or 1% milk, 2% or reduced-fat cheeses, low-fat ricotta or cottage cheese, or plain low-fat yogurt. Low-sodium or reduced-sodium cheeses.  Fats and Oils Tub margarines without trans fats. Light or reduced-fat mayonnaise and salad dressings (reduced sodium). Avocado. Safflower, olive, or canola oils. Natural peanut or almond butter.  Other Unsalted popcorn and pretzels. The items listed above may not be a complete list of recommended foods or beverages. Contact your dietitian for more options.  ++++++++++++++++++  WHAT FOODS ARE NOT RECOMMENDED? Grains/ White flour or wheat flour White bread. White pasta. White rice. Refined cornbread. Bagels and croissants. Crackers that contain trans fat.  Vegetables  Creamed or fried vegetables. Vegetables in a . Regular canned vegetables. Regular canned tomato sauce and paste. Regular tomato and vegetable juices.  Fruits Dried fruits. Canned fruit in light or heavy syrup. Fruit juice.  Meat and Other Protein Products Meat in general - RED meat & White meat.  Fatty cuts of meat. Ribs, chicken wings, all processed meats as bacon, sausage, bologna, salami, fatback, hot dogs, bratwurst  and packaged luncheon meats.  Dairy Whole or 2% milk, cream, half-and-half, and cream cheese. Whole-fat or sweetened yogurt. Full-fat cheeses or blue cheese. Non-dairy creamers and whipped toppings. Processed cheese, cheese spreads, or cheese curds.  Condiments Onion and garlic salt, seasoned salt, table salt, and sea salt. Canned and packaged gravies. Worcestershire sauce. Tartar sauce. Barbecue sauce. Teriyaki sauce. Soy sauce, including reduced sodium. Steak sauce. Fish sauce. Oyster sauce. Cocktail sauce. Horseradish. Ketchup and mustard. Meat flavorings and tenderizers. Bouillon cubes. Hot sauce. Tabasco sauce. Marinades. Taco seasonings. Relishes.  Fats and Oils Butter, stick margarine, lard, shortening and bacon fat. Coconut, palm kernel, or palm oils. Regular salad dressings.  Pickles and olives. Salted popcorn and pretzels.  The items listed above may not be a complete list of foods and beverages to avoid.

## 2017-03-07 NOTE — Progress Notes (Signed)
[facility] Lucky Cowboy, M.D.     Dyanne Carrel. Steffanie Dunn, P.A.-C Judd Gaudier, DNP [facility] [address] [place], [place]. [address] Telephone [phone] Telefax [phone] Annual Screening/Preventative Visit & Comprehensive Evaluation &  Examination           This very nice 57 y.o. MWF presents for a Screening/Preventative Visit & comprehensive evaluation and management of multiple medical co-morbidities.  Patient has been followed for HTN, Prediabetes, Hyperlipidemia and Vitamin D Deficiency.  Patient s followed at [facility] by Lowell Guitar, PA-C  On Lithium for a bipolar disorder.       HTN predates since 2008. Patient's BP has been controlled at home and patient denies any cardiac symptoms as chest pain, palpitations, shortness of breath, dizziness or ankle swelling. Today's BP is at goal -  102/64.      Patient's hyperlipidemia is controlled with diet and medications. Patient denies myalgias or other medication SE's. Last lipids were not at goal :  Lab Results  Component Value Date   CHOL 221 (H) 09/28/2016   HDL 41 (L) 09/28/2016   LDLCALC 135 (H) 03/04/2016   TRIG 260 (H) 09/28/2016   CHOLHDL 5.4 (H) 09/28/2016      Patient has prediabetes/Insulin Resistance (A1c 5.4%/Insulin 42 in 2014) and patient denies reactive hypoglycemic symptoms, visual blurring, diabetic polys, or paresthesias. Last A1c was 4.9% at goal with a normal Insulin 14.9.      Finally, patient has history of Vitamin D Deficiency ("19"/2010) and last Vitamin D was at goal:   Lab Results  Component Value Date   VD25OH 75 03/04/2016   Current Outpatient Medications on File Prior to Visit  Medication Sig  . BABY ASPIRIN PO Take 81 mg by mouth 2 (two) times daily.   . Calcium Carbonate (CALCIUM 600 PO) Take 600 mg by mouth daily.  . Cholecalciferol (VITAMIN D PO) Take 10,000 Units by mouth daily.  . ferrous sulfate 325  (65 FE) MG tablet Take 1 tablet (325 mg total) by mouth daily with breakfast.  . fluticasone (FLONASE) 50 MCG/ACT nasal spray Place 1 spray into both nostrils daily.  Marland Kitchen lisinopril-hydrochlorothiazide (PRINZIDE,ZESTORETIC) 10-12.5 MG tablet TAKE 1 TABLET BY MOUTH ONCE DAILY  . lithium carbonate (LITHOBID) 300 MG CR tablet Take 2 tablets (600 mg total) by mouth at bedtime.  . progesterone (PROMETRIUM) 200 MG capsule   . QUEtiapine (SEROQUEL) 50 MG tablet 50 mg. Takes 1/2 tablet daily   No current facility-administered medications on file prior to visit.    Allergies  Allergen Reactions  . Prednisolone Other (See Comments)    Causes a reverse reaction   Past Medical History:  Diagnosis Date  . Bipolar disorder (HCC)   . Hemorrhoids   . Hypertension   . Insulin resistance   . Vitamin D deficiency    Health Maintenance  Topic Date Due  . Hepatitis C Screening  May 23, 1960  . HIV Screening  12/18/1977  . PAP SMEAR  12/19/1983  . COLONOSCOPY  12/18/2012  . INFLUENZA VACCINE  09/01/2016  . TETANUS/TDAP  07/18/2017  . MAMMOGRAM  12/03/2018   Immunization History  Administered Date(s) Administered  . Influenza Split 10/24/2012, 12/10/2013  . Influenza, Seasonal, Injecte, Preservative Fre 12/24/2014  . Influenza,inj,quad, With Preservative 03/04/2016  . PPD Test 12/10/2013, 12/24/2014, 03/04/2016  . Pneumococcal Polysaccharide-23 10/24/2012  . Tdap 07/19/2007   Last Colon - Never - agrees to Cologard  Last MGM - 12/02/2016  Past  Surgical History:  Procedure Laterality Date  . TONSILLECTOMY AND ADENOIDECTOMY     Family History  Problem Relation Age of Onset  . Cataracts Mother   . Osteoporosis Mother   . Heart disease Father   . Hypertension Father   . Diabetes Father    Social History   Tobacco Use  . Smoking status: Former Smoker    Types: Cigarettes    Last attempt to quit: 02/08/2016    Years since quitting: 1.0  . Smokeless tobacco: Never Used  . Tobacco  comment: uses e-cigs  Substance Use Topics  . Alcohol use: Yes    Alcohol/week: 1.8 oz    Types: 3 Standard drinks or equivalent per week    Comment: "a few times a week"  . Drug use: No    ROS Constitutional: Denies fever, chills, weight loss/gain, headaches, insomnia,  night sweats, and change in appetite. Does c/o fatigue. Eyes: Denies redness, blurred vision, diplopia, discharge, itchy, watery eyes.  ENT: Denies discharge, congestion, post nasal drip, epistaxis, sore throat, earache, hearing loss, dental pain, Tinnitus, Vertigo, Sinus pain, snoring.  Cardio: Denies chest pain, palpitations, irregular heartbeat, syncope, dyspnea, diaphoresis, orthopnea, PND, claudication, edema Respiratory: denies cough, dyspnea, DOE, pleurisy, hoarseness, laryngitis, wheezing.  Gastrointestinal: Denies dysphagia, heartburn, reflux, water brash, pain, cramps, nausea, vomiting, bloating, diarrhea, constipation, hematemesis, melena, hematochezia, jaundice, hemorrhoids Genitourinary: Denies dysuria, frequency, urgency, nocturia, hesitancy, discharge, hematuria, flank pain Breast: Breast lumps, nipple discharge, bleeding.  Musculoskeletal: Denies arthralgia, myalgia, stiffness, Jt. Swelling, pain, limp, and strain/sprain. Denies falls. Skin: Denies puritis, rash, hives, warts, acne, eczema, changing in skin lesion Neuro: No weakness, tremor, incoordination, spasms, paresthesia, pain Psychiatric: Denies confusion, memory loss, sensory loss. Denies Depression. Endocrine: Denies change in weight, skin, hair change, nocturia, and paresthesia, diabetic polys, visual blurring, hyper / hypo glycemic episodes.  Heme/Lymph: No excessive bleeding, bruising, enlarged lymph nodes.  Physical Exam  BP 102/64   Pulse 64   Temp (!) 97.3 F (36.3 C)   Resp 16   Ht 5' 6.75" (1.695 m)   Wt 176 lb 6.4 oz (80 kg)   BMI 27.84 kg/m   General Appearance: Well nourished, well groomed and in no apparent distress.  Eyes:  PERRLA, EOMs, conjunctiva no swelling or erythema, normal fundi and vessels. Sinuses: No frontal/maxillary tenderness ENT/Mouth: EACs patent / TMs  nl. Nares clear without erythema, swelling, mucoid exudates. Oral hygiene is good. No erythema, swelling, or exudate. Tongue normal, non-obstructing. Tonsils not swollen or erythematous. Hearing normal.  Neck: Supple, thyroid normal. No bruits, nodes or JVD. Respiratory: Respiratory effort normal.  BS equal and clear bilateral without rales, rhonci, wheezing or stridor. Cardio: Heart sounds are normal with regular rate and rhythm and no murmurs, rubs or gallops. Peripheral pulses are normal and equal bilaterally without edema. No aortic or femoral bruits. Chest: symmetric with normal excursions and percussion. Breasts: Symmetric, without lumps, nipple discharge, retractions, or fibrocystic changes.  Abdomen: Flat, soft with bowel sounds active. Nontender, no guarding, rebound, hernias, masses, or organomegaly.  Lymphatics: Non tender without lymphadenopathy.  Musculoskeletal: Full ROM all peripheral extremities, joint stability, 5/5 strength, and normal gait. Skin: Warm and dry without rashes, lesions, cyanosis, clubbing or  ecchymosis.  Neuro: Cranial nerves intact, reflexes equal bilaterally. Normal muscle tone, no cerebellar symptoms. Sensation intact.  Pysch: Alert and oriented X 3, normal affect, Insight and Judgment appropriate.   Assessment and Plan  1. Annual Preventative Screening Examination  2. Essential hypertension  - EKG 12-Lead - [place],  RETROPERITNL ABD,  LTD - Urinalysis, Routine w reflex microscopic - Microalbumin / creatinine urine ratio - CBC with Differential/Platelet - BASIC METABOLIC PANEL WITH GFR - Magnesium - TSH  3. Hyperlipidemia, mixed  - EKG 12-Lead - US, RETROPERITNL ABD,  LTD - Hepatic function panel - Lipid panel - TSH  4. Insulin resistance  - EKG 12-Lead - US, RETROPERITNL ABD,  LTD - Hemoglobin  A1c - Insulin, random  5. Vitamin D deficiency  - VITAMIN D 25 Hydroxy   6. Bipolar affective disorder, remission status unspecified (HCC)  - Lithium level  7. Screening for colorectal cancer  - POC Hemoccult Bld/Stl   8. Screening for ischemic heart disease  - EKG 12-Lead  9. Screening for AAA (aortic abdominal aneurysm)  - US, RETROPERITNL ABD,  LTD  10. Former smoker  - EKG 12-Lead - US, RETROPERITNL ABD,  LTD  11. FHx: heart disease  - EKG 12-Lead - US, RETROPERITNL ABD,  LTD  12. Fatigue  - Iron,Total/Total Iron Binding Cap - Vitamin B12 - CBC with Differential/Platelet  13. Medication management  - Urinalysis, Routine w reflex microscopic - Microalbumin / creatinine urine ratio - CBC with Differential/Platelet - BASIC METABOLIC PANEL WITH GFR - Hepatic function panel - Magnesium - Lipid panel - TSH - Hemoglobin A1c - Insulin, random - VITAMIN D 25 Hydroxy - Lithium level  14. Screening examination for pulmonary tuberculosis  - PPD         Patient was counseled in prudent diet to achieve/maintain BMI less than 25 for weight control, BP monitoring, regular exercise and medications. Discussed med's effects and SE's. Screening labs and tests as requested with regular follow-up as recommended. Over 40 minutes of exam, counseling, chart review and high complex critical decision making was performed.

## 2017-03-08 ENCOUNTER — Other Ambulatory Visit: Payer: Self-pay | Admitting: Internal Medicine

## 2017-03-08 DIAGNOSIS — E782 Mixed hyperlipidemia: Secondary | ICD-10-CM

## 2017-03-08 LAB — HEMOGLOBIN A1C
HEMOGLOBIN A1C: 5.1 %{Hb} (ref <5.7)
MEAN PLASMA GLUCOSE: 100 (calc)
eAG (mmol/L): 5.5 (calc)

## 2017-03-08 LAB — CBC WITH DIFFERENTIAL/PLATELET
BASOS ABS: 62 {cells}/uL (ref 0–200)
Basophils Relative: 1.3 %
EOS ABS: 264 {cells}/uL (ref 15–500)
Eosinophils Relative: 5.5 %
HCT: 38.9 % (ref 35.0–45.0)
HEMOGLOBIN: 12.7 g/dL (ref 11.7–15.5)
Lymphs Abs: 1613 cells/uL (ref 850–3900)
MCH: 28.3 pg (ref 27.0–33.0)
MCHC: 32.6 g/dL (ref 32.0–36.0)
MCV: 86.8 fL (ref 80.0–100.0)
MONOS PCT: 6.1 %
MPV: 10.5 fL (ref 7.5–12.5)
NEUTROS ABS: 2568 {cells}/uL (ref 1500–7800)
Neutrophils Relative %: 53.5 %
Platelets: 218 10*3/uL (ref 140–400)
RBC: 4.48 10*6/uL (ref 3.80–5.10)
RDW: 13 % (ref 11.0–15.0)
Total Lymphocyte: 33.6 %
WBC mixed population: 293 cells/uL (ref 200–950)
WBC: 4.8 10*3/uL (ref 3.8–10.8)

## 2017-03-08 LAB — HEPATIC FUNCTION PANEL
AG Ratio: 2.2 (calc) (ref 1.0–2.5)
ALBUMIN MSPROF: 4.7 g/dL (ref 3.6–5.1)
ALT: 17 U/L (ref 6–29)
AST: 18 U/L (ref 10–35)
Alkaline phosphatase (APISO): 56 U/L (ref 33–130)
BILIRUBIN DIRECT: 0.1 mg/dL (ref 0.0–0.2)
BILIRUBIN INDIRECT: 0.7 mg/dL (ref 0.2–1.2)
BILIRUBIN TOTAL: 0.8 mg/dL (ref 0.2–1.2)
GLOBULIN: 2.1 g/dL (ref 1.9–3.7)
Total Protein: 6.8 g/dL (ref 6.1–8.1)

## 2017-03-08 LAB — URINALYSIS, ROUTINE W REFLEX MICROSCOPIC
Bilirubin Urine: NEGATIVE
Glucose, UA: NEGATIVE
HGB URINE DIPSTICK: NEGATIVE
Ketones, ur: NEGATIVE
LEUKOCYTES UA: NEGATIVE
NITRITE: NEGATIVE
PH: 7 (ref 5.0–8.0)
PROTEIN: NEGATIVE
Specific Gravity, Urine: 1.013 (ref 1.001–1.03)

## 2017-03-08 LAB — LIPID PANEL
Cholesterol: 223 mg/dL — ABNORMAL HIGH (ref <200)
HDL: 43 mg/dL — ABNORMAL LOW (ref >50)
LDL CHOLESTEROL (CALC): 146 mg/dL — AB
NON-HDL CHOLESTEROL (CALC): 180 mg/dL — AB (ref <130)
TRIGLYCERIDES: 204 mg/dL — AB (ref <150)
Total CHOL/HDL Ratio: 5.2 (calc) — ABNORMAL HIGH (ref <5.0)

## 2017-03-08 LAB — BASIC METABOLIC PANEL WITH GFR
BUN: 22 mg/dL (ref 7–25)
CALCIUM: 11.1 mg/dL — AB (ref 8.6–10.4)
CHLORIDE: 103 mmol/L (ref 98–110)
CO2: 31 mmol/L (ref 20–32)
Creat: 1 mg/dL (ref 0.50–1.05)
GFR, EST AFRICAN AMERICAN: 74 mL/min/{1.73_m2} (ref >=60)
GFR, EST NON AFRICAN AMERICAN: 64 mL/min/{1.73_m2} (ref >=60)
Glucose, Bld: 90 mg/dL (ref 65–99)
POTASSIUM: 5.1 mmol/L (ref 3.5–5.3)
Sodium: 139 mmol/L (ref 135–146)

## 2017-03-08 LAB — LITHIUM LEVEL: Lithium Lvl: 1 mmol/L (ref 0.6–1.2)

## 2017-03-08 LAB — VITAMIN D 25 HYDROXY (VIT D DEFICIENCY, FRACTURES): Vit D, 25-Hydroxy: 75 ng/mL (ref 30–100)

## 2017-03-08 LAB — VITAMIN B12

## 2017-03-08 LAB — MICROALBUMIN / CREATININE URINE RATIO
Creatinine, Urine: 94 mg/dL (ref 20–275)
MICROALB UR: 0.6 mg/dL
MICROALB/CREAT RATIO: 6 ug/mg{creat} (ref <30)

## 2017-03-08 LAB — TSH: TSH: 2.02 m[IU]/L

## 2017-03-08 LAB — IRON, TOTAL/TOTAL IRON BINDING CAP
%SAT: 27 % (ref 11–50)
Iron: 96 ug/dL (ref 45–160)
TIBC: 360 mcg/dL (calc) (ref 250–450)

## 2017-03-08 LAB — MAGNESIUM: Magnesium: 2 mg/dL (ref 1.5–2.5)

## 2017-03-08 LAB — INSULIN, RANDOM: Insulin: 8.5 u[IU]/mL (ref 2.0–19.6)

## 2017-03-08 MED ORDER — ROSUVASTATIN CALCIUM 40 MG PO TABS: ORAL_TABLET | ORAL | 5 refills | Status: DC

## 2017-03-09 ENCOUNTER — Encounter: Payer: Self-pay | Admitting: *Deleted

## 2017-03-10 DIAGNOSIS — F314 Bipolar disorder, current episode depressed, severe, without psychotic features: Secondary | ICD-10-CM | POA: Diagnosis not present

## 2017-03-11 LAB — TB SKIN TEST
Induration: 0 mm
TB Skin Test: NEGATIVE

## 2017-04-06 ENCOUNTER — Encounter: Payer: Self-pay | Admitting: Internal Medicine

## 2017-06-06 ENCOUNTER — Encounter: Payer: Self-pay | Admitting: Adult Health
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
## 2017-06-08 ENCOUNTER — Ambulatory Visit: Payer: BLUE CROSS/BLUE SHIELD | Admitting: Adult Health

## 2017-06-08 ENCOUNTER — Encounter: Payer: Self-pay | Admitting: Adult Health

## 2017-06-08 VITALS — BP 104/66 | HR 82 | Temp 97.4°F | Ht 66.75 in | Wt 175.0 lb

## 2017-06-08 DIAGNOSIS — E559 Vitamin D deficiency, unspecified: Secondary | ICD-10-CM

## 2017-06-08 DIAGNOSIS — E8881 Metabolic syndrome: Secondary | ICD-10-CM

## 2017-06-08 DIAGNOSIS — Z79899 Other long term (current) drug therapy: Secondary | ICD-10-CM | POA: Diagnosis not present

## 2017-06-08 DIAGNOSIS — F3130 Bipolar disorder, current episode depressed, mild or moderate severity, unspecified: Secondary | ICD-10-CM

## 2017-06-08 DIAGNOSIS — Z1211 Encounter for screening for malignant neoplasm of colon: Secondary | ICD-10-CM

## 2017-06-08 DIAGNOSIS — E663 Overweight: Secondary | ICD-10-CM

## 2017-06-08 DIAGNOSIS — I10 Essential (primary) hypertension: Secondary | ICD-10-CM

## 2017-06-08 DIAGNOSIS — E782 Mixed hyperlipidemia: Secondary | ICD-10-CM | POA: Diagnosis not present

## 2017-06-08 NOTE — Patient Instructions (Signed)
Colonoscopy, Adult A colonoscopy is an exam to look at the entire large intestine. During the exam, a lubricated, bendable tube is inserted into the anus and then passed into the rectum, colon, and other parts of the large intestine. A colonoscopy is often done as a part of normal colorectal screening or in response to certain symptoms, such as anemia, persistent diarrhea, abdominal pain, and blood in the stool. The exam can help screen for and diagnose medical problems, including:  Tumors.  Polyps.  Inflammation.  Areas of bleeding.  Tell a health care provider about:  Any allergies you have.  All medicines you are taking, including vitamins, herbs, eye drops, creams, and over-the-counter medicines.  Any problems you or family members have had with anesthetic medicines.  Any blood disorders you have.  Any surgeries you have had.  Any medical conditions you have.  Any problems you have had passing stool. What are the risks? Generally, this is a safe procedure. However, problems may occur, including:  Bleeding.  A tear in the intestine.  A reaction to medicines given during the exam.  Infection (rare).  What happens before the procedure? Eating and drinking restrictions Follow instructions from your health care provider about eating and drinking, which may include:  A few days before the procedure - follow a low-fiber diet. Avoid nuts, seeds, dried fruit, raw fruits, and vegetables.  1-3 days before the procedure - follow a clear liquid diet. Drink only clear liquids, such as clear broth or bouillon, black coffee or tea, clear juice, clear soft drinks or sports drinks, gelatin dessert, and popsicles. Avoid any liquids that contain red or purple dye.  On the day of the procedure - do not eat or drink anything during the 2 hours before the procedure, or within the time period that your health care provider recommends.  Bowel prep If you were prescribed an oral bowel prep  to clean out your colon:  Take it as told by your health care provider. Starting the day before your procedure, you will need to drink a large amount of medicated liquid. The liquid will cause you to have multiple loose stools until your stool is almost clear or light green.  If your skin or anus gets irritated from diarrhea, you may use these to relieve the irritation: ? Medicated wipes, such as adult wet wipes with aloe and vitamin E. ? A skin soothing-product like petroleum jelly.  If you vomit while drinking the bowel prep, take a break for up to 60 minutes and then begin the bowel prep again. If vomiting continues and you cannot take the bowel prep without vomiting, call your health care provider.  General instructions  Ask your health care provider about changing or stopping your regular medicines. This is especially important if you are taking diabetes medicines or blood thinners.  Plan to have someone take you home from the hospital or clinic. What happens during the procedure?  An IV tube may be inserted into one of your veins.  You will be given medicine to help you relax (sedative).  To reduce your risk of infection: ? Your health care team will wash or sanitize their hands. ? Your anal area will be washed with soap.  You will be asked to lie on your side with your knees bent.  Your health care provider will lubricate a long, thin, flexible tube. The tube will have a camera and a light on the end.  The tube will be inserted into your   anus.  The tube will be gently eased through your rectum and colon.  Air will be delivered into your colon to keep it open. You may feel some pressure or cramping.  The camera will be used to take images during the procedure.  A small tissue sample may be removed from your body to be examined under a microscope (biopsy). If any potential problems are found, the tissue will be sent to a lab for testing.  If small polyps are found, your  health care provider may remove them and have them checked for cancer cells.  The tube that was inserted into your anus will be slowly removed. The procedure may vary among health care providers and hospitals. What happens after the procedure?  Your blood pressure, heart rate, breathing rate, and blood oxygen level will be monitored until the medicines you were given have worn off.  Do not drive for 24 hours after the exam.  You may have a small amount of blood in your stool.  You may pass gas and have mild abdominal cramping or bloating due to the air that was used to inflate your colon during the exam.  It is up to you to get the results of your procedure. Ask your health care provider, or the department performing the procedure, when your results will be ready. This information is not intended to replace advice given to you by your health care provider. Make sure you discuss any questions you have with your health care provider. Document Released: 01/16/2000 Document Revised: 11/19/2015 Document Reviewed: 04/01/2015 Elsevier Interactive Patient Education  2018 [employer]vier [employer]  

## 2017-06-08 NOTE — Addendum Note (Signed)
Addended by: Dan Maker on: 06/08/2017 09:35 AM   Modules accepted: Orders

## 2017-06-09 LAB — COMPLETE METABOLIC PANEL WITH GFR
AG Ratio: 1.9 (calc) (ref 1.0–2.5)
ALKALINE PHOSPHATASE (APISO): 56 U/L (ref 33–130)
ALT: 14 U/L (ref 6–29)
AST: 15 U/L (ref 10–35)
Albumin: 4.6 g/dL (ref 3.6–5.1)
BILIRUBIN TOTAL: 0.8 mg/dL (ref 0.2–1.2)
BUN: 24 mg/dL (ref 7–25)
CHLORIDE: 104 mmol/L (ref 98–110)
CO2: 30 mmol/L (ref 20–32)
Calcium: 10.8 mg/dL — ABNORMAL HIGH (ref 8.6–10.4)
Creat: 0.88 mg/dL (ref 0.50–1.05)
GFR, Est African American: 86 mL/min/{1.73_m2} (ref >=60)
GFR, Est Non African American: 74 mL/min/{1.73_m2} (ref >=60)
Globulin: 2.4 g/dL (calc) (ref 1.9–3.7)
Glucose, Bld: 94 mg/dL (ref 65–99)
POTASSIUM: 4.9 mmol/L (ref 3.5–5.3)
SODIUM: 140 mmol/L (ref 135–146)
Total Protein: 7 g/dL (ref 6.1–8.1)

## 2017-06-09 LAB — CBC WITH DIFFERENTIAL/PLATELET
BASOS ABS: 51 {cells}/uL (ref 0–200)
Basophils Relative: 1.1 %
EOS PCT: 6.3 %
Eosinophils Absolute: 290 cells/uL (ref 15–500)
HCT: 37.3 % (ref 35.0–45.0)
Hemoglobin: 12.6 g/dL (ref 11.7–15.5)
Lymphs Abs: 1472 cells/uL (ref 850–3900)
MCH: 29.1 pg (ref 27.0–33.0)
MCHC: 33.8 g/dL (ref 32.0–36.0)
MCV: 86.1 fL (ref 80.0–100.0)
MPV: 10.4 fL (ref 7.5–12.5)
Monocytes Relative: 6.3 %
NEUTROS PCT: 54.3 %
Neutro Abs: 2498 cells/uL (ref 1500–7800)
PLATELETS: 208 10*3/uL (ref 140–400)
RBC: 4.33 10*6/uL (ref 3.80–5.10)
RDW: 13.2 % (ref 11.0–15.0)
TOTAL LYMPHOCYTE: 32 %
WBC: 4.6 10*3/uL (ref 3.8–10.8)
WBCMIX: 290 {cells}/uL (ref 200–950)

## 2017-06-09 LAB — LIPID PANEL
CHOLESTEROL: 178 mg/dL (ref <200)
HDL: 46 mg/dL — AB (ref >50)
LDL Cholesterol (Calc): 103 mg/dL (calc) — ABNORMAL HIGH
NON-HDL CHOLESTEROL (CALC): 132 mg/dL — AB (ref <130)
TRIGLYCERIDES: 170 mg/dL — AB (ref <150)
Total CHOL/HDL Ratio: 3.9 (calc) (ref <5.0)

## 2017-06-09 LAB — TSH: TSH: 2.18 mIU/L

## 2017-06-09 LAB — LITHIUM LEVEL: Lithium Lvl: 1 mmol/L (ref 0.6–1.2)

## 2017-06-14 DIAGNOSIS — F314 Bipolar disorder, current episode depressed, severe, without psychotic features: Secondary | ICD-10-CM | POA: Diagnosis not present

## 2017-06-16 ENCOUNTER — Telehealth: Payer: Self-pay | Admitting: *Deleted

## 2017-06-16 NOTE — Telephone Encounter (Signed)
Patient called and reported she has heard ASA can to harmful to you, per the news. Per Dr Oneta Rack, he advised the patient to continue the ASA 81 mg because it can reduce your cancer risk dramatically. In regard to COQ10, Dr Oneta Rack advised it has not been proven to a any benefit. Patient is aware.

## 2017-07-19 ENCOUNTER — Ambulatory Visit: Payer: BLUE CROSS/BLUE SHIELD | Admitting: Obstetrics & Gynecology

## 2017-07-19 ENCOUNTER — Encounter: Payer: Self-pay | Admitting: Obstetrics & Gynecology

## 2017-07-19 VITALS — BP 124/82 | Ht 66.0 in | Wt 180.0 lb

## 2017-07-19 DIAGNOSIS — Z01419 Encounter for gynecological examination (general) (routine) without abnormal findings: Secondary | ICD-10-CM | POA: Diagnosis not present

## 2017-07-19 DIAGNOSIS — Z13820 Encounter for screening for osteoporosis: Secondary | ICD-10-CM

## 2017-07-19 DIAGNOSIS — Z780 Asymptomatic menopausal state: Secondary | ICD-10-CM

## 2017-07-19 DIAGNOSIS — K644 Residual hemorrhoidal skin tags: Secondary | ICD-10-CM

## 2017-07-19 DIAGNOSIS — Z1151 Encounter for screening for human papillomavirus (HPV): Secondary | ICD-10-CM | POA: Diagnosis not present

## 2017-07-19 NOTE — Progress Notes (Signed)
Tanya Tucker Coco Dec 06, 1960 [id number]   History:    57 y.o. G2P2L2 Married.  Husband had Esophageal Ca.  RP: Established patient presenting for annual gyn exam   HPI: Menopause, well on no HRT.  No postmenopausal bleeding.  No pelvic pain.  Normal vaginal secretions.  Not currently sexually active.  Urine and bowel movements normal.  But patient has been bothered by a large external hemorrhoid for many years.  Would like to have it removed.  Breasts normal.  Body mass index 29.05.  Health labs with family physician.  Past medical history,surgical history, family history and social history were all reviewed and documented in the EPIC chart.  Gynecologic History No LMP recorded. (Menstrual status: Perimenopausal). Contraception: post menopausal status Last Pap: 2018. Results were: Normal per patient Last mammogram: 12/2016. Results were: Negative Bone Density: Never Colonoscopy: Not done yet  Obstetric History OB History  Gravida Para Term Preterm AB Living  2 2       2   SAB TAB Ectopic Multiple Live Births               # Outcome Date GA Lbr Len/2nd Weight Sex Delivery Anes PTL Lv  2 Para           1 Para              ROS: A ROS was performed and pertinent positives and negatives are included in the history.  GENERAL: No fevers or chills. HEENT: No change in vision, no earache, sore throat or sinus congestion. NECK: No pain or stiffness. CARDIOVASCULAR: No chest pain or pressure. No palpitations. PULMONARY: No shortness of breath, cough or wheeze. GASTROINTESTINAL: No abdominal pain, nausea, vomiting or diarrhea, melena or bright red blood per rectum. GENITOURINARY: No urinary frequency, urgency, hesitancy or dysuria. MUSCULOSKELETAL: No joint or muscle pain, no back pain, no recent trauma. DERMATOLOGIC: No rash, no itching, no lesions. ENDOCRINE: No polyuria, polydipsia, no heat or cold intolerance. No recent change in weight. HEMATOLOGICAL: No anemia or easy bruising or  bleeding. NEUROLOGIC: No headache, seizures, numbness, tingling or weakness. PSYCHIATRIC: No depression, no loss of interest in normal activity or change in sleep pattern.     Exam:   BP 124/82   Ht 5\' 6"  (1.676 m)   Wt 180 lb (81.6 kg)   BMI 29.05 kg/m   Body mass index is 29.05 kg/m.  General appearance : Well developed well nourished female. No acute distress HEENT: Eyes: no retinal hemorrhage or exudates,  Neck supple, trachea midline, no carotid bruits, no thyroidmegaly Lungs: Clear to auscultation, no rhonchi or wheezes, or rib retractions  Heart: Regular rate and rhythm, no murmurs or gallops Breast:Examined in sitting and supine position were symmetrical in appearance, no palpable masses or tenderness,  no skin retraction, no nipple inversion, no nipple discharge, no skin discoloration, no axillary or supraclavicular lymphadenopathy Abdomen: no palpable masses or tenderness, no rebound or guarding Extremities: no edema or skin discoloration or tenderness  Pelvic: Vulva: Normal             Vagina: No gross lesions or discharge  Cervix: No gross lesions or discharge.  Pap/HR HPV done.    Uterus  AV, normal size, shape and consistency, non-tender and mobile  Adnexa  Without masses or tenderness  Anus: Large non-thrombosed external hemorrhoid   Assessment/Plan:  57 y.o. female for annual exam   1. Encounter for routine gynecological examination with Papanicolaou smear of cervix Normal gynecologic exam.  Pap with  high-risk HPV done today.  Breast exam normal.  Last screening mammogram was negative in November 2018.  Patient will organize a screening colonoscopy.  Health labs with family physician.  2. Menopause present Well on no HRT.  No PMB.  3. Screening for osteoporosis Recommend vitamin D supplements, calcium rich nutrition and regular weightbearing physical activity.  Will schedule bone density here now. - DG Bone Density; Future  4. External hemorrhoids Refer to  general surgeon.  Genia Del MD, 3:12 PM 07/19/2017

## 2017-07-20 ENCOUNTER — Encounter: Payer: Self-pay | Admitting: *Deleted

## 2017-07-20 ENCOUNTER — Telehealth: Payer: Self-pay | Admitting: *Deleted

## 2017-07-20 NOTE — Telephone Encounter (Signed)
Patient scheduled on 08/01/17 @ 10:00am  with Dr. Cliffton AstersWhite. Pt aware.

## 2017-07-20 NOTE — Telephone Encounter (Signed)
-----   Message from Genia DelMarie-Lyne Lavoie, MD sent at 07/19/2017  3:31 PM EDT ----- Regarding: Refer to general surgery Symptomatic external hemorrhoid for hemorrhoidectomy.  Please, not with Dr Romie LeveeAlicia Thomas per patient request.

## 2017-07-21 LAB — PAP, TP IMAGING W/ HPV RNA, RFLX HPV TYPE 16,18/45: HPV DNA High Risk: NOT DETECTED

## 2017-07-24 ENCOUNTER — Encounter: Payer: Self-pay | Admitting: Obstetrics & Gynecology

## 2017-07-24 NOTE — Patient Instructions (Signed)
1. Encounter for routine gynecological examination with Papanicolaou smear of cervix Normal gynecologic exam.  Pap with high-risk HPV done today.  Breast exam normal.  Last screening mammogram was negative in November 2018.  Patient will organize a screening colonoscopy.  Health labs with family physician.  2. Menopause present Well on no HRT.  No PMB.  3. Screening for osteoporosis Recommend vitamin D supplements, calcium rich nutrition and regular weightbearing physical activity.  Will schedule bone density here now. - DG Bone Density; Future  4. External hemorrhoids Refer to general surgeon.  Tanya Tucker, it was a pleasure seeing you today!  I will inform you of your results as soon as they are available.

## 2017-07-25 DIAGNOSIS — F314 Bipolar disorder, current episode depressed, severe, without psychotic features: Secondary | ICD-10-CM | POA: Diagnosis not present

## 2017-08-01 DIAGNOSIS — K644 Residual hemorrhoidal skin tags: Secondary | ICD-10-CM | POA: Diagnosis not present

## 2017-08-03 ENCOUNTER — Other Ambulatory Visit: Payer: Self-pay | Admitting: Gynecology

## 2017-08-03 DIAGNOSIS — Z13820 Encounter for screening for osteoporosis: Secondary | ICD-10-CM

## 2017-08-10 ENCOUNTER — Telehealth: Payer: Self-pay

## 2017-08-10 NOTE — Telephone Encounter (Signed)
Patient would like to know if she should start back on Vitamin D and a Multi-vitamin? Please advise

## 2017-08-11 NOTE — Telephone Encounter (Signed)
Left detailed message.   

## 2017-08-17 ENCOUNTER — Ambulatory Visit (INDEPENDENT_AMBULATORY_CARE_PROVIDER_SITE_OTHER): Payer: BLUE CROSS/BLUE SHIELD

## 2017-08-17 DIAGNOSIS — Z13820 Encounter for screening for osteoporosis: Secondary | ICD-10-CM

## 2017-08-18 ENCOUNTER — Encounter: Payer: Self-pay | Admitting: Gynecology

## 2017-08-30 ENCOUNTER — Encounter (INDEPENDENT_AMBULATORY_CARE_PROVIDER_SITE_OTHER): Payer: Self-pay

## 2017-09-12 ENCOUNTER — Ambulatory Visit: Payer: Self-pay | Admitting: Internal Medicine

## 2017-09-21 DIAGNOSIS — K635 Polyp of colon: Secondary | ICD-10-CM | POA: Diagnosis not present

## 2017-09-21 DIAGNOSIS — K640 First degree hemorrhoids: Secondary | ICD-10-CM | POA: Diagnosis not present

## 2017-09-21 DIAGNOSIS — Z1211 Encounter for screening for malignant neoplasm of colon: Secondary | ICD-10-CM | POA: Diagnosis not present

## 2017-09-21 LAB — HM COLONOSCOPY

## 2017-09-23 DIAGNOSIS — K635 Polyp of colon: Secondary | ICD-10-CM | POA: Diagnosis not present

## 2017-09-26 DIAGNOSIS — F314 Bipolar disorder, current episode depressed, severe, without psychotic features: Secondary | ICD-10-CM | POA: Diagnosis not present

## 2017-10-10 DIAGNOSIS — F314 Bipolar disorder, current episode depressed, severe, without psychotic features: Secondary | ICD-10-CM | POA: Diagnosis not present

## 2017-10-12 ENCOUNTER — Encounter: Payer: Self-pay | Admitting: Internal Medicine

## 2017-10-12 ENCOUNTER — Ambulatory Visit: Payer: BLUE CROSS/BLUE SHIELD | Admitting: Internal Medicine

## 2017-10-12 VITALS — BP 104/66 | HR 72 | Temp 97.3°F | Resp 16 | Ht 66.0 in | Wt 181.4 lb

## 2017-10-12 DIAGNOSIS — Z23 Encounter for immunization: Secondary | ICD-10-CM

## 2017-10-12 DIAGNOSIS — F319 Bipolar disorder, unspecified: Secondary | ICD-10-CM

## 2017-10-12 DIAGNOSIS — E559 Vitamin D deficiency, unspecified: Secondary | ICD-10-CM | POA: Diagnosis not present

## 2017-10-12 DIAGNOSIS — E8881 Metabolic syndrome: Secondary | ICD-10-CM

## 2017-10-12 DIAGNOSIS — Z8249 Family history of ischemic heart disease and other diseases of the circulatory system: Secondary | ICD-10-CM | POA: Insufficient documentation

## 2017-10-12 DIAGNOSIS — E782 Mixed hyperlipidemia: Secondary | ICD-10-CM | POA: Diagnosis not present

## 2017-10-12 DIAGNOSIS — I10 Essential (primary) hypertension: Secondary | ICD-10-CM

## 2017-10-12 DIAGNOSIS — Z79899 Other long term (current) drug therapy: Secondary | ICD-10-CM

## 2017-10-12 DIAGNOSIS — Z87891 Personal history of nicotine dependence: Secondary | ICD-10-CM | POA: Insufficient documentation

## 2017-10-12 NOTE — Patient Instructions (Signed)
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
## 2017-10-13 ENCOUNTER — Encounter: Payer: Self-pay | Admitting: Internal Medicine

## 2017-10-13 LAB — CBC WITH DIFFERENTIAL/PLATELET
BASOS PCT: 1.3 %
Basophils Absolute: 81 cells/uL (ref 0–200)
EOS ABS: 391 {cells}/uL (ref 15–500)
Eosinophils Relative: 6.3 %
HCT: 34.9 % — ABNORMAL LOW (ref 35.0–45.0)
HEMOGLOBIN: 11.8 g/dL (ref 11.7–15.5)
Lymphs Abs: 1854 cells/uL (ref 850–3900)
MCH: 29.2 pg (ref 27.0–33.0)
MCHC: 33.8 g/dL (ref 32.0–36.0)
MCV: 86.4 fL (ref 80.0–100.0)
MONOS PCT: 6 %
MPV: 10.6 fL (ref 7.5–12.5)
NEUTROS ABS: 3503 {cells}/uL (ref 1500–7800)
Neutrophils Relative %: 56.5 %
Platelets: 197 10*3/uL (ref 140–400)
RBC: 4.04 10*6/uL (ref 3.80–5.10)
RDW: 13.2 % (ref 11.0–15.0)
Total Lymphocyte: 29.9 %
WBC mixed population: 372 cells/uL (ref 200–950)
WBC: 6.2 10*3/uL (ref 3.8–10.8)

## 2017-10-13 LAB — LIPID PANEL
CHOL/HDL RATIO: 3.6 (calc) (ref <5.0)
Cholesterol: 145 mg/dL (ref <200)
HDL: 40 mg/dL — ABNORMAL LOW (ref >50)
LDL Cholesterol (Calc): 69 mg/dL (calc)
NON-HDL CHOLESTEROL (CALC): 105 mg/dL (ref <130)
TRIGLYCERIDES: 277 mg/dL — AB (ref <150)

## 2017-10-13 LAB — COMPLETE METABOLIC PANEL WITH GFR
AG Ratio: 2.1 (calc) (ref 1.0–2.5)
ALBUMIN MSPROF: 4.6 g/dL (ref 3.6–5.1)
ALT: 18 U/L (ref 6–29)
AST: 18 U/L (ref 10–35)
Alkaline phosphatase (APISO): 50 U/L (ref 33–130)
BILIRUBIN TOTAL: 0.6 mg/dL (ref 0.2–1.2)
BUN / CREAT RATIO: 23 (calc) — AB (ref 6–22)
BUN: 26 mg/dL — AB (ref 7–25)
CO2: 28 mmol/L (ref 20–32)
Calcium: 10.8 mg/dL — ABNORMAL HIGH (ref 8.6–10.4)
Chloride: 103 mmol/L (ref 98–110)
Creat: 1.13 mg/dL — ABNORMAL HIGH (ref 0.50–1.05)
GFR, EST AFRICAN AMERICAN: 64 mL/min/{1.73_m2} (ref >=60)
GFR, EST NON AFRICAN AMERICAN: 55 mL/min/{1.73_m2} — AB (ref >=60)
GLUCOSE: 102 mg/dL — AB (ref 65–99)
Globulin: 2.2 g/dL (calc) (ref 1.9–3.7)
Potassium: 4.7 mmol/L (ref 3.5–5.3)
Sodium: 139 mmol/L (ref 135–146)
TOTAL PROTEIN: 6.8 g/dL (ref 6.1–8.1)

## 2017-10-13 LAB — LITHIUM LEVEL: LITHIUM LVL: 0.9 mmol/L (ref 0.6–1.2)

## 2017-10-13 LAB — HEMOGLOBIN A1C
HEMOGLOBIN A1C: 5.1 %{Hb} (ref <5.7)
Mean Plasma Glucose: 100 (calc)
eAG (mmol/L): 5.5 (calc)

## 2017-10-13 LAB — MAGNESIUM: MAGNESIUM: 2.1 mg/dL (ref 1.5–2.5)

## 2017-10-13 LAB — TSH: TSH: 2.24 m[IU]/L

## 2017-10-13 LAB — VITAMIN D 25 HYDROXY (VIT D DEFICIENCY, FRACTURES): Vit D, 25-Hydroxy: 58 ng/mL (ref 30–100)

## 2017-10-13 LAB — INSULIN, RANDOM: Insulin: 36.5 u[IU]/mL — ABNORMAL HIGH (ref 2.0–19.6)

## 2017-10-14 ENCOUNTER — Encounter: Payer: Self-pay | Admitting: *Deleted

## 2017-10-19 ENCOUNTER — Encounter: Payer: Self-pay | Admitting: *Deleted

## 2017-11-08 ENCOUNTER — Ambulatory Visit: Payer: BLUE CROSS/BLUE SHIELD | Admitting: Mental Health

## 2017-11-12 ENCOUNTER — Other Ambulatory Visit: Payer: Self-pay | Admitting: Physician Assistant
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
## 2018-01-12 ENCOUNTER — Encounter: Payer: Self-pay | Admitting: Physician Assistant

## 2018-01-12 ENCOUNTER — Ambulatory Visit: Payer: BLUE CROSS/BLUE SHIELD | Admitting: Physician Assistant

## 2018-01-12 VITALS — BP 122/68 | HR 61 | Temp 97.9°F | Ht 66.0 in | Wt 181.8 lb

## 2018-01-12 DIAGNOSIS — E8881 Metabolic syndrome: Secondary | ICD-10-CM

## 2018-01-12 DIAGNOSIS — E559 Vitamin D deficiency, unspecified: Secondary | ICD-10-CM

## 2018-01-12 DIAGNOSIS — I10 Essential (primary) hypertension: Secondary | ICD-10-CM

## 2018-01-12 DIAGNOSIS — F3163 Bipolar disorder, current episode mixed, severe, without psychotic features: Secondary | ICD-10-CM

## 2018-01-12 DIAGNOSIS — E663 Overweight: Secondary | ICD-10-CM

## 2018-01-12 DIAGNOSIS — E782 Mixed hyperlipidemia: Secondary | ICD-10-CM | POA: Diagnosis not present

## 2018-01-12 DIAGNOSIS — Z87891 Personal history of nicotine dependence: Secondary | ICD-10-CM

## 2018-01-12 DIAGNOSIS — E88819 Insulin resistance, unspecified: Secondary | ICD-10-CM

## 2018-01-12 DIAGNOSIS — Z79899 Other long term (current) drug therapy: Secondary | ICD-10-CM

## 2018-01-12 NOTE — Patient Instructions (Signed)
ALLERGY MEDICATIONS OVER THE COUNTER  Please pick one of the over the counter allergy medications below and take it once daily for allergies.  Claritin or loratadine cheapest but likely the weakest  Zyrtec or certizine at night because it can make you sleepy The strongest is allegra or fexafinadine  Cheapest at [employer], [employer], [employer]   Food Poisoning Food poisoning is an illness that is caused by eating or drinking contaminated foods or drinks. In most cases, food poisoning is mild and lasts 1-2 days. However, some cases can be serious, especially for people who have weak body defense (immune) systems, older people, children and infants, and pregnant women. What are the causes? Foods can become contaminated with viruses, bacteria, parasites, mold, or chemicals as a result of:  Poor personal hygiene, such as poor hand washing practices.  Storing food improperly, such as not refrigerating raw meat.  Using unclean surfaces for serving, preparing, and storing food.  Cooking or eating with unclean utensils.  If contaminated food is eaten, viruses, bacteria, or parasites can harm the intestine. This often causes severe diarrhea. The most common causes of food poisoning include:  Viruses, such as: ? Norovirus. ? Rotavirus.  Bacteria, such as: ? Salmonella. ? Listeria. ? E. coli (Escherichia coli).  Parasites, such as: ? Giardia. ? Toxoplasmosis.  What are the signs or symptoms? Symptoms may take several hours to appear after you consume contaminated food or drink. Symptoms include:  Nausea.  Vomiting.  Cramping.  Diarrhea.  Fever and chills.  Muscle aches.  Dehydration. Dehydration can cause you to be tired and thirsty, have a dry mouth, and urinate less frequently.  How is this diagnosed? Your health care provider can diagnose food poisoning with a medical history and physical exam. This will include asking you what you have recently eaten. You may also have tests,  including:  Blood tests.  Stool tests.  How is this treated? Treatment focuses on relieving your symptoms and making sure that you are hydrated. You may also be given medicines. In severe cases, hospitalization may be required and you may need to receive fluids through an IV tube. Follow these instructions at home: Eating and drinking   Drink enough fluids to keep your urine clear or pale yellow. You may need to drink small amounts of clear liquids frequently.  Avoid milk, caffeine, and alcohol.  Ask your health care provider for specific rehydration instructions.  Eat small, frequent meals rather than large meals. Medicines  Take over-the-counter and prescription medicines only as told by your health care provider. Ask your health care provider if you should continue to take any of your regular prescribed and over-the-counter medicines.  If you were prescribed an antibiotic medicine, take it as told by your health care provider. Do not stop taking the antibiotic even if you start to feel better. General instructions  Wash your hands thoroughly before you prepare food and after you go to the bathroom (use the toilet). Make sure people who live with you also wash their hands often.  Clean surfaces that you touch with a product that contains chlorine bleach.  Keep all follow-up visits as told by your health care provider. This is important. How is this prevented?  Wash your hands, food preparation surfaces, and utensils thoroughly before and after you handle raw foods.  Use separate food preparation surfaces and storage spaces for raw meat and for fruits and vegetables.  Keep refrigerated foods colder than 42F (5C).  Serve hot foods immediately or  keep them heated above 140F (60C).  Store dry foods in cool, dry spaces away from excess heat or moisture. Throw out any foods that do not smell right or are in cans that are bulging.  Follow approved canning procedures.  Heat  canned foods thoroughly before you taste them.  Drink bottled or sterile water when you travel. Get help right away if: Call 911 or go to the emergency room if:  You have difficulty breathing, swallowing, talking, or moving.  You develop blurred vision.  You cannot eat or drink without vomiting.  You faint.  Your eyes turn yellow.  Your vomiting or diarrhea is persistent.  Abdominal pain develops, increases, or localizes in one small area.  You have a fever.  You have blood or mucus in your stools, or your stools look dark black and tarry.  You have signs of dehydration, such as: ? Dark urine, very little urine, or no urine. ? Cracked lips. ? Not making tears while crying. ? Dry mouth. ? Sunken eyes. ? Sleepiness. ? Weakness. ? Dizziness.  This information is not intended to replace advice given to you by your health care provider. Make sure you discuss any questions you have with your health care provider. Document Released: 10/17/2003 Document Revised: 06/17/2015 Document Reviewed: 07/22/2014 Elsevier Interactive Patient Education  [employer].

## 2018-01-13 LAB — CBC WITH DIFFERENTIAL/PLATELET
BASOS ABS: 32 {cells}/uL (ref 0–200)
Basophils Relative: 0.6 %
EOS ABS: 248 {cells}/uL (ref 15–500)
Eosinophils Relative: 4.6 %
HCT: 34.8 % — ABNORMAL LOW (ref 35.0–45.0)
HEMOGLOBIN: 11.5 g/dL — AB (ref 11.7–15.5)
Lymphs Abs: 1485 cells/uL (ref 850–3900)
MCH: 28.3 pg (ref 27.0–33.0)
MCHC: 33 g/dL (ref 32.0–36.0)
MCV: 85.7 fL (ref 80.0–100.0)
MONOS PCT: 7.4 %
MPV: 10.6 fL (ref 7.5–12.5)
Neutro Abs: 3235 cells/uL (ref 1500–7800)
Neutrophils Relative %: 59.9 %
Platelets: 192 10*3/uL (ref 140–400)
RBC: 4.06 10*6/uL (ref 3.80–5.10)
RDW: 13.1 % (ref 11.0–15.0)
TOTAL LYMPHOCYTE: 27.5 %
WBC mixed population: 400 cells/uL (ref 200–950)
WBC: 5.4 10*3/uL (ref 3.8–10.8)

## 2018-01-13 LAB — TEST AUTHORIZATION

## 2018-01-13 LAB — COMPLETE METABOLIC PANEL WITH GFR
AG RATIO: 1.9 (calc) (ref 1.0–2.5)
ALT: 18 U/L (ref 6–29)
AST: 17 U/L (ref 10–35)
Albumin: 4.2 g/dL (ref 3.6–5.1)
Alkaline phosphatase (APISO): 46 U/L (ref 33–130)
BUN: 19 mg/dL (ref 7–25)
CALCIUM: 9.8 mg/dL (ref 8.6–10.4)
CO2: 26 mmol/L (ref 20–32)
CREATININE: 0.78 mg/dL (ref 0.50–1.05)
Chloride: 104 mmol/L (ref 98–110)
GFR, EST AFRICAN AMERICAN: 99 mL/min/{1.73_m2} (ref >=60)
GFR, EST NON AFRICAN AMERICAN: 86 mL/min/{1.73_m2} (ref >=60)
Globulin: 2.2 g/dL (calc) (ref 1.9–3.7)
Glucose, Bld: 100 mg/dL — ABNORMAL HIGH (ref 65–99)
POTASSIUM: 3.9 mmol/L (ref 3.5–5.3)
Sodium: 139 mmol/L (ref 135–146)
TOTAL PROTEIN: 6.4 g/dL (ref 6.1–8.1)
Total Bilirubin: 0.7 mg/dL (ref 0.2–1.2)

## 2018-01-13 LAB — LIPID PANEL
CHOL/HDL RATIO: 2.6 (calc) (ref <5.0)
Cholesterol: 102 mg/dL (ref <200)
HDL: 39 mg/dL — ABNORMAL LOW (ref >50)
LDL CHOLESTEROL (CALC): 45 mg/dL
NON-HDL CHOLESTEROL (CALC): 63 mg/dL (ref <130)
TRIGLYCERIDES: 96 mg/dL (ref <150)

## 2018-01-13 LAB — MAGNESIUM: Magnesium: 2 mg/dL (ref 1.5–2.5)

## 2018-01-13 LAB — TSH: TSH: 1.54 mIU/L

## 2018-01-13 LAB — VITAMIN B12: Vitamin B-12: 1352 pg/mL — ABNORMAL HIGH (ref 200–1100)

## 2018-01-13 LAB — IRON,TIBC AND FERRITIN PANEL
%SAT: 16 % (calc) (ref 16–45)
Ferritin: 120 ng/mL (ref 16–232)
Iron: 52 ug/dL (ref 45–160)
TIBC: 324 mcg/dL (calc) (ref 250–450)

## 2018-02-13 ENCOUNTER — Encounter: Payer: Self-pay | Admitting: Emergency Medicine

## 2018-02-13 DIAGNOSIS — F314 Bipolar disorder, current episode depressed, severe, without psychotic features: Secondary | ICD-10-CM

## 2018-02-13 DIAGNOSIS — G4700 Insomnia, unspecified: Secondary | ICD-10-CM

## 2018-03-01 ENCOUNTER — Other Ambulatory Visit: Payer: Self-pay

## 2018-03-01 ENCOUNTER — Telehealth: Payer: Self-pay | Admitting: Physician Assistant

## 2018-03-01 MED ORDER — QUETIAPINE FUMARATE 50 MG PO TABS: 50.0000 mg | ORAL_TABLET | Freq: Every day | ORAL | 0 refills | Status: DC

## 2018-03-01 NOTE — Telephone Encounter (Signed)
Submitted to her pharmacy

## 2018-03-01 NOTE — Telephone Encounter (Signed)
Pt wants to know if we can send a refill in for Seroquel to [facility] on [address]  She stated that she has lost her bottle of medicine, that her mother passed away and things were chaotic this past month. If ok please send to [facility], on [address].

## 2018-03-02 ENCOUNTER — Other Ambulatory Visit: Payer: Self-pay

## 2018-03-02 MED ORDER — QUETIAPINE FUMARATE 50 MG PO TABS: ORAL_TABLET | ORAL | 0 refills | Status: DC

## 2018-03-14 ENCOUNTER — Other Ambulatory Visit: Payer: Self-pay | Admitting: Internal Medicine

## 2018-03-27 ENCOUNTER — Encounter: Payer: Self-pay | Admitting: Physician Assistant

## 2018-03-27 ENCOUNTER — Ambulatory Visit: Payer: BLUE CROSS/BLUE SHIELD | Admitting: Physician Assistant

## 2018-03-27 ENCOUNTER — Telehealth: Payer: Self-pay

## 2018-03-27 DIAGNOSIS — G4700 Insomnia, unspecified: Secondary | ICD-10-CM | POA: Diagnosis not present

## 2018-03-27 DIAGNOSIS — F319 Bipolar disorder, unspecified: Secondary | ICD-10-CM

## 2018-03-27 MED ORDER — LITHIUM CARBONATE ER 300 MG PO TBCR: EXTENDED_RELEASE_TABLET | ORAL | 1 refills | Status: DC

## 2018-03-27 NOTE — Progress Notes (Signed)
[facility] Med Check  Patient ID: Tanya Tucker,  MRN: [id number]  PCP: Lucky Cowboy, MD  Date of Evaluation: 03/27/2018 Time spent:15 minutes  Chief Complaint:  Chief Complaint    Follow-up      HISTORY/CURRENT STATUS: HPI here for 31-month med check.  Patient's mom passed away Feb 13, 2022.  She misses her a lot and of course is very sad but other than that, she feels that her meds are working.    Patient denies loss of interest in usual activities and is able to enjoy things.  Denies decreased energy or motivation.  Appetite has not changed.  No extreme sadness, tearfulness, or feelings of hopelessness.  Denies any changes in concentration, making decisions or remembering things.  Denies suicidal or homicidal thoughts.  Patient denies increased energy with decreased need for sleep, no increased talkativeness, no racing thoughts, no impulsivity or risky behaviors, no increased spending, no increased libido, no grandiosity.  She is sleeping really well.  Has not even been taking the Seroquel for the past couple of weeks and has slept fine without it.  Denies muscle or joint pain, stiffness, or dystonia.  Denies dizziness, syncope, seizures, numbness, tingling, tremor, tics, unsteady gait, slurred speech, confusion.   Individual Medical History/ Review of Systems: Changes? :No    Past medications for mental health diagnoses include: Lithium, Seroquel  Allergies: Prednisolone  Current Medications:  Current Outpatient Medications:  .  BABY ASPIRIN PO, Take 81 mg by mouth 2 (two) times daily. , Disp: , Rfl:  .  calcium carbonate (TUMS - DOSED IN MG ELEMENTAL CALCIUM) 500 MG chewable tablet, Chew 1 tablet by mouth daily., Disp: , Rfl:  .  Cholecalciferol (VITAMIN D PO), Take 10,000 Units by mouth daily., Disp: , Rfl:  .  co-enzyme Q-10 30 MG capsule, Take 30 mg by mouth daily. , Disp: , Rfl:  .  lisinopril-hydrochlorothiazide (PRINZIDE,ZESTORETIC) 10-12.5 MG tablet, TAKE  1 TABLET BY MOUTH ONCE DAILY, Disp: 90 tablet, Rfl: 0 .  Magnesium 250 MG TABS, Take 250 mg by mouth 2 (two) times daily., Disp: , Rfl:  .  Multiple Vitamin (MULTIVITAMIN WITH MINERALS) TABS tablet, Take 1 tablet by mouth daily., Disp: , Rfl:  .  QUEtiapine (SEROQUEL) 50 MG tablet, Takes 1/2-1 tablet by mouth at bedtime, Disp: 90 tablet, Rfl: 0 .  rosuvastatin (CRESTOR) 40 MG tablet, Take 1/2 to 1 tablet daily or as directed for Cholesterol, Disp: 30 tablet, Rfl: 5 .  lithium carbonate (LITHOBID) 300 MG CR tablet, 1 pill q am, 2 q hs., Disp: 270 tablet, Rfl: 1 Medication Side Effects: none  Family Medical/ Social History: Changes? Yes Mom died 2022-02-13.   MENTAL HEALTH EXAM:  There were no vitals taken for this visit.There is no height or weight on file to calculate BMI.  General Appearance: Casual and Well Groomed  Eye Contact:  Good  Speech:  Clear and Coherent  Volume:  Normal  Mood:  Euthymic  Affect:  Appropriate  Thought Process:  Goal Directed  Orientation:  Full (Time, Place, and Person)  Thought Content: Logical   Suicidal Thoughts:  No  Homicidal Thoughts:  No  Memory:  WNL  Judgement:  Good  Insight:  Good  Psychomotor Activity:  Normal  Concentration:  Concentration: Good  Recall:  Good  Fund of Knowledge: Good  Language: Good  Assets:  Desire for Improvement  ADL's:  Intact  Cognition: WNL  Prognosis:  Good  Labs from PCP 01/12/2018 CBC is within normal limits  except hemoglobin 11.5 hematocrit 34.8.  Glucose 100, BUN 19 creatinine 0.78.  Cholesterol panel was within normal limits, TSH was 1.54.  Lithium level was not done.  DIAGNOSES:    ICD-10-CM   1. Bipolar I disorder (HCC) F31.9   2. Insomnia, unspecified type G47.00     Receiving Psychotherapy: Yes With Ulice Bold, LPC.   RECOMMENDATIONS: Continue Lithobid 300 mg 1 every morning and 2 nightly. Continue Seroquel 25 to 50 mg p.o. nightly as needed. Lithium level was ordered.  Handwritten order was  given to patient. Continue psychotherapy with Ulice Bold, LPC. Return in 6 months or sooner as needed.   Melony Overly, PA-C

## 2018-03-27 NOTE — Telephone Encounter (Signed)
Request for most recent labs be faxed to [facility], Attn. Melony Overly.   Faxed results.

## 2018-03-30 ENCOUNTER — Other Ambulatory Visit: Payer: Self-pay | Admitting: Internal Medicine

## 2018-03-30 DIAGNOSIS — E782 Mixed hyperlipidemia: Secondary | ICD-10-CM

## 2018-04-03 ENCOUNTER — Ambulatory Visit: Payer: BLUE CROSS/BLUE SHIELD | Admitting: Mental Health

## 2018-04-04 ENCOUNTER — Encounter: Payer: Self-pay | Admitting: Internal Medicine

## 2018-04-21 ENCOUNTER — Encounter: Payer: Self-pay | Admitting: Internal Medicine

## 2018-05-19 ENCOUNTER — Ambulatory Visit (INDEPENDENT_AMBULATORY_CARE_PROVIDER_SITE_OTHER): Payer: BLUE CROSS/BLUE SHIELD | Admitting: Mental Health

## 2018-05-19 ENCOUNTER — Other Ambulatory Visit: Payer: Self-pay

## 2018-05-19 ENCOUNTER — Encounter: Payer: Self-pay | Admitting: Mental Health

## 2018-05-19 DIAGNOSIS — F319 Bipolar disorder, unspecified: Secondary | ICD-10-CM

## 2018-05-19 NOTE — Progress Notes (Signed)
[facility] Counselor/Therapist Progress Note  Patient ID: Annia Soloff, MRN: [id number],    Date: 05/19/2018  Time Spent: 15 minutes  Treatment Type: Individual Therapy  Reported Symptoms:   Mental Status Exam:  Appearance:   unseen     Behavior:  Appropriate, Sharing and Agitated  Motor:  Restlestness as reported  Speech/Language:   Pressured  Affect:  anxious  Mood:  anxious, irritable, labile and sad  Thought process:  goal directed  Thought content:    WNL and Rumination  Sensory/Perceptual disturbances:    WNL  Orientation:  oriented to person, place and time/date  Attention:  Good  Concentration:  Good  Memory:  WNL  Fund of knowledge:   Good  Insight:    Good  Judgment:   Good  Impulse Control:  Good   Risk Assessment: Danger to Self:  No Self-injurious Behavior: No Danger to Others: No Duty to Warn:no Physical Aggression / Violence:No  Access to Firearms a concern: No  Gang Involvement:No   Subjective: Dealing with death of mother and the estate. Having some depression and anxiety.  Mother had been in declining health and died of cardiac arrest. Mother had dementia. Latecia is concerned that under the circumstances of the pandemic they would have to sell the house for too low a price. Sister is living there now. Dennie Bible and Lelon Mast have moved in together now. Relationship with Trinda Pascal, daughter, has been more attentive and nurturing since Darius's mother died. Anxious and stressed about Corona  Virus Pandemic.  Interventions: Solution-Oriented/Positive Psychology, supportiive, cognitive behavioral  Diagnosis:   ICD-10-CM   1. Bipolar I disorder [facility]) F31.9       Treatment Plan   Patient Name:  Jaedyn Leight   Date: May 19, 2018   Didactic topic to be discussed:           Anxiety:                   Locus of control                              Work/Life balance           Depression                             Problem-solving                               Relationships                                   Boundaries                                     Coping srategies                             Communication                    Recovery from trauma                    Self-care  Validation  Other     Goals:  Patient  1. Maintains mood stabiity:  decreased symptoms of     depression     anxiety  2.   Practices pro-active self-care:   restful sleep, nutrition, exercise, socialization  3.   Effective utilizes boundaries and sets limits  4.   Utliizes coping strategies and problem solving techniques for stress management  5.   Feels accurately heard, understood and validated  Other: Bereavement recovery      Tenny CrawCarson M. Eliese Kerwood [facility]     Ulice Boldarson Blakleigh Straw, [facility]

## 2018-07-07 ENCOUNTER — Encounter: Payer: Self-pay | Admitting: Internal Medicine

## 2018-07-15 ENCOUNTER — Other Ambulatory Visit: Payer: Self-pay | Admitting: Internal Medicine

## 2018-07-15 DIAGNOSIS — I10 Essential (primary) hypertension: Secondary | ICD-10-CM

## 2018-07-15 MED ORDER — LISINOPRIL-HYDROCHLOROTHIAZIDE 10-12.5 MG PO TABS: ORAL_TABLET | ORAL | 1 refills | Status: DC

## 2018-07-17 ENCOUNTER — Other Ambulatory Visit: Payer: Self-pay | Admitting: Internal Medicine

## 2018-09-25 ENCOUNTER — Other Ambulatory Visit: Payer: Self-pay

## 2018-09-25 ENCOUNTER — Encounter: Payer: Self-pay | Admitting: Physician Assistant

## 2018-09-25 ENCOUNTER — Ambulatory Visit (INDEPENDENT_AMBULATORY_CARE_PROVIDER_SITE_OTHER): Payer: BLUE CROSS/BLUE SHIELD | Admitting: Physician Assistant

## 2018-09-25 DIAGNOSIS — Z79899 Other long term (current) drug therapy: Secondary | ICD-10-CM | POA: Diagnosis not present

## 2018-09-25 DIAGNOSIS — F319 Bipolar disorder, unspecified: Secondary | ICD-10-CM | POA: Diagnosis not present

## 2018-09-25 NOTE — Progress Notes (Signed)
[facility] Med Check  Patient ID: Tanya Tucker,  MRN: [id number]  PCP: Lucky CowboyMcKeown, William, MD  Date of Evaluation: 09/25/2018 Time spent:15 minutes  Chief Complaint:  Chief Complaint    Follow-up     Virtual Visit via Telephone Note  I connected with patient by a video enabled telemedicine application or telephone, with their informed consent, and verified patient privacy and that I am speaking with the correct person using two identifiers.  I am private, in my home and the patient is at work.   I discussed the limitations, risks, security and privacy concerns of performing an evaluation and management service by telephone and the availability of in person appointments. I also discussed with the patient that there may be a patient responsible charge related to this service. The patient expressed understanding and agreed to proceed.   I discussed the assessment and treatment plan with the patient. The patient was provided an opportunity to ask questions and all were answered. The patient agreed with the plan and demonstrated an understanding of the instructions.   The patient was advised to call back or seek an in-person evaluation if the symptoms worsen or if the condition fails to improve as anticipated.  I provided 15 minutes of non-face-to-face time during this encounter.  HISTORY/CURRENT STATUS: HPI  For routine med check.   "I'm doing really well.  My family is fine.  We're all working and even though we're trying to be very careful not to get covid, we get to see each other. My husband is fine.  His recent scans showed no cancer and we're very thankful for that!"  Patient denies loss of interest in usual activities and is able to enjoy things.  Denies decreased energy or motivation.  Appetite has not changed.  No extreme sadness, tearfulness, or feelings of hopelessness.  Denies any changes in concentration, making decisions or remembering things.  She sleeps well.  No  longer taking Seroquel because she does not need it.  Denies suicidal or homicidal thoughts.  Patient denies increased energy with decreased need for sleep, no increased talkativeness, no racing thoughts, no impulsivity or risky behaviors, no increased spending, no increased libido, no grandiosity.  Denies dizziness, syncope, seizures, numbness, tingling, tremor, tics, unsteady gait, slurred speech, confusion. Denies muscle or joint pain, stiffness, or dystonia.  Individual Medical History/ Review of Systems: Changes? :No    Past medications for mental health diagnoses include: Lithium, Seroquel  Allergies: Prednisolone  Current Medications:  Current Outpatient Medications:  .  BABY ASPIRIN PO, Take 81 mg by mouth 2 (two) times daily. , Disp: , Rfl:  .  calcium carbonate (TUMS - DOSED IN MG ELEMENTAL CALCIUM) 500 MG chewable tablet, Chew 1 tablet by mouth daily., Disp: , Rfl:  .  Cholecalciferol (VITAMIN D PO), Take 10,000 Units by mouth daily., Disp: , Rfl:  .  co-enzyme Q-10 30 MG capsule, Take 30 mg by mouth daily. , Disp: , Rfl:  .  lisinopril-hydrochlorothiazide (ZESTORETIC) 10-12.5 MG tablet, Take 1 tablet Daily for BP, Disp: 90 tablet, Rfl: 1 .  lithium carbonate (LITHOBID) 300 MG CR tablet, 1 pill q am, 2 q hs., Disp: 270 tablet, Rfl: 1 .  Magnesium 250 MG TABS, Take 250 mg by mouth 2 (two) times daily., Disp: , Rfl:  .  Multiple Vitamin (MULTIVITAMIN WITH MINERALS) TABS tablet, Take 1 tablet by mouth daily., Disp: , Rfl:  .  rosuvastatin (CRESTOR) 40 MG tablet, TAKE 1/2 TO 1  TABLET  DAILY OR  AS DIRECTED FOR CHOLESTEROL, Disp: 90 tablet, Rfl: 1 Medication Side Effects: none  Family Medical/ Social History: Changes? Yes working from home a lot due to the coronavirus.  MENTAL HEALTH EXAM:  There were no vitals taken for this visit.There is no height or weight on file to calculate BMI.  General Appearance: unable to assess  Eye Contact:  unable to assess  Speech:  Clear and  Coherent  Volume:  Normal  Mood:  Euthymic  Affect:  unable to assess  Thought Process:  Goal Directed  Orientation:  Full (Time, Place, and Person)  Thought Content: Logical   Suicidal Thoughts:  No  Homicidal Thoughts:  No  Memory:  WNL  Judgement:  Good  Insight:  Good  Psychomotor Activity:  unable to assess  Concentration:  Concentration: Good  Recall:  Good  Fund of Knowledge: Good  Language: Good  Assets:  Desire for Improvement  ADL's:  Intact  Cognition: WNL  Prognosis:  Good    DIAGNOSES:    ICD-10-CM   1. Bipolar I disorder ([place])  F31.9   2. Encounter for long-term (current) use of medications  Z79.899 TSH    Lithium level    Basic metabolic panel    Receiving Psychotherapy: Yes With Rosary Lively, [facility]   RECOMMENDATIONS:  Continue Lithobid 300 mg 1 p.o. every morning and 2 p.o. nightly. Continue multivitamin, calcium, vitamin D, co-Q10, fish oil daily. Continue psychotherapy with Rosary Lively, [place] ordered as above.  She will have her PCP draw them next month at her annual exam. Return in 6 months  Donnal Moat, PA-C   This record has been created using [employer].  Chart creation errors have been sought, but may not always have been located and corrected. Such creation errors do not reflect on the standard of medical care.

## 2018-10-22 ENCOUNTER — Encounter: Payer: Self-pay | Admitting: Internal Medicine

## 2018-10-22 NOTE — Patient Instructions (Signed)
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
## 2018-10-23 ENCOUNTER — Encounter: Payer: Self-pay | Admitting: Internal Medicine

## 2018-10-23 ENCOUNTER — Ambulatory Visit: Payer: BC Managed Care – PPO | Admitting: Internal Medicine

## 2018-10-23 ENCOUNTER — Other Ambulatory Visit: Payer: Self-pay

## 2018-10-23 VITALS — BP 126/84 | HR 68 | Temp 97.5°F | Ht 67.5 in | Wt 181.4 lb

## 2018-10-23 DIAGNOSIS — Z1329 Encounter for screening for other suspected endocrine disorder: Secondary | ICD-10-CM

## 2018-10-23 DIAGNOSIS — Z8249 Family history of ischemic heart disease and other diseases of the circulatory system: Secondary | ICD-10-CM

## 2018-10-23 DIAGNOSIS — Z1389 Encounter for screening for other disorder: Secondary | ICD-10-CM | POA: Diagnosis not present

## 2018-10-23 DIAGNOSIS — Z87891 Personal history of nicotine dependence: Secondary | ICD-10-CM | POA: Diagnosis not present

## 2018-10-23 DIAGNOSIS — Z0001 Encounter for general adult medical examination with abnormal findings: Secondary | ICD-10-CM

## 2018-10-23 DIAGNOSIS — Z111 Encounter for screening for respiratory tuberculosis: Secondary | ICD-10-CM | POA: Diagnosis not present

## 2018-10-23 DIAGNOSIS — E8881 Metabolic syndrome: Secondary | ICD-10-CM

## 2018-10-23 DIAGNOSIS — Z136 Encounter for screening for cardiovascular disorders: Secondary | ICD-10-CM | POA: Diagnosis not present

## 2018-10-23 DIAGNOSIS — Z0000 Encounter for general adult medical examination without abnormal findings: Secondary | ICD-10-CM

## 2018-10-23 DIAGNOSIS — I10 Essential (primary) hypertension: Secondary | ICD-10-CM | POA: Diagnosis not present

## 2018-10-23 DIAGNOSIS — F319 Bipolar disorder, unspecified: Secondary | ICD-10-CM

## 2018-10-23 DIAGNOSIS — Z13220 Encounter for screening for lipoid disorders: Secondary | ICD-10-CM

## 2018-10-23 DIAGNOSIS — E782 Mixed hyperlipidemia: Secondary | ICD-10-CM

## 2018-10-23 DIAGNOSIS — Z131 Encounter for screening for diabetes mellitus: Secondary | ICD-10-CM

## 2018-10-23 DIAGNOSIS — E559 Vitamin D deficiency, unspecified: Secondary | ICD-10-CM

## 2018-10-23 DIAGNOSIS — Z1211 Encounter for screening for malignant neoplasm of colon: Secondary | ICD-10-CM

## 2018-10-23 DIAGNOSIS — Z79899 Other long term (current) drug therapy: Secondary | ICD-10-CM | POA: Diagnosis not present

## 2018-10-23 DIAGNOSIS — Z130 Encounter for screening for diseases of the blood and blood-forming organs and certain disorders involving the immune mechanism: Secondary | ICD-10-CM

## 2018-10-23 DIAGNOSIS — R7309 Other abnormal glucose: Secondary | ICD-10-CM

## 2018-10-23 DIAGNOSIS — R5383 Other fatigue: Secondary | ICD-10-CM

## 2018-10-23 DIAGNOSIS — E88819 Insulin resistance, unspecified: Secondary | ICD-10-CM

## 2018-10-24 LAB — COMPLETE METABOLIC PANEL WITH GFR
AG Ratio: 2.1 (calc) (ref 1.0–2.5)
ALT: 23 U/L (ref 6–29)
AST: 23 U/L (ref 10–35)
Albumin: 4.5 g/dL (ref 3.6–5.1)
Alkaline phosphatase (APISO): 49 U/L (ref 37–153)
BUN: 17 mg/dL (ref 7–25)
CO2: 29 mmol/L (ref 20–32)
Calcium: 10.6 mg/dL — ABNORMAL HIGH (ref 8.6–10.4)
Chloride: 103 mmol/L (ref 98–110)
Creat: 0.82 mg/dL (ref 0.50–1.05)
GFR, Est African American: 93 mL/min/{1.73_m2} (ref >=60)
GFR, Est Non African American: 81 mL/min/{1.73_m2} (ref >=60)
Globulin: 2.1 g/dL (calc) (ref 1.9–3.7)
Glucose, Bld: 93 mg/dL (ref 65–99)
Potassium: 4.4 mmol/L (ref 3.5–5.3)
Sodium: 139 mmol/L (ref 135–146)
Total Bilirubin: 0.8 mg/dL (ref 0.2–1.2)
Total Protein: 6.6 g/dL (ref 6.1–8.1)

## 2018-10-24 LAB — LIPID PANEL
Cholesterol: 137 mg/dL (ref <200)
HDL: 45 mg/dL — ABNORMAL LOW (ref >=50)
LDL Cholesterol (Calc): 66 mg/dL (calc)
Non-HDL Cholesterol (Calc): 92 mg/dL (calc) (ref <130)
Total CHOL/HDL Ratio: 3 (calc) (ref <5.0)
Triglycerides: 188 mg/dL — ABNORMAL HIGH (ref <150)

## 2018-10-24 LAB — CBC WITH DIFFERENTIAL/PLATELET
Absolute Monocytes: 347 cells/uL (ref 200–950)
Basophils Absolute: 61 cells/uL (ref 0–200)
Basophils Relative: 1.2 %
Eosinophils Absolute: 326 cells/uL (ref 15–500)
Eosinophils Relative: 6.4 %
HCT: 37 % (ref 35.0–45.0)
Hemoglobin: 12.5 g/dL (ref 11.7–15.5)
Lymphs Abs: 1612 cells/uL (ref 850–3900)
MCH: 29.9 pg (ref 27.0–33.0)
MCHC: 33.8 g/dL (ref 32.0–36.0)
MCV: 88.5 fL (ref 80.0–100.0)
MPV: 11.2 fL (ref 7.5–12.5)
Monocytes Relative: 6.8 %
Neutro Abs: 2754 cells/uL (ref 1500–7800)
Neutrophils Relative %: 54 %
Platelets: 185 10*3/uL (ref 140–400)
RBC: 4.18 10*6/uL (ref 3.80–5.10)
RDW: 13.1 % (ref 11.0–15.0)
Total Lymphocyte: 31.6 %
WBC: 5.1 10*3/uL (ref 3.8–10.8)

## 2018-10-24 LAB — TSH: TSH: 1.84 mIU/L

## 2018-10-24 LAB — URINALYSIS, ROUTINE W REFLEX MICROSCOPIC
Bilirubin Urine: NEGATIVE
Glucose, UA: NEGATIVE
Hgb urine dipstick: NEGATIVE
Ketones, ur: NEGATIVE
Leukocytes,Ua: NEGATIVE
Nitrite: NEGATIVE
Protein, ur: NEGATIVE
Specific Gravity, Urine: 1.015 (ref 1.001–1.03)
pH: 7.5 (ref 5.0–8.0)

## 2018-10-24 LAB — LITHIUM LEVEL: Lithium Lvl: 0.9 mmol/L (ref 0.6–1.2)

## 2018-10-24 LAB — VITAMIN B12: Vitamin B-12: 1204 pg/mL — ABNORMAL HIGH (ref 200–1100)

## 2018-10-24 LAB — VITAMIN D 25 HYDROXY (VIT D DEFICIENCY, FRACTURES): Vit D, 25-Hydroxy: 75 ng/mL (ref 30–100)

## 2018-10-24 LAB — IRON, TOTAL/TOTAL IRON BINDING CAP
%SAT: 26 % (calc) (ref 16–45)
Iron: 92 ug/dL (ref 45–160)
TIBC: 353 mcg/dL (calc) (ref 250–450)

## 2018-10-24 LAB — INSULIN, RANDOM: Insulin: 11.1 u[IU]/mL

## 2018-10-24 LAB — MAGNESIUM: Magnesium: 1.9 mg/dL (ref 1.5–2.5)

## 2018-10-24 LAB — MICROALBUMIN / CREATININE URINE RATIO
Creatinine, Urine: 111 mg/dL (ref 20–275)
Microalb Creat Ratio: 5 mcg/mg creat (ref <30)
Microalb, Ur: 0.6 mg/dL

## 2018-10-24 LAB — HEMOGLOBIN A1C
Hgb A1c MFr Bld: 5.3 % of total Hgb (ref <5.7)
Mean Plasma Glucose: 105 (calc)
eAG (mmol/L): 5.8 (calc)

## 2018-10-25 ENCOUNTER — Encounter: Payer: Self-pay | Admitting: Gynecology

## 2018-10-25 LAB — TB SKIN TEST
Induration: 0 mm
TB Skin Test: NEGATIVE

## 2018-10-26 NOTE — Progress Notes (Signed)
10/26/2018--Patient is aware of lab results. -e welch

## 2018-11-07 ENCOUNTER — Other Ambulatory Visit: Payer: Self-pay

## 2018-11-07 DIAGNOSIS — Z1211 Encounter for screening for malignant neoplasm of colon: Secondary | ICD-10-CM

## 2018-11-07 LAB — POC HEMOCCULT BLD/STL (HOME/3-CARD/SCREEN)
Card #2 Fecal Occult Blod, POC: NEGATIVE
Card #3 Fecal Occult Blood, POC: NEGATIVE
Fecal Occult Blood, POC: NEGATIVE

## 2018-11-08 DIAGNOSIS — Z1211 Encounter for screening for malignant neoplasm of colon: Secondary | ICD-10-CM | POA: Diagnosis not present

## 2018-11-10 DIAGNOSIS — Z23 Encounter for immunization: Secondary | ICD-10-CM | POA: Diagnosis not present

## 2018-11-22 ENCOUNTER — Other Ambulatory Visit: Payer: Self-pay | Admitting: Internal Medicine

## 2018-11-22 DIAGNOSIS — F319 Bipolar disorder, unspecified: Secondary | ICD-10-CM

## 2018-11-22 MED ORDER — LITHIUM CARBONATE ER 300 MG PO TBCR: EXTENDED_RELEASE_TABLET | ORAL | 3 refills | Status: DC

## 2018-12-11 ENCOUNTER — Other Ambulatory Visit: Payer: Self-pay

## 2018-12-12 ENCOUNTER — Ambulatory Visit: Payer: BC Managed Care – PPO | Admitting: Obstetrics & Gynecology

## 2018-12-12 ENCOUNTER — Encounter: Payer: Self-pay | Admitting: Obstetrics & Gynecology

## 2018-12-12 VITALS — BP 112/70 | Ht 66.25 in | Wt 182.0 lb

## 2018-12-12 DIAGNOSIS — Z01419 Encounter for gynecological examination (general) (routine) without abnormal findings: Secondary | ICD-10-CM

## 2018-12-12 DIAGNOSIS — Z780 Asymptomatic menopausal state: Secondary | ICD-10-CM

## 2018-12-12 NOTE — Progress Notes (Signed)
Tanya Tucker July 07, 1960 [id number]   History:    58 y.o. G2P2L2 Married.  Son and daughter doing well.  RP:  Established patient presenting for annual gyn exam   HPI: Postmenopause, well on no HRT.  No PMB.  No pelvic pain.  No pain with intercourse.  Urine and bowel movements normal.  Seen by general surgeon, but decided not to proceed with hemorrhoidectomy at this point.  Breasts normal.  Patient had alopecia and is satisfied with natural hair glued to her scalp.  Body mass index 29.15.  Walking regularly and lifting small weights.  Health labs with family physician.  Past medical history,surgical history, family history and social history were all reviewed and documented in the EPIC chart.  Gynecologic History No LMP recorded. (Menstrual status: Perimenopausal). Contraception: post menopausal status Last Pap: 07/2017. Results were: Negative/HPV HR neg Last mammogram: 12/2016. Results were: Negative Bone Density: 08/2017 Normal Colonoscopy: 2019  Obstetric History OB History  Gravida Para Term Preterm AB Living  2 2       2   SAB TAB Ectopic Multiple Live Births               # Outcome Date GA Lbr Len/2nd Weight Sex Delivery Anes PTL Lv  2 Para           1 Para              ROS: A ROS was performed and pertinent positives and negatives are included in the history.  GENERAL: No fevers or chills. HEENT: No change in vision, no earache, sore throat or sinus congestion. NECK: No pain or stiffness. CARDIOVASCULAR: No chest pain or pressure. No palpitations. PULMONARY: No shortness of breath, cough or wheeze. GASTROINTESTINAL: No abdominal pain, nausea, vomiting or diarrhea, melena or bright red blood per rectum. GENITOURINARY: No urinary frequency, urgency, hesitancy or dysuria. MUSCULOSKELETAL: No joint or muscle pain, no back pain, no recent trauma. DERMATOLOGIC: No rash, no itching, no lesions. ENDOCRINE: No polyuria, polydipsia, no heat or cold intolerance. No recent change in  weight. HEMATOLOGICAL: No anemia or easy bruising or bleeding. NEUROLOGIC: No headache, seizures, numbness, tingling or weakness. PSYCHIATRIC: No depression, no loss of interest in normal activity or change in sleep pattern.     Exam:   BP 112/70   Ht 5' 6.25" (1.683 m)   Wt 182 lb (82.6 kg)   BMI 29.15 kg/m   Body mass index is 29.15 kg/m.  General appearance : Well developed well nourished female. No acute distress HEENT: Eyes: no retinal hemorrhage or exudates,  Neck supple, trachea midline, no carotid bruits, no thyroidmegaly Lungs: Clear to auscultation, no rhonchi or wheezes, or rib retractions  Heart: Regular rate and rhythm, no murmurs or gallops Breast:Examined in sitting and supine position were symmetrical in appearance, no palpable masses or tenderness,  no skin retraction, no nipple inversion, no nipple discharge, no skin discoloration, no axillary or supraclavicular lymphadenopathy Abdomen: no palpable masses or tenderness, no rebound or guarding Extremities: no edema or skin discoloration or tenderness  Pelvic: Vulva: Normal             Vagina: No gross lesions or discharge  Cervix: No gross lesions or discharge.    Uterus  AV, normal size, shape and consistency, non-tender and mobile  Adnexa  Without masses or tenderness  Anus: Normal   Assessment/Plan:  58 y.o. female for annual exam   1. Well female exam with routine gynecological exam Normal gynecologic exam.  Pap  test June 2019 was negative with negative high-risk HPV, therefore Pap test was not repeated this year.  Breast exam normal.  Patient will schedule a screening mammogram now.  Colonoscopy 2019.  Health labs with family physician.  2. Postmenopause Menopause, well on no hormone replacement therapy.  No postmenopausal bleeding.  Bone density in July 2019 was normal, will repeat at 5 years.  Vitamin D supplements, calcium intake of 1200 mg daily and regular weightbearing physical activity is recommended.   Genia Del MD, 3:59 PM 12/12/2018

## 2018-12-17 ENCOUNTER — Encounter: Payer: Self-pay | Admitting: Obstetrics & Gynecology

## 2018-12-17 NOTE — Patient Instructions (Signed)
1. Well female exam with routine gynecological exam Normal gynecologic exam.  Pap test June 2019 was negative with negative high-risk HPV, therefore Pap test was not repeated this year.  Breast exam normal.  Patient will schedule a screening mammogram now.  Colonoscopy 2019.  Health labs with family physician.  2. Postmenopause Menopause, well on no hormone replacement therapy.  No postmenopausal bleeding.  Bone density in July 2019 was normal, will repeat at 5 years.  Vitamin D supplements, calcium intake of 1200 mg daily and regular weightbearing physical activity is recommended.  [name], it was a pleasure seeing you today!

## 2019-01-22 ENCOUNTER — Ambulatory Visit: Payer: BC Managed Care – PPO | Admitting: Adult Health

## 2019-02-21 ENCOUNTER — Other Ambulatory Visit: Payer: Self-pay | Admitting: Internal Medicine

## 2019-02-21 DIAGNOSIS — I10 Essential (primary) hypertension: Secondary | ICD-10-CM

## 2019-03-27 ENCOUNTER — Ambulatory Visit: Payer: BC Managed Care – PPO | Admitting: Physician Assistant

## 2019-04-16 ENCOUNTER — Encounter: Payer: Self-pay | Admitting: Physician Assistant

## 2019-04-16 ENCOUNTER — Other Ambulatory Visit: Payer: Self-pay

## 2019-04-16 ENCOUNTER — Ambulatory Visit (INDEPENDENT_AMBULATORY_CARE_PROVIDER_SITE_OTHER): Payer: BC Managed Care – PPO | Admitting: Physician Assistant

## 2019-04-16 DIAGNOSIS — G4700 Insomnia, unspecified: Secondary | ICD-10-CM

## 2019-04-16 DIAGNOSIS — F319 Bipolar disorder, unspecified: Secondary | ICD-10-CM | POA: Diagnosis not present

## 2019-04-16 MED ORDER — LITHIUM CARBONATE ER 300 MG PO TBCR: EXTENDED_RELEASE_TABLET | ORAL | 3 refills | Status: DC

## 2019-04-16 NOTE — Progress Notes (Signed)
[facility] Med Check  Patient ID: Tanya Tucker,  MRN: [id number]  PCP: Unk Pinto, MD  Date of Evaluation: 04/16/2019 Time spent:20 minutes  Chief Complaint:  Chief Complaint    Follow-up      HISTORY/CURRENT STATUS: HPI for 53-month med check.  Tanya Tucker states she is doing well overall.  She is able to enjoy things.  Energy and motivation are good.  Work is going fine.  She is not missing any days for mental health reasons.  She does not cry easily.  Appetite is normal and weight is stable although she is trying to lose a little bit.  Denies suicidal or homicidal thoughts.  She denies increased energy with decreased need for sleep.  No impulsivity or risky behavior.  No increased libido or increased spending.  No grandiosity.  No hallucinations.  There is a little family strife at the present time.  Her son is living in the past and he will bring things up but then turn around and say that he does not want to talk about it.  She has advised him to get psychiatric help but so far he has not done that.  Another issue is that her husband who had esophageal cancer several years ago is now having some suspicious symptoms.  He has lost about 5 pounds in the past few months and is also vomiting at times after he eats.  He is extremely worried about it and of course it is concerning to her too.  He has an appointment next week with his GI to evaluate further.  Denies dizziness, syncope, seizures, numbness, tingling, tremor, tics, unsteady gait, slurred speech, confusion. Denies muscle or joint pain, stiffness, or dystonia.  Individual Medical History/ Review of Systems: Changes? :No    Past medications for mental health diagnoses include: Lithium, Seroquel  Allergies: Prednisolone  Current Medications:  Current Outpatient Medications:  .  BABY ASPIRIN PO, Take 81 mg by mouth 2 (two) times daily. , Disp: , Rfl:  .  calcium carbonate (TUMS - DOSED IN MG ELEMENTAL CALCIUM) 500  MG chewable tablet, Chew 1 tablet by mouth daily., Disp: , Rfl:  .  Cholecalciferol (VITAMIN D PO), Take 10,000 Units by mouth daily., Disp: , Rfl:  .  co-enzyme Q-10 30 MG capsule, Take 30 mg by mouth daily. , Disp: , Rfl:  .  lisinopril-hydrochlorothiazide (ZESTORETIC) 10-12.5 MG tablet, Take 1 tablet Daily for BP, Disp: 90 tablet, Rfl: 3 .  lithium carbonate (LITHOBID) 300 MG CR tablet, Take 1 tablet every Morning & 2 tablets at Bedtime, Disp: 270 tablet, Rfl: 3 .  Magnesium 250 MG TABS, Take 250 mg by mouth 2 (two) times daily., Disp: , Rfl:  .  Multiple Vitamin (MULTIVITAMIN WITH MINERALS) TABS tablet, Take 1 tablet by mouth daily., Disp: , Rfl:  .  rosuvastatin (CRESTOR) 40 MG tablet, TAKE 1/2 TO 1  TABLET  DAILY OR AS DIRECTED FOR CHOLESTEROL, Disp: 90 tablet, Rfl: 1 Medication Side Effects: none  Family Medical/ Social History: Changes? No  MENTAL HEALTH EXAM:  There were no vitals taken for this visit.There is no height or weight on file to calculate BMI.  General Appearance: Casual, Neat and Well Groomed  Eye Contact:  Good  Speech:  Clear and Coherent and Normal Rate  Volume:  Normal  Mood:  Euthymic  Affect:  Appropriate  Thought Process:  Goal Directed and Descriptions of Associations: Intact  Orientation:  Full (Time, Place, and Person)  Thought Content: Logical   Suicidal  Thoughts:  No  Homicidal Thoughts:  No  Memory:  WNL  Judgement:  Good  Insight:  Good  Psychomotor Activity:  Normal  Concentration:  Concentration: Good  Recall:  Good  Fund of Knowledge: Good  Language: Good  Assets:  Desire for Improvement  ADL's:  Intact  Cognition: WNL  Prognosis:  Good  10/23/2018 lithium level was 0.9.  TSH was 1.8.  BUN was 17 creatinine 0.8  DIAGNOSES:    ICD-10-CM   1. Bipolar I disorder (HCC)  F31.9   2. Bipolar affective disorder, remission status unspecified (HCC)  F31.9 lithium carbonate (LITHOBID) 300 MG CR tablet  3. Insomnia, unspecified type  G47.00      Receiving Psychotherapy: Yes With Tanya Tucker, [facility] C.   RECOMMENDATIONS:  PDMP was reviewed. I spent 20 minutes with her. Lab results from September were reviewed with her.  She has an appointment with her PCP within the next month and will have routine labs drawn again. Continue lithium 300 mg, 1 p.o. every morning and 2 p.o. nightly. Continue multivitamin, magnesium, co-Q10, calcium, vitamin D. Continue therapy with Tanya Tucker, [facility] C. Return in 6 months.  Tanya Overly, PA-C

## 2019-04-23 ENCOUNTER — Encounter: Payer: Self-pay | Admitting: Internal Medicine

## 2019-04-23 NOTE — Patient Instructions (Signed)
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
## 2019-04-24 ENCOUNTER — Other Ambulatory Visit: Payer: Self-pay

## 2019-04-24 ENCOUNTER — Ambulatory Visit: Payer: BC Managed Care – PPO | Admitting: Internal Medicine

## 2019-04-24 VITALS — BP 104/62 | HR 60 | Temp 97.4°F | Resp 16 | Ht 67.5 in | Wt 184.0 lb

## 2019-04-24 DIAGNOSIS — E782 Mixed hyperlipidemia: Secondary | ICD-10-CM

## 2019-04-24 DIAGNOSIS — Z79899 Other long term (current) drug therapy: Secondary | ICD-10-CM | POA: Diagnosis not present

## 2019-04-24 DIAGNOSIS — E559 Vitamin D deficiency, unspecified: Secondary | ICD-10-CM

## 2019-04-24 DIAGNOSIS — F319 Bipolar disorder, unspecified: Secondary | ICD-10-CM

## 2019-04-24 DIAGNOSIS — E8881 Metabolic syndrome: Secondary | ICD-10-CM

## 2019-04-24 DIAGNOSIS — I10 Essential (primary) hypertension: Secondary | ICD-10-CM | POA: Diagnosis not present

## 2019-04-24 DIAGNOSIS — R7309 Other abnormal glucose: Secondary | ICD-10-CM

## 2019-04-25 LAB — CBC WITH DIFFERENTIAL/PLATELET
Absolute Monocytes: 415 cells/uL (ref 200–950)
Basophils Absolute: 61 cells/uL (ref 0–200)
Basophils Relative: 0.9 %
Eosinophils Absolute: 496 cells/uL (ref 15–500)
Eosinophils Relative: 7.3 %
HCT: 36.5 % (ref 35.0–45.0)
Hemoglobin: 12 g/dL (ref 11.7–15.5)
Lymphs Abs: 1931 cells/uL (ref 850–3900)
MCH: 29.1 pg (ref 27.0–33.0)
MCHC: 32.9 g/dL (ref 32.0–36.0)
MCV: 88.6 fL (ref 80.0–100.0)
MPV: 10.8 fL (ref 7.5–12.5)
Monocytes Relative: 6.1 %
Neutro Abs: 3896 cells/uL (ref 1500–7800)
Neutrophils Relative %: 57.3 %
Platelets: 179 10*3/uL (ref 140–400)
RBC: 4.12 10*6/uL (ref 3.80–5.10)
RDW: 13.2 % (ref 11.0–15.0)
Total Lymphocyte: 28.4 %
WBC: 6.8 10*3/uL (ref 3.8–10.8)

## 2019-04-25 LAB — LITHIUM LEVEL: Lithium Lvl: 1 mmol/L (ref 0.6–1.2)

## 2019-04-25 LAB — LIPID PANEL
Cholesterol: 139 mg/dL (ref <200)
HDL: 41 mg/dL — ABNORMAL LOW (ref >=50)
LDL Cholesterol (Calc): 70 mg/dL (calc)
Non-HDL Cholesterol (Calc): 98 mg/dL (calc) (ref <130)
Total CHOL/HDL Ratio: 3.4 (calc) (ref <5.0)
Triglycerides: 222 mg/dL — ABNORMAL HIGH (ref <150)

## 2019-04-25 LAB — HEMOGLOBIN A1C
Hgb A1c MFr Bld: 5.3 % of total Hgb (ref <5.7)
Mean Plasma Glucose: 105 (calc)
eAG (mmol/L): 5.8 (calc)

## 2019-04-25 LAB — COMPLETE METABOLIC PANEL WITH GFR
AG Ratio: 2.1 (calc) (ref 1.0–2.5)
ALT: 21 U/L (ref 6–29)
AST: 21 U/L (ref 10–35)
Albumin: 4.7 g/dL (ref 3.6–5.1)
Alkaline phosphatase (APISO): 49 U/L (ref 37–153)
BUN/Creatinine Ratio: 23 (calc) — ABNORMAL HIGH (ref 6–22)
BUN: 26 mg/dL — ABNORMAL HIGH (ref 7–25)
CO2: 28 mmol/L (ref 20–32)
Calcium: 10.8 mg/dL — ABNORMAL HIGH (ref 8.6–10.4)
Chloride: 103 mmol/L (ref 98–110)
Creat: 1.15 mg/dL — ABNORMAL HIGH (ref 0.50–1.05)
GFR, Est African American: 62 mL/min/{1.73_m2} (ref >=60)
GFR, Est Non African American: 53 mL/min/{1.73_m2} — ABNORMAL LOW (ref >=60)
Globulin: 2.2 g/dL (calc) (ref 1.9–3.7)
Glucose, Bld: 96 mg/dL (ref 65–99)
Potassium: 4.6 mmol/L (ref 3.5–5.3)
Sodium: 139 mmol/L (ref 135–146)
Total Bilirubin: 0.6 mg/dL (ref 0.2–1.2)
Total Protein: 6.9 g/dL (ref 6.1–8.1)

## 2019-04-25 LAB — MAGNESIUM: Magnesium: 2.1 mg/dL (ref 1.5–2.5)

## 2019-04-25 LAB — INSULIN, RANDOM: Insulin: 22.5 u[IU]/mL — ABNORMAL HIGH

## 2019-04-25 LAB — VITAMIN D 25 HYDROXY (VIT D DEFICIENCY, FRACTURES): Vit D, 25-Hydroxy: 69 ng/mL (ref 30–100)

## 2019-04-25 LAB — TSH: TSH: 2.24 mIU/L (ref 0.40–4.50)

## 2019-04-27 ENCOUNTER — Other Ambulatory Visit: Payer: Self-pay | Admitting: Physician Assistant

## 2019-04-27 DIAGNOSIS — Z79899 Other long term (current) drug therapy: Secondary | ICD-10-CM

## 2019-04-27 NOTE — Progress Notes (Signed)
Thanks for sending the labs, Dr. Oneta Rack. Traci, please let her know that Lithium level is good. No dose change. The other labs that are most important for my purposes are the kidney functions, which are nl, the TSH is also normal.  Her calcium is creeping up, which can be from Lithium. It's not dangerous so don't worry, but I think we should repeat the level in 3 months to make sure it's not still going up.  I'll order the lab through [facility], and have the order mailed to her. Thanks.

## 2019-05-18 ENCOUNTER — Other Ambulatory Visit: Payer: Self-pay | Admitting: Internal Medicine

## 2019-05-18 DIAGNOSIS — E782 Mixed hyperlipidemia: Secondary | ICD-10-CM

## 2019-06-12 ENCOUNTER — Other Ambulatory Visit: Payer: Self-pay | Admitting: Internal Medicine

## 2019-06-12 DIAGNOSIS — Z1231 Encounter for screening mammogram for malignant neoplasm of breast: Secondary | ICD-10-CM

## 2019-06-22 ENCOUNTER — Encounter: Payer: Self-pay | Admitting: Physician Assistant

## 2019-06-22 ENCOUNTER — Ambulatory Visit: Payer: BC Managed Care – PPO | Admitting: Physician Assistant

## 2019-06-22 ENCOUNTER — Other Ambulatory Visit: Payer: Self-pay

## 2019-06-22 VITALS — BP 110/80 | HR 83 | Temp 97.3°F | Wt 183.0 lb

## 2019-06-22 DIAGNOSIS — H00014 Hordeolum externum left upper eyelid: Secondary | ICD-10-CM | POA: Diagnosis not present

## 2019-06-22 MED ORDER — BACITRACIN 500 UNIT/GM OP OINT: 1.0000 "application " | TOPICAL_OINTMENT | Freq: Three times a day (TID) | OPHTHALMIC | 0 refills | Status: DC

## 2019-06-22 MED ORDER — DOXYCYCLINE HYCLATE 100 MG PO CAPS: ORAL_CAPSULE | ORAL | 0 refills | Status: DC

## 2019-06-22 NOTE — Progress Notes (Signed)
SUBJECTIVE:  Tanya Tucker is a 59 y.o. female who complains of a left upper eyelid stye for 2 week(s). She was having some pus coming out about two days ago, thought it was getting better but it got worse. Putting vaseline on it.  No fever, chills, she has had post nasal drip but no URI symptoms otherwise, no history of foreign body in the eye.  Vision has been normal.  OBJECTIVE:  She appears well, vitals are normal. Hordeolum noted left upper eyelid. PERLA, fundi normal. Visual acuity normal. Ears, throat normal, no periorbital cellulitis, no neck lymphadenopathy.    ASSESSMENT AND PLAN:   Stye/hordeolum Frequent warm soaks  use antibiotic ophthalmic ointment as prescribed follow up if symptoms persist or worsen.  Do not wear your contacts, no makes up It may take several days for this to resolve.  Rarely, these persist or enlarge and in that event, she will need to see an Ophthalmologist. Patient agrees with the medical treatment plan.  The patient was advised to call immediately if she has any concerning symptoms in the interval. The patient voices understanding of current treatment options and is in agreement with the current care plan.The patient knows to call the clinic with any problems, questions or concerns or go to the ER if any further progression of symptoms.

## 2019-06-22 NOTE — Patient Instructions (Addendum)
Use the antibiotic ointment at night Take the antibiotic If you are not better than please go see your Ophthalmologist.   Stye  A stye, also known as a hordeolum, is a bump that forms on an eyelid. It may look like a pimple next to the eyelash. A stye can form inside the eyelid (internal stye) or outside the eyelid (external stye). A stye can cause redness, swelling, and pain on the eyelid. Styes are very common. Anyone can get them at any age. They usually occur in just one eye, but you may have more than one in either eye. What are the causes? A stye is caused by an infection. The infection is almost always caused by bacteria called Staphylococcus aureus. This is a common type of bacteria that lives on the skin. An internal stye may result from an infected oil-producing gland inside the eyelid. An external stye may be caused by an infection at the base of the eyelash (hair follicle). What increases the risk? You are more likely to develop a stye if:  You have had a stye before.  You have any of these conditions: ? Diabetes. ? Red, itchy, inflamed eyelids (blepharitis). ? A skin condition such as seborrheic dermatitis or rosacea. ? High fat levels in your blood (lipids). What are the signs or symptoms? The most common symptom of a stye is eyelid pain. Internal styes are more painful than external styes. Other symptoms may include:  Painful swelling of your eyelid.  A scratchy feeling in your eye.  Tearing and redness of your eye.  Pus draining from the stye. How is this diagnosed? Your health care provider may be able to diagnose a stye just by examining your eye. The health care provider may also check to make sure:  You do not have a fever or other signs of a more serious infection.  The infection has not spread to other parts of your eye or areas around your eye. How is this treated? Most styes will clear up in a few days without treatment or with warm compresses applied to  the area. You may need to use antibiotic drops or ointment to treat an infection. In some cases, if your stye does not heal with routine treatment, your health care provider may drain pus from the stye using a thin blade or needle. This may be done if the stye is large, causing a lot of pain, or affecting your vision. Follow these instructions at home:  Take over-the-counter and prescription medicines only as told by your health care provider. This includes eye drops or ointments.  If you were prescribed an antibiotic medicine, apply or use it as told by your health care provider. Do not stop using the antibiotic even if your condition improves.  Apply a warm, wet cloth (warm compress) to your eye for 5-10 minutes, 4 times a day.  Clean the affected eyelid as directed by your health care provider.  Do not wear contact lenses or eye makeup until your stye has healed.  Do not try to pop or drain the stye.  Do not rub your eye. Contact a health care provider if:  You have chills or a fever.  Your stye does not go away after several days.  Your stye affects your vision.  Your eyeball becomes swollen, red, or painful. Get help right away if:  You have pain when moving your eye around. Summary  A stye is a bump that forms on an eyelid. It may look  like a pimple next to the eyelash.  A stye can form inside the eyelid (internal stye) or outside the eyelid (external stye). A stye can cause redness, swelling, and pain on the eyelid.  Your health care provider may be able to diagnose a stye just by examining your eye.  Apply a warm, wet cloth (warm compress) to your eye for 5-10 minutes, 4 times a day. This information is not intended to replace advice given to you by your health care provider. Make sure you discuss any questions you have with your health care provider. Document Revised: 12/31/2016 Document Reviewed: 09/30/2016 Elsevier Patient Education  2020 [employer].

## 2019-07-03 DIAGNOSIS — H00034 Abscess of left upper eyelid: Secondary | ICD-10-CM | POA: Diagnosis not present

## 2019-07-11 ENCOUNTER — Telehealth: Payer: Self-pay | Admitting: Physician Assistant

## 2019-07-17 NOTE — Telephone Encounter (Signed)
error 

## 2019-07-26 ENCOUNTER — Ambulatory Visit: Payer: BC Managed Care – PPO | Admitting: Adult Health Nurse Practitioner
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
## 2019-08-21 ENCOUNTER — Other Ambulatory Visit: Payer: Self-pay

## 2019-08-21 ENCOUNTER — Ambulatory Visit: Payer: BC Managed Care – PPO | Admitting: Adult Health Nurse Practitioner

## 2019-08-21 ENCOUNTER — Encounter: Payer: Self-pay | Admitting: Adult Health Nurse Practitioner

## 2019-08-21 VITALS — BP 118/80 | HR 65 | Temp 95.4°F | Wt 180.0 lb

## 2019-08-21 DIAGNOSIS — E8881 Metabolic syndrome: Secondary | ICD-10-CM

## 2019-08-21 DIAGNOSIS — Z87891 Personal history of nicotine dependence: Secondary | ICD-10-CM

## 2019-08-21 DIAGNOSIS — Z13220 Encounter for screening for lipoid disorders: Secondary | ICD-10-CM

## 2019-08-21 DIAGNOSIS — E88819 Insulin resistance, unspecified: Secondary | ICD-10-CM

## 2019-08-21 DIAGNOSIS — F319 Bipolar disorder, unspecified: Secondary | ICD-10-CM

## 2019-08-21 DIAGNOSIS — E559 Vitamin D deficiency, unspecified: Secondary | ICD-10-CM | POA: Diagnosis not present

## 2019-08-21 DIAGNOSIS — I10 Essential (primary) hypertension: Secondary | ICD-10-CM

## 2019-08-21 DIAGNOSIS — Z23 Encounter for immunization: Secondary | ICD-10-CM

## 2019-08-21 DIAGNOSIS — Z79899 Other long term (current) drug therapy: Secondary | ICD-10-CM

## 2019-08-21 DIAGNOSIS — Z1329 Encounter for screening for other suspected endocrine disorder: Secondary | ICD-10-CM

## 2019-08-21 DIAGNOSIS — Z1389 Encounter for screening for other disorder: Secondary | ICD-10-CM | POA: Diagnosis not present

## 2019-08-21 DIAGNOSIS — R0782 Intercostal pain: Secondary | ICD-10-CM

## 2019-08-21 DIAGNOSIS — Z130 Encounter for screening for diseases of the blood and blood-forming organs and certain disorders involving the immune mechanism: Secondary | ICD-10-CM

## 2019-08-21 DIAGNOSIS — Z131 Encounter for screening for diabetes mellitus: Secondary | ICD-10-CM

## 2019-08-21 DIAGNOSIS — E663 Overweight: Secondary | ICD-10-CM

## 2019-08-21 DIAGNOSIS — Z0000 Encounter for general adult medical examination without abnormal findings: Secondary | ICD-10-CM

## 2019-08-21 DIAGNOSIS — E782 Mixed hyperlipidemia: Secondary | ICD-10-CM

## 2019-08-21 DIAGNOSIS — S39012A Strain of muscle, fascia and tendon of lower back, initial encounter: Secondary | ICD-10-CM

## 2019-08-21 DIAGNOSIS — Z1321 Encounter for screening for nutritional disorder: Secondary | ICD-10-CM

## 2019-08-21 DIAGNOSIS — R7309 Other abnormal glucose: Secondary | ICD-10-CM

## 2019-08-21 NOTE — Patient Instructions (Addendum)
You received the Tdap vaccination today.  This is good for 10years.   HOW TO SCHEDULE Mammogram  [facility]  7 a.m.-6:30 p.m., Monday 7 a.m.-5 p.m., Tuesday-Friday Schedule an appointment by calling [phone].   We will send in meloxicam 15mg  take one tablet by mouth daily.  Take this with food. Take this for two weeks.  Once pain is improved consider doing the strengthening exercises below.   BACK PAIN  Try the exercises and other information in the back care manual.   You can take meloxicam once during the day as needed (avoid taking other NSAIDS like Alleve or Ibuprofen while taking this)   Go to the ER if you have any new weakness in your legs, have trouble controlling your urine or bowels, or have worsening pain.   If you are not better in 1-3 month we will refer you to ortho   Back pain Rehab Ask your health care provider which exercises are safe for you. Do exercises exactly as told by your health care provider and adjust them as directed. It is normal to feel mild stretching, pulling, tightness, or discomfort as you do these exercises, but you should stop right away if you feel sudden pain or your pain gets worse. Do not begin these exercises until told by your health care provider. Stretching and range of motion exercises These exercises warm up your muscles and joints and improve the movement and flexibility of your hips and your back. These exercises also help to relieve pain, numbness, and tingling. Exercise A: Sciatic nerve glide 1. Sit in a chair with your head facing down toward your chest. Place your hands behind your back. Let your shoulders slump forward. 2. Slowly straighten one of your knees while you tilt your head back as if you are looking toward the ceiling. Only straighten your leg as far as you can without making your symptoms worse. 3. Hold for __________ seconds. 4. Slowly return to the starting position. 5. Repeat  with your other leg. Repeat __________ times. Complete this exercise __________ times a day. Exercise B: Knee to chest with hip adduction and internal rotation  1. Lie on your back on a firm surface with both legs straight. 2. Bend one of your knees and move it up toward your chest until you feel a gentle stretch in your lower back and buttock. Then, move your knee toward the shoulder that is on the opposite side from your leg. ? Hold your leg in this position by holding onto the front of your knee. 3. Hold for __________ seconds. 4. Slowly return to the starting position. 5. Repeat with your other leg. Repeat __________ times. Complete this exercise __________ times a day. Exercise C: Prone extension on elbows  1. Lie on your abdomen on a firm surface. A bed may be too soft for this exercise. 2. Prop yourself up on your elbows. 3. Use your arms to help lift your chest up until you feel a gentle stretch in your abdomen and your lower back. ? This will place some of your body weight on your elbows. If this is uncomfortable, try stacking pillows under your chest. ? Your hips should stay down, against the surface that you are lying on. Keep your hip and back muscles relaxed. 4. Hold for __________ seconds. 5. Slowly relax your upper body and return to the starting position. Repeat __________ times. Complete this exercise __________ times a day. Strengthening exercises These exercises build  strength and endurance in your back. Endurance is the ability to use your muscles for a long time, even after they get tired. Exercise D: Pelvic tilt 1. Lie on your back on a firm surface. Bend your knees and keep your feet flat. 2. Tense your abdominal muscles. Tip your pelvis up toward the ceiling and flatten your lower back into the floor. ? To help with this exercise, you may place a small towel under your lower back and try to push your back into the towel. 3. Hold for __________ seconds. 4. Let your  muscles relax completely before you repeat this exercise. Repeat __________ times. Complete this exercise __________ times a day. Exercise E: Alternating arm and leg raises  1. Get on your hands and knees on a firm surface. If you are on a hard floor, you may want to use padding to cushion your knees, such as an exercise mat. 2. Line up your arms and legs. Your hands should be below your shoulders, and your knees should be below your hips. 3. Lift your left leg behind you. At the same time, raise your right arm and straighten it in front of you. ? Do not lift your leg higher than your hip. ? Do not lift your arm higher than your shoulder. ? Keep your abdominal and back muscles tight. ? Keep your hips facing the ground. ? Do not arch your back. ? Keep your balance carefully, and do not hold your breath. 4. Hold for __________ seconds. 5. Slowly return to the starting position and repeat with your right leg and your left arm. Repeat __________ times. Complete this exercise __________ times a day. Posture and body mechanics  Body mechanics refers to the movements and positions of your body while you do your daily activities. Posture is part of body mechanics. Good posture and healthy body mechanics can help to relieve stress in your body's tissues and joints. Good posture means that your spine is in its natural S-curve position (your spine is neutral), your shoulders are pulled back slightly, and your head is not tipped forward. The following are general guidelines for applying improved posture and body mechanics to your everyday activities. Standing   When standing, keep your spine neutral and your feet about hip-width apart. Keep a slight bend in your knees. Your ears, shoulders, and hips should line up.  When you do a task in which you stand in one place for a long time, place one foot up on a stable object that is 2-4 inches (5-10 cm) high, such as a footstool. This helps keep your spine  neutral. Sitting   When sitting, keep your spine neutral and keep your feet flat on the floor. Use a footrest, if necessary, and keep your thighs parallel to the floor. Avoid rounding your shoulders, and avoid tilting your head forward.  When working at a desk or a computer, keep your desk at a height where your hands are slightly lower than your elbows. Slide your chair under your desk so you are close enough to maintain good posture.  When working at a computer, place your monitor at a height where you are looking straight ahead and you do not have to tilt your head forward or downward to look at the screen. Resting   When lying down and resting, avoid positions that are most painful for you.  If you have pain with activities such as sitting, bending, stooping, or squatting (flexion-based activities), lie in a position in which  your body does not bend very much. For example, avoid curling up on your side with your arms and knees near your chest (fetal position).  If you have pain with activities such as standing for a long time or reaching with your arms (extension-based activities), lie with your spine in a neutral position and bend your knees slightly. Try the following positions: ? Lying on your side with a pillow between your knees. ? Lying on your back with a pillow under your knees. Lifting   When lifting objects, keep your feet at least shoulder-width apart and tighten your abdominal muscles.  Bend your knees and hips and keep your spine neutral. It is important to lift using the strength of your legs, not your back. Do not lock your knees straight out.  Always ask for help to lift heavy or awkward objects. This information is not intended to replace advice given to you by your health care provider. Make sure you discuss any questions you have with your health care provider. Document Released: 01/18/2005 Document Revised: 09/25/2015 Document Reviewed: 10/04/2014 Elsevier  Interactive Patient Education  2018 [employer].    Ask insurance and pharmacy about shingrix - it is a 2 part shot that we will not be getting in the office.   Suggest getting AFTER covid vaccines, have to wait at least a month This shot can make you feel bad due to such good immune response it can trigger some inflammation so take tylenol or aleve day of or day after and plan on resting.   Can go to [employer]cy for more information  Shingrix Vaccination  Two vaccines are licensed and recommended to prevent shingles in the [place] Zoster vaccine live (ZVL, Zostavax) has been in use since 2006. Recombinant zoster vaccine (RZV, Shingrix), has been in use since 2017 and is recommended by ACIP as the preferred shingles vaccine.  What Everyone Should Know about Shingles Vaccine (Shingrix) One of the Recommended Vaccines by Disease Shingles vaccination is the only way to protect against shingles and postherpetic neuralgia (PHN), the most common complication from shingles. CDC recommends that healthy adults 50 years and older get two doses of the shingles vaccine called Shingrix (recombinant zoster vaccine), separated by 2 to 6 months, to prevent shingles and the complications from the disease. Your doctor or pharmacist can give you Shingrix as a shot in your upper arm. Shingrix provides strong protection against shingles and PHN. Two doses of Shingrix is more than 90% effective at preventing shingles and PHN. Protection stays above 85% for at least the first four years after you get vaccinated. Shingrix is the preferred vaccine, over Zostavax (zoster vaccine live), a shingles vaccine in use since 2006. Zostavax may still be used to prevent shingles in healthy adults 60 years and older. For example, you could use Zostavax if a person is allergic to Shingrix, prefers Zostavax, or requests immediate vaccination and Shingrix is unavailable. Who  Should Get Shingrix? Healthy adults 50 years and older should get two doses of Shingrix, separated by 2 to 6 months. You should get Shingrix even if in the past you . had shingles  . received Zostavax  . are not sure if you had chickenpox There is no maximum age for getting Shingrix. If you had shingles in the past, you can get Shingrix to help prevent future occurrences of the disease. There is no specific length of time that you need to wait after having shingles before you can receive Shingrix, but generally you should  make sure the shingles rash has gone away before getting vaccinated. You can get Shingrix whether or not you remember having had chickenpox in the past. Studies show that more than 99% of Americans 40 years and older have had chickenpox, even if they don't remember having the disease. Chickenpox and shingles are related because they are caused by the same virus (varicella zoster virus). After a person recovers from chickenpox, the virus stays dormant (inactive) in the body. It can reactivate years later and cause shingles. If you had Zostavax in the recent past, you should wait at least eight weeks before getting Shingrix. Talk to your healthcare provider to determine the best time to get Shingrix. Shingrix is available in [employer] and pharmacies. To find doctor's offices or pharmacies near you that offer the vaccine, visit HealthMap Vaccine FinderExternal. If you have questions about Shingrix, talk with your healthcare provider. Vaccine for Those 50 Years and Older  Shingrix reduces the risk of shingles and PHN by more than 90% in people 46 and older. CDC recommends the vaccine for healthy adults 50 and older.  Who Should Not Get Shingrix? You should not get Shingrix if you: . have ever had a severe allergic reaction to any component of the vaccine or after a dose of Shingrix  . tested negative for immunity to varicella zoster virus. If you test negative, you should get  chickenpox vaccine.  . currently have shingles  . currently are pregnant or breastfeeding. Women who are pregnant or breastfeeding should wait to get Shingrix.  Marland Kitchen receive specific antiviral drugs (acyclovir, famciclovir, or valacyclovir) 24 hours before vaccination (avoid use of these antiviral drugs for 14 days after vaccination)- zoster vaccine live only If you have a minor acute (starts suddenly) illness, such as a cold, you may get Shingrix. But if you have a moderate or severe acute illness, you should usually wait until you recover before getting the vaccine. This includes anyone with a temperature of 101.69F or higher. The side effects of the Shingrix are temporary, and usually last 2 to 3 days. While you may experience pain for a few days after getting Shingrix, the pain will be less severe than having shingles and the complications from the disease. How Well Does Shingrix Work? Two doses of Shingrix provides strong protection against shingles and postherpetic neuralgia (PHN), the most common complication of shingles. . In adults 68 to 57 years old who got two doses, Shingrix was 97% effective in preventing shingles; among adults 70 years and older, Shingrix was 91% effective.  . In adults 38 to 57 years old who got two doses, Shingrix was 91% effective in preventing PHN; among adults 70 years and older, Shingrix was 89% effective. Shingrix protection remained high (more than 85%) in people 70 years and older throughout the four years following vaccination. Since your risk of shingles and PHN increases as you get older, it is important to have strong protection against shingles in your older years. Top of Page  What Are the Possible Side Effects of Shingrix? Studies show that Shingrix is safe. The vaccine helps your body create a strong defense against shingles. As a result, you are likely to have temporary side effects from getting the shots. The side effects may affect your ability to do  normal daily activities for 2 to 3 days. Most people got a sore arm with mild or moderate pain after getting Shingrix, and some also had redness and swelling where they got the shot. Some people  felt tired, had muscle pain, a headache, shivering, fever, stomach pain, or nausea. About 1 out of 6 people who got Shingrix experienced side effects that prevented them from doing regular activities. Symptoms went away on their own in about 2 to 3 days. Side effects were more common in younger people. You might have a reaction to the first or second dose of Shingrix, or both doses. If you experience side effects, you may choose to take over-the-counter pain medicine such as ibuprofen or acetaminophen. If you experience side effects from Shingrix, you should report them to the Vaccine Adverse Event Reporting System (VAERS). Your doctor might file this report, or you can do it yourself through the VAERS websiteExternal, or by calling [phone]. If you have any questions about side effects from Shingrix, talk with your doctor. The shingles vaccine does not contain thimerosal (a preservative containing mercury). Top of Page  When Should I See a Doctor Because of the Side Effects I Experience From Shingrix? In clinical trials, Shingrix was not associated with serious adverse events. In fact, serious side effects from vaccines are extremely rare. For example, for every 1 million doses of a vaccine given, only one or two people may have a severe allergic reaction. Signs of an allergic reaction happen within minutes or hours after vaccination and include hives, swelling of the face and throat, difficulty breathing, a fast heartbeat, dizziness, or weakness. If you experience these or any other life-threatening symptoms, see a doctor right away. Shingrix causes a strong response in your immune system, so it may produce short-term side effects more intense than you are used to from other vaccines. These side effects can  be uncomfortable, but they are expected and usually go away on their own in 2 or 3 days. Top of Page  How Can I Pay For Shingrix? There are several ways shingles vaccine may be paid for: Medicare . Medicare Part D plans cover the shingles vaccine, but there may be a cost to you depending on your plan. There may be a copay for the vaccine, or you may need to pay in full then get reimbursed for a certain amount.  . Medicare Part B does not cover the shingles vaccine. Medicaid . Medicaid may or may not cover the vaccine. Contact your insurer to find out. Private health insurance . Many private health insurance plans will cover the vaccine, but there may be a cost to you depending on your plan. Contact your insurer to find out. Vaccine assistance programs . Some pharmaceutical companies provide vaccines to eligible adults who cannot afford them. You may want to check with the vaccine manufacturer, [employer], about Shingrix. If you do not currently have health insurance, learn more about affordable health coverage optionsExternal. To find doctor's offices or pharmacies near you that offer the vaccine, visit HealthMap Vaccine FinderExternal.

## 2019-08-23 ENCOUNTER — Other Ambulatory Visit: Payer: Self-pay

## 2019-08-23 DIAGNOSIS — Z0001 Encounter for general adult medical examination with abnormal findings: Secondary | ICD-10-CM

## 2019-08-23 LAB — VITAMIN B12: Vitamin B-12: 1446 pg/mL — ABNORMAL HIGH (ref 200–1100)

## 2019-08-23 LAB — COMPLETE METABOLIC PANEL WITH GFR
AG Ratio: 2 (calc) (ref 1.0–2.5)
ALT: 20 U/L (ref 6–29)
AST: 23 U/L (ref 10–35)
Albumin: 4.7 g/dL (ref 3.6–5.1)
Alkaline phosphatase (APISO): 59 U/L (ref 37–153)
BUN: 19 mg/dL (ref 7–25)
CO2: 28 mmol/L (ref 20–32)
Calcium: 10.7 mg/dL — ABNORMAL HIGH (ref 8.6–10.4)
Chloride: 104 mmol/L (ref 98–110)
Creat: 0.89 mg/dL (ref 0.50–1.05)
GFR, Est African American: 84 mL/min/{1.73_m2} (ref >=60)
GFR, Est Non African American: 72 mL/min/{1.73_m2} (ref >=60)
Globulin: 2.3 g/dL (calc) (ref 1.9–3.7)
Glucose, Bld: 88 mg/dL (ref 65–99)
Potassium: 4.5 mmol/L (ref 3.5–5.3)
Sodium: 140 mmol/L (ref 135–146)
Total Bilirubin: 0.9 mg/dL (ref 0.2–1.2)
Total Protein: 7 g/dL (ref 6.1–8.1)

## 2019-08-23 LAB — MICROALBUMIN / CREATININE URINE RATIO
Creatinine, Urine: 55 mg/dL (ref 20–275)
Microalb Creat Ratio: 5 mcg/mg creat (ref <30)
Microalb, Ur: 0.3 mg/dL

## 2019-08-23 LAB — URINALYSIS W MICROSCOPIC + REFLEX CULTURE
Bacteria, UA: NONE SEEN /HPF
Bilirubin Urine: NEGATIVE
Glucose, UA: NEGATIVE
Hgb urine dipstick: NEGATIVE
Hyaline Cast: NONE SEEN /LPF
Ketones, ur: NEGATIVE
Nitrites, Initial: NEGATIVE
Protein, ur: NEGATIVE
RBC / HPF: NONE SEEN /HPF (ref 0–2)
Specific Gravity, Urine: 1.009 (ref 1.001–1.03)
Squamous Epithelial / HPF: NONE SEEN /HPF (ref <=5)
pH: 7 (ref 5.0–8.0)

## 2019-08-23 LAB — CBC WITH DIFFERENTIAL/PLATELET
Absolute Monocytes: 420 cells/uL (ref 200–950)
Basophils Absolute: 40 cells/uL (ref 0–200)
Basophils Relative: 0.8 %
Eosinophils Absolute: 260 cells/uL (ref 15–500)
Eosinophils Relative: 5.2 %
HCT: 37.9 % (ref 35.0–45.0)
Hemoglobin: 12.5 g/dL (ref 11.7–15.5)
Lymphs Abs: 1485 cells/uL (ref 850–3900)
MCH: 28.8 pg (ref 27.0–33.0)
MCHC: 33 g/dL (ref 32.0–36.0)
MCV: 87.3 fL (ref 80.0–100.0)
MPV: 10.8 fL (ref 7.5–12.5)
Monocytes Relative: 8.4 %
Neutro Abs: 2795 cells/uL (ref 1500–7800)
Neutrophils Relative %: 55.9 %
Platelets: 212 10*3/uL (ref 140–400)
RBC: 4.34 10*6/uL (ref 3.80–5.10)
RDW: 13 % (ref 11.0–15.0)
Total Lymphocyte: 29.7 %
WBC: 5 10*3/uL (ref 3.8–10.8)

## 2019-08-23 LAB — LIPID PANEL
Cholesterol: 145 mg/dL (ref <200)
HDL: 44 mg/dL — ABNORMAL LOW (ref >=50)
LDL Cholesterol (Calc): 77 mg/dL (calc)
Non-HDL Cholesterol (Calc): 101 mg/dL (calc) (ref <130)
Total CHOL/HDL Ratio: 3.3 (calc) (ref <5.0)
Triglycerides: 142 mg/dL (ref <150)

## 2019-08-23 LAB — URINE CULTURE
MICRO NUMBER:: 10731558
SPECIMEN QUALITY:: ADEQUATE

## 2019-08-23 LAB — HEMOGLOBIN A1C
Hgb A1c MFr Bld: 5.3 % of total Hgb (ref <5.7)
Mean Plasma Glucose: 105 (calc)
eAG (mmol/L): 5.8 (calc)

## 2019-08-23 LAB — IRON, TOTAL/TOTAL IRON BINDING CAP: Iron: 60 ug/dL (ref 45–160)

## 2019-08-23 LAB — MAGNESIUM: Magnesium: 2.2 mg/dL (ref 1.5–2.5)

## 2019-08-23 LAB — INSULIN, RANDOM: Insulin: 7.8 u[IU]/mL

## 2019-08-23 LAB — TSH: TSH: 2.4 mIU/L (ref 0.40–4.50)

## 2019-08-23 LAB — CULTURE INDICATED

## 2019-08-23 LAB — VITAMIN D 25 HYDROXY (VIT D DEFICIENCY, FRACTURES): Vit D, 25-Hydroxy: 73 ng/mL (ref 30–100)

## 2019-08-24 ENCOUNTER — Other Ambulatory Visit: Payer: Self-pay | Admitting: Internal Medicine

## 2019-08-24 ENCOUNTER — Other Ambulatory Visit: Payer: Self-pay | Admitting: Adult Health Nurse Practitioner

## 2019-08-24 MED ORDER — MELOXICAM 15 MG PO TABS
ORAL_TABLET | ORAL | 0 refills | Status: DC
Start: 2019-08-24 — End: 2019-10-25

## 2019-10-17 ENCOUNTER — Telehealth (INDEPENDENT_AMBULATORY_CARE_PROVIDER_SITE_OTHER): Payer: BC Managed Care – PPO | Admitting: Physician Assistant

## 2019-10-17 ENCOUNTER — Encounter: Payer: Self-pay | Admitting: Physician Assistant

## 2019-10-17 ENCOUNTER — Telehealth: Payer: Self-pay | Admitting: Physician Assistant

## 2019-10-17 DIAGNOSIS — G4700 Insomnia, unspecified: Secondary | ICD-10-CM | POA: Diagnosis not present

## 2019-10-17 DIAGNOSIS — Z79899 Other long term (current) drug therapy: Secondary | ICD-10-CM | POA: Diagnosis not present

## 2019-10-17 DIAGNOSIS — F319 Bipolar disorder, unspecified: Secondary | ICD-10-CM | POA: Diagnosis not present

## 2019-10-17 NOTE — Progress Notes (Signed)
[facility]  Patient ID: Tanya Tucker,  MRN: [id number]  PCP: Lucky Cowboy, MD  Date of Evaluation: 10/17/2019 Time spent:20 minutes  Chief Complaint:  Chief Complaint    Follow-up     Virtual Visit via Telehealth  I connected with patient by a video enabled telemedicine application with their informed consent, and verified patient privacy and that I am speaking with the correct person using two identifiers.  I am private, in my office and the patient is at home.  I discussed the limitations, risks, security and privacy concerns of performing an evaluation and management service by video and the availability of in person appointments. I also discussed with the patient that there may be a patient responsible charge related to this service. The patient expressed understanding and agreed to proceed.   I discussed the assessment and treatment plan with the patient. The patient was provided an opportunity to ask questions and all were answered. The patient agreed with the plan and demonstrated an understanding of the instructions.   The patient was advised to call back or seek an in-person evaluation if the symptoms worsen or if the condition fails to improve as anticipated.  I provided 20 minutes of non-face-to-face time during this encounter.  HISTORY/CURRENT STATUS: HPI for 52-month med check.  States she is doing very well.  She is able to enjoy things.  She is still working from home a lot since the pandemic started.  She does not really like that because she would rather be out of the house and around other people.  But she does okay with it.  She is not crying easily.  No anxiety to speak of.  Not isolating.  Denies suicidal or homicidal thoughts.  The only real concern is that she is not sleeping well sometimes.  It is not every night.  She had some old Seroquel from several years ago and she has taken that a couple of times to help with sleep.  It has been  helpful.  Most of the time she gets around 6 to 8 hours but here lately it has been very interrupted sleep.  Has been getting up multiple times to go to the restroom.  She can usually go back to sleep pretty quickly but here in the past few months that has been difficult.  The Seroquel has been helpful.  A lot of good things have happened since we last saw each other.  Her son got engaged and will be getting married next June.  She is really excited about that.  Her husband is doing well.  He was diagnosed with esophageal cancer several years ago and is still in remission.  She denies increased energy with decreased need for sleep.  No impulsivity or risky behavior.  No increased libido or increased spending.  No grandiosity.  No hallucinations.  Denies dizziness, syncope, seizures, numbness, tingling, tremor, tics, unsteady gait, slurred speech, confusion. Denies muscle or joint pain, stiffness, or dystonia.  Individual Medical History/ Review of Systems: Changes? :No    Past medications for mental health diagnoses include: Lithium, Seroquel  Allergies: Prednisolone  Current Medications:  Current Outpatient Medications:  .  BABY ASPIRIN PO, Take 81 mg by mouth 2 (two) times daily. , Disp: , Rfl:  .  calcium carbonate (TUMS - DOSED IN MG ELEMENTAL CALCIUM) 500 MG chewable tablet, Chew 1 tablet by mouth daily., Disp: , Rfl:  .  Cholecalciferol (VITAMIN D PO), Take 10,000 Units by mouth daily., Disp: ,  Rfl:  .  co-enzyme Q-10 30 MG capsule, Take 30 mg by mouth daily. , Disp: , Rfl:  .  lisinopril-hydrochlorothiazide (ZESTORETIC) 10-12.5 MG tablet, Take 1 tablet Daily for BP, Disp: 90 tablet, Rfl: 3 .  lithium carbonate (LITHOBID) 300 MG CR tablet, Take 1 tablet every Morning & 2 tablets at Bedtime, Disp: 270 tablet, Rfl: 3 .  Magnesium 250 MG TABS, Take 250 mg by mouth 2 (two) times daily., Disp: , Rfl:  .  Multiple Vitamin (MULTIVITAMIN WITH MINERALS) TABS tablet, Take 1 tablet by mouth  daily., Disp: , Rfl:  .  rosuvastatin (CRESTOR) 40 MG tablet, TAKE 1/2 TO 1 (ONE-HALF TO ONE) TABLET BY MOUTH ONCE DAILY OR AS DIRECTED FOR CHOLESTEROL, Disp: 90 tablet, Rfl: 0 .  bacitracin ophthalmic ointment, Place 1 application into the left eye 3 (three) times daily. apply to eye (Patient not taking: Reported on 10/17/2019), Disp: 3.5 g, Rfl: 0 .  meloxicam (MOBIC) 15 MG tablet, Take 1/2 to 1 tablet Daily with Food for Pain & Inflammation (Patient not taking: Reported on 10/17/2019), Disp: 90 tablet, Rfl: 0 .  QUEtiapine (SEROQUEL) 50 MG tablet, Take 1 tablet (50 mg total) by mouth at bedtime as needed., Disp: 30 tablet, Rfl: 0 Medication Side Effects: none  Family Medical/ Social History: Changes? No  MENTAL HEALTH EXAM:  There were no vitals taken for this visit.There is no height or weight on file to calculate BMI.  General Appearance: Casual, Neat and Well Groomed  Eye Contact:  Good  Speech:  Clear and Coherent and Normal Rate  Volume:  Normal  Mood:  Euthymic  Affect:  Appropriate  Thought Process:  Goal Directed and Descriptions of Associations: Intact  Orientation:  Full (Time, Place, and Person)  Thought Content: Logical   Suicidal Thoughts:  No  Homicidal Thoughts:  No  Memory:  WNL  Judgement:  Good  Insight:  Good  Psychomotor Activity:  Normal  Concentration:  Concentration: Good  Recall:  Good  Fund of Knowledge: Good  Language: Good  Assets:  Desire for Improvement  ADL's:  Intact  Cognition: WNL  Prognosis:  Good   04/24/2019 Lithium 1.0  08/21/2019 TSH was 2.4, BUN/Cr 19/.8, glucose was 88, lipid panel shows total cholesterol 145, HDL was 44, LDL 77, triglycerides 142 Lithium level was not drawn during this visit.  DIAGNOSES:    ICD-10-CM   1. Encounter for long-term (current) use of medications  Z79.899 Lithium level  2. Bipolar I disorder (HCC)  F31.9   3. Insomnia, unspecified type  G47.00     Receiving Psychotherapy: Yes With Ulice Bold, [facility]  C.   RECOMMENDATIONS:  PDMP was reviewed. I provided 20 minutes of non face to face time during this encounter.  I'm glad to see her doing so well! Continue Seroquel 50 mg, 1 p.o. nightly as needed sleep. Continue lithium 300 mg, 1 p.o. every morning and 2 p.o. nightly.  Order from lithium level will be mailed to her.  She should have it at her PCPs office at the next visit. Continue multivitamin, magnesium, co-Q10, calcium, vitamin D. Continue therapy with Ulice Bold, [facility]. Return in 6 months.  Melony Overly, PA-C

## 2019-10-17 NOTE — Telephone Encounter (Signed)
Ms. kiyanna, biegler are scheduled for a virtual visit with your provider today.    Just as we do with appointments in the office, we must obtain your consent to participate.  Your consent will be active for this visit and any virtual visit you may have with one of our providers in the next 365 days.    If you have a MyChart account, I can also send a copy of this consent to you electronically.  All virtual visits are billed to your insurance company just like a traditional visit in the office.  As this is a virtual visit, video technology does not allow for your provider to perform a traditional examination.  This may limit your provider's ability to fully assess your condition.  If your provider identifies any concerns that need to be evaluated in person or the need to arrange testing such as labs, EKG, etc, we will make arrangements to do so.    Although advances in technology are sophisticated, we cannot ensure that it will always work on either your end or our end.  If the connection with a video visit is poor, we may have to switch to a telephone visit.  With either a video or telephone visit, we are not always able to ensure that we have a secure connection.   I need to obtain your verbal consent now.   Are you willing to proceed with your visit today?   Jamiesha Esquilin has provided verbal consent on 10/17/2019 for a virtual visit (video or telephone).   Melony Overly, PA-C 10/17/2019  4:37 PM

## 2019-10-18 ENCOUNTER — Telehealth: Payer: Self-pay | Admitting: Physician Assistant

## 2019-10-18 MED ORDER — QUETIAPINE FUMARATE 50 MG PO TABS
50.0000 mg | ORAL_TABLET | Freq: Every evening | ORAL | 0 refills | Status: DC | PRN
Start: 2019-10-18 — End: 2022-04-14

## 2019-10-18 NOTE — Telephone Encounter (Signed)
[name], asch are scheduled for a virtual visit with your provider today.    Just as we do with appointments in the office, we must obtain your consent to participate.  Your consent will be active for this visit and any virtual visit you may have with one of our providers in the next 365 days.    If you have a MyChart account, I can also send a copy of this consent to you electronically.  All virtual visits are billed to your insurance company just like a traditional visit in the office.  As this is a virtual visit, video technology does not allow for your provider to perform a traditional examination.  This may limit your provider's ability to fully assess your condition.  If your provider identifies any concerns that need to be evaluated in person or the need to arrange testing such as labs, EKG, etc, we will make arrangements to do so.    Although advances in technology are sophisticated, we cannot ensure that it will always work on either your end or our end.  If the connection with a video visit is poor, we may have to switch to a telephone visit.  With either a video or telephone visit, we are not always able to ensure that we have a secure connection.   I need to obtain your verbal consent now.   Are you willing to proceed with your visit today?   Tanya Tucker has provided verbal consent on 10/18/2019 for a virtual visit (video or telephone).   Melony Overly, PA-C 10/18/2019  8:34 PM

## 2019-10-25 NOTE — Patient Instructions (Signed)
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
## 2019-10-26 ENCOUNTER — Ambulatory Visit: Payer: BC Managed Care – PPO | Admitting: Internal Medicine

## 2019-10-26 ENCOUNTER — Encounter: Payer: Self-pay | Admitting: Internal Medicine

## 2019-10-26 ENCOUNTER — Other Ambulatory Visit: Payer: Self-pay

## 2019-10-26 VITALS — BP 118/76 | HR 64 | Temp 97.6°F | Resp 16 | Ht 67.0 in | Wt 180.4 lb

## 2019-10-26 DIAGNOSIS — Z1389 Encounter for screening for other disorder: Secondary | ICD-10-CM

## 2019-10-26 DIAGNOSIS — Z111 Encounter for screening for respiratory tuberculosis: Secondary | ICD-10-CM

## 2019-10-26 DIAGNOSIS — R5383 Other fatigue: Secondary | ICD-10-CM

## 2019-10-26 DIAGNOSIS — I10 Essential (primary) hypertension: Secondary | ICD-10-CM

## 2019-10-26 DIAGNOSIS — Z8249 Family history of ischemic heart disease and other diseases of the circulatory system: Secondary | ICD-10-CM

## 2019-10-26 DIAGNOSIS — Z1329 Encounter for screening for other suspected endocrine disorder: Secondary | ICD-10-CM

## 2019-10-26 DIAGNOSIS — Z79899 Other long term (current) drug therapy: Secondary | ICD-10-CM | POA: Diagnosis not present

## 2019-10-26 DIAGNOSIS — F319 Bipolar disorder, unspecified: Secondary | ICD-10-CM

## 2019-10-26 DIAGNOSIS — E782 Mixed hyperlipidemia: Secondary | ICD-10-CM

## 2019-10-26 DIAGNOSIS — E559 Vitamin D deficiency, unspecified: Secondary | ICD-10-CM

## 2019-10-26 DIAGNOSIS — Z130 Encounter for screening for diseases of the blood and blood-forming organs and certain disorders involving the immune mechanism: Secondary | ICD-10-CM

## 2019-10-26 DIAGNOSIS — Z136 Encounter for screening for cardiovascular disorders: Secondary | ICD-10-CM

## 2019-10-26 DIAGNOSIS — Z1211 Encounter for screening for malignant neoplasm of colon: Secondary | ICD-10-CM

## 2019-10-26 DIAGNOSIS — Z131 Encounter for screening for diabetes mellitus: Secondary | ICD-10-CM | POA: Diagnosis not present

## 2019-10-26 DIAGNOSIS — Z13220 Encounter for screening for lipoid disorders: Secondary | ICD-10-CM | POA: Diagnosis not present

## 2019-10-26 DIAGNOSIS — R7309 Other abnormal glucose: Secondary | ICD-10-CM

## 2019-10-26 DIAGNOSIS — E8881 Metabolic syndrome: Secondary | ICD-10-CM

## 2019-10-26 DIAGNOSIS — Z0000 Encounter for general adult medical examination without abnormal findings: Secondary | ICD-10-CM | POA: Diagnosis not present

## 2019-10-26 DIAGNOSIS — Z0001 Encounter for general adult medical examination with abnormal findings: Secondary | ICD-10-CM

## 2019-10-26 DIAGNOSIS — Z87891 Personal history of nicotine dependence: Secondary | ICD-10-CM

## 2019-10-28 ENCOUNTER — Other Ambulatory Visit: Payer: Self-pay | Admitting: Internal Medicine

## 2019-10-28 NOTE — Progress Notes (Signed)
========================================================== -   Test results slightly outside the reference range are not unusual. If there is anything important, I will review this with you,  otherwise it is considered normal test values.  If you have further questions,  please do not hesitate to contact me at the office or via My Chart.  ==========================================================  -  Lithium Level returned in therapeutic range   - Results forwarded to [facility] ==========================================================  -  Calcium level is persistently mildly elevated - sometimes this is a result of  Medications like the hydrochlorothiazide in your BP med   Or sometimes may be due to Lithium  - So, please call office to schedule a lab visit to check a blood test called  PTH which is a hormone which regulate calcium balance in the body. ==========================================================  -  Vitamin D = 75 - is at a good level, but recommend stop for now until determine the cause of the elevated Calcium ==========================================================  -  Total Chol = 140 and LDL Chol = 69 - Both  Excellent   - Very low risk for Heart Attack  / Stroke =============================================================  - Triglycerides (  208  ) or fats in blood are too high  (goal is less than 150)    - Recommend avoid fried & greasy foods,  sweets / candy,   - Avoid white rice  (brown or wild rice or Quinoa is OK),   - Avoid white potatoes  (sweet potatoes are OK)   - Avoid anything made from white flour  - bagels, doughnuts, rolls, buns, biscuits, white and   wheat breads, pizza crust and traditional  pasta made of white flour & egg white  - (vegetarian pasta or spinach or wheat pasta is OK).    - Multi-grain bread is OK - like multi-grain flat bread or  sandwich thins.   - Avoid alcohol in excess.   - Exercise is also  important. ==========================================================  -  A1c   is Normal  - Great - No Diabetes ==========================================================  -  Iron & Vitamin B12 levels - Both Normal & OK  ==========================================================  -  All Else - CBC - Kidneys - Electrolytes - Liver - Magnesium & Thyroid    - all  Normal / OK ==========================================================

## 2019-10-29 LAB — URINALYSIS, ROUTINE W REFLEX MICROSCOPIC
Bilirubin Urine: NEGATIVE
Glucose, UA: NEGATIVE
Hgb urine dipstick: NEGATIVE
Ketones, ur: NEGATIVE
Leukocytes,Ua: NEGATIVE
Nitrite: NEGATIVE
Protein, ur: NEGATIVE
Specific Gravity, Urine: 1.013 (ref 1.001–1.03)
pH: 8 (ref 5.0–8.0)

## 2019-10-29 LAB — LIPID PANEL
Cholesterol: 140 mg/dL (ref <200)
HDL: 44 mg/dL — ABNORMAL LOW (ref >=50)
LDL Cholesterol (Calc): 69 mg/dL (calc)
Non-HDL Cholesterol (Calc): 96 mg/dL (calc) (ref <130)
Total CHOL/HDL Ratio: 3.2 (calc) (ref <5.0)
Triglycerides: 208 mg/dL — ABNORMAL HIGH (ref <150)

## 2019-10-29 LAB — CBC WITH DIFFERENTIAL/PLATELET
Absolute Monocytes: 318 cells/uL (ref 200–950)
Basophils Absolute: 58 cells/uL (ref 0–200)
Basophils Relative: 1.1 %
Eosinophils Absolute: 350 cells/uL (ref 15–500)
Eosinophils Relative: 6.6 %
HCT: 38.5 % (ref 35.0–45.0)
Hemoglobin: 12.5 g/dL (ref 11.7–15.5)
Lymphs Abs: 1595 cells/uL (ref 850–3900)
MCH: 29.1 pg (ref 27.0–33.0)
MCHC: 32.5 g/dL (ref 32.0–36.0)
MCV: 89.7 fL (ref 80.0–100.0)
MPV: 10.8 fL (ref 7.5–12.5)
Monocytes Relative: 6 %
Neutro Abs: 2979 cells/uL (ref 1500–7800)
Neutrophils Relative %: 56.2 %
Platelets: 199 10*3/uL (ref 140–400)
RBC: 4.29 10*6/uL (ref 3.80–5.10)
RDW: 13.1 % (ref 11.0–15.0)
Total Lymphocyte: 30.1 %
WBC: 5.3 10*3/uL (ref 3.8–10.8)

## 2019-10-29 LAB — COMPLETE METABOLIC PANEL WITH GFR
AG Ratio: 2.1 (calc) (ref 1.0–2.5)
ALT: 22 U/L (ref 6–29)
AST: 21 U/L (ref 10–35)
Albumin: 4.7 g/dL (ref 3.6–5.1)
Alkaline phosphatase (APISO): 56 U/L (ref 37–153)
BUN: 20 mg/dL (ref 7–25)
CO2: 30 mmol/L (ref 20–32)
Calcium: 10.8 mg/dL — ABNORMAL HIGH (ref 8.6–10.4)
Chloride: 104 mmol/L (ref 98–110)
Creat: 0.89 mg/dL (ref 0.50–1.05)
GFR, Est African American: 84 mL/min/{1.73_m2} (ref >=60)
GFR, Est Non African American: 72 mL/min/{1.73_m2} (ref >=60)
Globulin: 2.2 g/dL (calc) (ref 1.9–3.7)
Glucose, Bld: 89 mg/dL (ref 65–99)
Potassium: 4.8 mmol/L (ref 3.5–5.3)
Sodium: 139 mmol/L (ref 135–146)
Total Bilirubin: 0.7 mg/dL (ref 0.2–1.2)
Total Protein: 6.9 g/dL (ref 6.1–8.1)

## 2019-10-29 LAB — HEMOGLOBIN A1C
Hgb A1c MFr Bld: 5.1 % of total Hgb (ref <5.7)
Mean Plasma Glucose: 100 (calc)
eAG (mmol/L): 5.5 (calc)

## 2019-10-29 LAB — TSH: TSH: 1.99 mIU/L (ref 0.40–4.50)

## 2019-10-29 LAB — LITHIUM LEVEL: Lithium Lvl: 0.9 mmol/L (ref 0.6–1.2)

## 2019-10-29 LAB — MICROALBUMIN / CREATININE URINE RATIO
Creatinine, Urine: 63 mg/dL (ref 20–275)
Microalb Creat Ratio: 8 mcg/mg creat (ref <30)
Microalb, Ur: 0.5 mg/dL

## 2019-10-29 LAB — IRON, TOTAL/TOTAL IRON BINDING CAP
%SAT: 19 % (calc) (ref 16–45)
Iron: 70 ug/dL (ref 45–160)
TIBC: 370 mcg/dL (calc) (ref 250–450)

## 2019-10-29 LAB — MAGNESIUM: Magnesium: 2.1 mg/dL (ref 1.5–2.5)

## 2019-10-29 LAB — INSULIN, RANDOM: Insulin: 10.9 u[IU]/mL

## 2019-10-29 LAB — VITAMIN B12: Vitamin B-12: 1038 pg/mL (ref 200–1100)

## 2019-10-29 LAB — VITAMIN D 25 HYDROXY (VIT D DEFICIENCY, FRACTURES): Vit D, 25-Hydroxy: 75 ng/mL (ref 30–100)

## 2019-10-29 NOTE — Progress Notes (Signed)
Labs reviewed.  Lithium level 0.9, TSH 1.99, BUN 20, creatinine 0.8, calcium 10.8 with upper limit of normal is 10.4.  I reviewed Dr. Kathryne Sharper notes.  As noted, the lithium can cause an increased calcium, but because there can be other causes for hypercalcemia, he has ordered a PTH.  I appreciate him sending the lab results to me and the thorough work-up.

## 2019-11-20 ENCOUNTER — Encounter: Payer: BLUE CROSS/BLUE SHIELD | Admitting: Internal Medicine

## 2019-11-21 ENCOUNTER — Other Ambulatory Visit: Payer: Self-pay

## 2019-11-21 DIAGNOSIS — Z1211 Encounter for screening for malignant neoplasm of colon: Secondary | ICD-10-CM | POA: Diagnosis not present

## 2019-11-21 DIAGNOSIS — Z1212 Encounter for screening for malignant neoplasm of rectum: Secondary | ICD-10-CM | POA: Diagnosis not present

## 2019-11-21 LAB — POC HEMOCCULT BLD/STL (HOME/3-CARD/SCREEN)
Card #2 Fecal Occult Blod, POC: NEGATIVE
Card #3 Fecal Occult Blood, POC: NEGATIVE
Fecal Occult Blood, POC: NEGATIVE

## 2019-11-27 ENCOUNTER — Ambulatory Visit: Payer: BC Managed Care – PPO | Admitting: Adult Health Nurse Practitioner

## 2019-12-10 ENCOUNTER — Other Ambulatory Visit: Payer: Self-pay | Admitting: Adult Health

## 2019-12-10 DIAGNOSIS — E782 Mixed hyperlipidemia: Secondary | ICD-10-CM

## 2019-12-24 ENCOUNTER — Encounter: Payer: BC Managed Care – PPO | Admitting: Obstetrics & Gynecology

## 2019-12-24 DIAGNOSIS — Z0289 Encounter for other administrative examinations: Secondary | ICD-10-CM

## 2020-01-01 DIAGNOSIS — H524 Presbyopia: Secondary | ICD-10-CM | POA: Diagnosis not present

## 2020-01-01 DIAGNOSIS — H52203 Unspecified astigmatism, bilateral: Secondary | ICD-10-CM | POA: Diagnosis not present

## 2020-01-01 DIAGNOSIS — H25813 Combined forms of age-related cataract, bilateral: Secondary | ICD-10-CM | POA: Diagnosis not present

## 2020-01-01 DIAGNOSIS — H5213 Myopia, bilateral: Secondary | ICD-10-CM | POA: Diagnosis not present

## 2020-02-07 ENCOUNTER — Other Ambulatory Visit: Payer: Self-pay

## 2020-02-07 ENCOUNTER — Ambulatory Visit: Payer: BC Managed Care – PPO | Admitting: Adult Health

## 2020-02-07 ENCOUNTER — Encounter: Payer: Self-pay | Admitting: Adult Health

## 2020-02-07 VITALS — BP 120/82 | HR 77 | Temp 96.3°F | Wt 182.0 lb

## 2020-02-07 DIAGNOSIS — I10 Essential (primary) hypertension: Secondary | ICD-10-CM | POA: Diagnosis not present

## 2020-02-07 DIAGNOSIS — E8881 Metabolic syndrome: Secondary | ICD-10-CM | POA: Diagnosis not present

## 2020-02-07 DIAGNOSIS — F3163 Bipolar disorder, current episode mixed, severe, without psychotic features: Secondary | ICD-10-CM

## 2020-02-07 DIAGNOSIS — E782 Mixed hyperlipidemia: Secondary | ICD-10-CM | POA: Diagnosis not present

## 2020-02-07 DIAGNOSIS — E559 Vitamin D deficiency, unspecified: Secondary | ICD-10-CM

## 2020-02-07 DIAGNOSIS — Z79899 Other long term (current) drug therapy: Secondary | ICD-10-CM | POA: Diagnosis not present

## 2020-02-07 DIAGNOSIS — E663 Overweight: Secondary | ICD-10-CM

## 2020-02-07 DIAGNOSIS — G4700 Insomnia, unspecified: Secondary | ICD-10-CM

## 2020-02-07 MED ORDER — LISINOPRIL 10 MG PO TABS: ORAL_TABLET | ORAL | 1 refills | Status: DC

## 2020-02-07 MED ORDER — ROSUVASTATIN CALCIUM 20 MG PO TABS
ORAL_TABLET | ORAL | 1 refills | Status: DC
Start: 2020-02-07 — End: 2021-07-24
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
## 2020-02-08 LAB — COMPLETE METABOLIC PANEL WITH GFR
AG Ratio: 2.1 (calc) (ref 1.0–2.5)
ALT: 21 U/L (ref 6–29)
AST: 21 U/L (ref 10–35)
Albumin: 4.6 g/dL (ref 3.6–5.1)
Alkaline phosphatase (APISO): 59 U/L (ref 37–153)
BUN/Creatinine Ratio: 32 (calc) — ABNORMAL HIGH (ref 6–22)
BUN: 28 mg/dL — ABNORMAL HIGH (ref 7–25)
CO2: 31 mmol/L (ref 20–32)
Calcium: 10.5 mg/dL — ABNORMAL HIGH (ref 8.6–10.4)
Chloride: 104 mmol/L (ref 98–110)
Creat: 0.88 mg/dL (ref 0.50–1.05)
GFR, Est African American: 85 mL/min/{1.73_m2} (ref >=60)
GFR, Est Non African American: 73 mL/min/{1.73_m2} (ref >=60)
Globulin: 2.2 g/dL (calc) (ref 1.9–3.7)
Glucose, Bld: 114 mg/dL — ABNORMAL HIGH (ref 65–99)
Potassium: 4.7 mmol/L (ref 3.5–5.3)
Sodium: 140 mmol/L (ref 135–146)
Total Bilirubin: 0.6 mg/dL (ref 0.2–1.2)
Total Protein: 6.8 g/dL (ref 6.1–8.1)

## 2020-02-08 LAB — CBC WITH DIFFERENTIAL/PLATELET
Absolute Monocytes: 455 cells/uL (ref 200–950)
Basophils Absolute: 59 cells/uL (ref 0–200)
Basophils Relative: 0.9 %
Eosinophils Absolute: 429 cells/uL (ref 15–500)
Eosinophils Relative: 6.5 %
HCT: 38.7 % (ref 35.0–45.0)
Hemoglobin: 13.1 g/dL (ref 11.7–15.5)
Lymphs Abs: 1914 cells/uL (ref 850–3900)
MCH: 29.7 pg (ref 27.0–33.0)
MCHC: 33.9 g/dL (ref 32.0–36.0)
MCV: 87.8 fL (ref 80.0–100.0)
MPV: 10.7 fL (ref 7.5–12.5)
Monocytes Relative: 6.9 %
Neutro Abs: 3742 cells/uL (ref 1500–7800)
Neutrophils Relative %: 56.7 %
Platelets: 201 10*3/uL (ref 140–400)
RBC: 4.41 10*6/uL (ref 3.80–5.10)
RDW: 12.8 % (ref 11.0–15.0)
Total Lymphocyte: 29 %
WBC: 6.6 10*3/uL (ref 3.8–10.8)

## 2020-02-08 LAB — LIPID PANEL
Cholesterol: 173 mg/dL (ref <200)
HDL: 48 mg/dL — ABNORMAL LOW (ref >=50)
LDL Cholesterol (Calc): 89 mg/dL (calc)
Non-HDL Cholesterol (Calc): 125 mg/dL (calc) (ref <130)
Total CHOL/HDL Ratio: 3.6 (calc) (ref <5.0)
Triglycerides: 270 mg/dL — ABNORMAL HIGH (ref <150)

## 2020-02-08 LAB — TSH: TSH: 1.98 mIU/L (ref 0.40–4.50)

## 2020-02-08 LAB — MAGNESIUM: Magnesium: 2.2 mg/dL (ref 1.5–2.5)

## 2020-02-27 ENCOUNTER — Encounter: Payer: BC Managed Care – PPO | Admitting: Obstetrics & Gynecology

## 2020-03-31 ENCOUNTER — Encounter: Payer: Self-pay | Admitting: Internal Medicine

## 2020-03-31 ENCOUNTER — Ambulatory Visit: Payer: BC Managed Care – PPO | Admitting: Internal Medicine

## 2020-03-31 ENCOUNTER — Other Ambulatory Visit: Payer: Self-pay

## 2020-03-31 VITALS — BP 102/74 | HR 57 | Temp 97.2°F | Resp 16 | Ht 67.5 in | Wt 181.6 lb

## 2020-03-31 DIAGNOSIS — L255 Unspecified contact dermatitis due to plants, except food: Secondary | ICD-10-CM | POA: Diagnosis not present

## 2020-03-31 MED ORDER — DEXAMETHASONE 2 MG PO TABS
ORAL_TABLET | ORAL | 0 refills | Status: DC
Start: 2020-03-31 — End: 2020-04-16

## 2020-03-31 NOTE — Progress Notes (Signed)
   History of Present Illness:         This nice 60 yo MWF presents with a blistering rash of hands & forearms after exposure to "weeds" in her daughter's yard.      Medications  .  lisinopril (ZESTRIL) 10 MG tablet, Take 1 tablet daily for BP .  rosuvastatin (CRESTOR) 20 MG tablet, Take 1 tablet Daily for Cholesterol .  BABY ASPIRIN PO, Take 81 mg by mouth 2 (two) times daily.  .  calcium carbonate (TUMS - DOSED IN MG ELEMENTAL CALCIUM) 500 MG chewable tablet, Chew 1 tablet by mouth daily. Marland Kitchen  VITAMIN D , Take 10,000 Units  daily. Marland Kitchen  co-enzyme Q-10 30 MG capsule, Take 30 mg by mouth daily.  Marland Kitchen  lithium carbonate (LITHOBID) 300 MG CR tablet, Take 1 tablet every Morning & 2 tablets at Bedtime .  Magnesium 250 MG TABS, Take 250 mg by mouth 2 (two) times daily. .  Multiple Vitamin (MULTIVITAMIN WITH MINERALS) TABS tablet, Take 1 tablet by mouth daily. .  Omega-3 Fatty Acids (FISH OIL) 1000 MG CAPS, Take 3,000 mg by mouth. .  QUEtiapine (SEROQUEL) 50 MG tablet, Take 1 tablet (50 mg total) by mouth at bedtime as needed.  Problem list She has Hypertension; Vitamin D deficiency; Insulin resistance; Mixed hyperlipidemia; Medication management; Overweight (BMI 25.0-29.9); Bipolar 1 disorder, mixed, severe (HCC); Former smoker; FHx: heart disease; and Insomnia on their problem list.   Observations/Objective:   BP 102/74   P 57   T 97.2 F   R 16   Ht 5' 7.5"    Wt 181 lb 9.6 oz   SpO2 99%   BMI 28.02  Skin focused exam finds a classic contact dermatitis type rash with scattere "raw" patches  of dorsal hands , wrists, forearms.   Assessment and Plan:  1. Dermatitis due to plants, including poison ivy, sumac, and oak  - dexamethasone  2 MG tablet; Take 1 tab 3 x day for 3 days, then 2 x day for 3 days, then 1 tab Daily x 5 days, then 1 tablet every other day  Dispense: 30 tablet;    Follow Up Instructions:    I discussed the assessment and treatment plan with the patient. The  patient was provided an opportunity to ask questions and all were answered. The patient agreed with the plan and demonstrated an understanding of the instructions.   The patient was advised to call back or seek an in-person evaluation if the symptoms worsen or if the condition fails to improve as anticipated.    Marinus Maw, MD

## 2020-03-31 NOTE — Patient Instructions (Signed)
Poison Ivy Dermatitis Poison ivy dermatitis is inflammation of the skin that is caused by chemicals in the leaves of the poison ivy plant. The skin reaction often involves redness, swelling, blisters, and extreme itching. What are the causes? This condition is caused by a chemical (urushiol) found in the sap of the poison ivy plant. This chemical is sticky and can be easily spread to people, animals, and objects. You can get poison ivy dermatitis by:  Having direct contact with a poison ivy plant.  Touching animals, other people, or objects that have come in contact with poison ivy and have the chemical on them. What increases the risk? This condition is more likely to develop in people who:  Are outdoors often in wooded or marshy areas.  Go outdoors without wearing protective clothing, such as closed shoes, long pants, and a long-sleeved shirt. What are the signs or symptoms? Symptoms of this condition include:  Redness of the skin.  Extreme itching.  A rash that often includes bumps and blisters. The rash usually appears 48 hours after exposure, if you have been exposed before. If this is the first time you have been exposed, the rash may not appear until a week after exposure.  Swelling. This may occur if the reaction is more severe. Symptoms usually last for 1-2 weeks. However, the first time you develop this condition, symptoms may last 3-4 weeks.   How is this diagnosed? This condition may be diagnosed based on your symptoms and a physical exam. Your health care provider may also ask you about any recent outdoor activity. How is this treated? Treatment for this condition will vary depending on how severe it is. Treatment may include:  Hydrocortisone cream or calamine lotion to relieve itching.  Oatmeal baths to soothe the skin.  Medicines, such as over-the-counter antihistamine tablets.  Oral steroid medicine, for more severe reactions. Follow these instructions at  home: Medicines  Take or apply over-the-counter and prescription medicines only as told by your health care provider.  Use hydrocortisone cream or calamine lotion as needed to soothe the skin and relieve itching. General instructions  Do not scratch or rub your skin.  Apply a cold, wet cloth (cold compress) to the affected areas or take baths in cool water. This will help with itching. Avoid hot baths and showers.  Take oatmeal baths as needed. Use colloidal oatmeal. You can get this at your local pharmacy or grocery store. Follow the instructions on the packaging.  While you have the rash, wash clothes right after you wear them.  Keep all follow-up visits as told by your health care provider. This is important. How is this prevented?  Learn to identify the poison ivy plant and avoid contact with the plant. This plant can be recognized by the number of leaves. Generally, poison ivy has three leaves with flowering branches on a single stem. The leaves are typically glossy, and they have jagged edges that come to a point at the front.  If you have been exposed to poison ivy, thoroughly wash with soap and water right away. You have about 30 minutes to remove the plant resin before it will cause the rash. Be sure to wash under your fingernails, because any plant resin there will continue to spread the rash.  When hiking or camping, wear clothes that will help you to avoid exposure on the skin. This includes long pants, a long-sleeved shirt, tall socks, and hiking boots. You can also apply preventive lotion to your skin   to help limit exposure.  If you suspect that your clothes or outdoor gear came in contact with poison ivy, rinse them off outside with a garden hose before you bring them inside your house.  When doing yard work or gardening, wear gloves, long sleeves, long pants, and boots. Wash your garden tools and gloves if they come in contact with poison ivy.  If you suspect that your  pet has come into contact with poison ivy, wash him or her with pet shampoo and water. Make sure to wear gloves while washing your pet.   Contact a health care provider if you have:  Open sores in the rash area.  More redness, swelling, or pain in the affected area.  Redness that spreads beyond the rash area.  Fluid, blood, or pus coming from the affected area.  A fever.  A rash over a large area of your body.  A rash on your eyes, mouth, or genitals.  A rash that does not improve after a few weeks. Get help right away if:  Your face swells or your eyes swell shut.  You have trouble breathing.  You have trouble swallowing. These symptoms may represent a serious problem that is an emergency. Do not wait to see if the symptoms will go away. Get medical help right away. Call your local emergency services (911 in the [place] Do not drive yourself to the hospital. Summary  Poison ivy dermatitis is inflammation of the skin that is caused by chemicals in the leaves of the poison ivy plant.  Symptoms of this condition include redness, itching, a rash, and swelling.  Do not scratch or rub your skin.  Take or apply over-the-counter and prescription medicines only as told by your health care provider. This information is not intended to replace advice given to you by your health care provider. Make sure you discuss any questions you have with your health care provider. Document Revised: 05/12/2018 Document Reviewed: 01/13/2018 Elsevier Patient Education  2021 [employer] Inc.  

## 2020-04-08 ENCOUNTER — Telehealth: Payer: Self-pay | Admitting: *Deleted

## 2020-04-08 NOTE — Telephone Encounter (Signed)
Returned call to patient regarding problems since starting Dexamethasone. Patient complained of insomnia, and lower abdominal pain. Per Dr Oneta Rack, ok to stop the medication.

## 2020-04-16 ENCOUNTER — Encounter: Payer: Self-pay | Admitting: Obstetrics & Gynecology

## 2020-04-16 ENCOUNTER — Other Ambulatory Visit: Payer: Self-pay

## 2020-04-16 ENCOUNTER — Ambulatory Visit: Payer: BC Managed Care – PPO | Admitting: Obstetrics & Gynecology

## 2020-04-16 VITALS — BP 130/80 | Ht 66.25 in | Wt 179.0 lb

## 2020-04-16 DIAGNOSIS — Z780 Asymptomatic menopausal state: Secondary | ICD-10-CM

## 2020-04-16 DIAGNOSIS — Z01419 Encounter for gynecological examination (general) (routine) without abnormal findings: Secondary | ICD-10-CM

## 2020-04-16 NOTE — Progress Notes (Signed)
Tanya Tucker 1960/10/11 [id number]   History:    60 y.o. G2P2L2 Married.  Son and daughter doing well.  RP:  Established patient presenting for annual gyn exam   HPI: Postmenopause, well on no HRT.  No PMB.  No pelvic pain.  No pain with intercourse.  Urine and bowel movements normal.  Breasts normal.  Scheduled a screening mammo.  Patient had alopecia and is satisfied with natural hair glued to her scalp.  Body mass index 28.67.  Walking regularly and lifting small weights. Health labs with family physician. BD normal in 08/2017.  Past medical history,surgical history, family history and social history were all reviewed and documented in the EPIC chart.  Gynecologic History Patient's last menstrual period was 04/02/2015 (approximate).  Obstetric History OB History  Gravida Para Term Preterm AB Living  2 2       2   SAB IAB Ectopic Multiple Live Births               # Outcome Date GA Lbr Len/2nd Weight Sex Delivery Anes PTL Lv  2 Para           1 Para              ROS: A ROS was performed and pertinent positives and negatives are included in the history.  GENERAL: No fevers or chills. HEENT: No change in vision, no earache, sore throat or sinus congestion. NECK: No pain or stiffness. CARDIOVASCULAR: No chest pain or pressure. No palpitations. PULMONARY: No shortness of breath, cough or wheeze. GASTROINTESTINAL: No abdominal pain, nausea, vomiting or diarrhea, melena or bright red blood per rectum. GENITOURINARY: No urinary frequency, urgency, hesitancy or dysuria. MUSCULOSKELETAL: No joint or muscle pain, no back pain, no recent trauma. DERMATOLOGIC: No rash, no itching, no lesions. ENDOCRINE: No polyuria, polydipsia, no heat or cold intolerance. No recent change in weight. HEMATOLOGICAL: No anemia or easy bruising or bleeding. NEUROLOGIC: No headache, seizures, numbness, tingling or weakness. PSYCHIATRIC: No depression, no loss of interest in normal activity or change in sleep  pattern.     Exam:   BP 130/80   Ht 5' 6.25" (1.683 m)   Wt 179 lb (81.2 kg)   LMP 04/02/2015 (Approximate)   BMI 28.67 kg/m   Body mass index is 28.67 kg/m.  General appearance : Well developed well nourished female. No acute distress HEENT: Eyes: no retinal hemorrhage or exudates,  Neck supple, trachea midline, no carotid bruits, no thyroidmegaly Lungs: Clear to auscultation, no rhonchi or wheezes, or rib retractions  Heart: Regular rate and rhythm, no murmurs or gallops Breast:Examined in sitting and supine position were symmetrical in appearance, no palpable masses or tenderness,  no skin retraction, no nipple inversion, no nipple discharge, no skin discoloration, no axillary or supraclavicular lymphadenopathy Abdomen: no palpable masses or tenderness, no rebound or guarding Extremities: no edema or skin discoloration or tenderness  Pelvic: Vulva: Normal             Vagina: No gross lesions or discharge  Cervix: No gross lesions or discharge.  Pap reflex done.  Uterus  AV, normal size, shape and consistency, non-tender and mobile  Adnexa  Without masses or tenderness  Anus: Normal   Assessment/Plan:  60 y.o. female for annual exam   1. Encounter for routine gynecological examination with Papanicolaou smear of cervix Normal gynecologic exam in menopause.  Pap reflex done.  Breast exam normal.  Overdue for a screening mammogram, will schedule.  Colonoscopy 2019.  Body mass index 28.67.  Recommend a slightly lower calorie/carb diet.  Aerobic activities 5 times a week and light weightlifting every 2 days.  Health labs with family physician.  2. Postmenopause Well on no hormone replacement therapy.  No postmenopausal bleeding.  Other orders - zinc gluconate 50 MG tablet; Take 50 mg by mouth daily.  Genia Del MD, 3:58 PM 04/16/2020

## 2020-04-17 LAB — PAP IG W/ RFLX HPV ASCU

## 2020-04-22 ENCOUNTER — Encounter: Payer: Self-pay | Admitting: Internal Medicine

## 2020-04-22 NOTE — Progress Notes (Signed)
° ° ° °  History of Present Illness:     This very nice 60 yo MWF with HTN, HLD, Pre DM, & Vit D Deficiency presents with c/o numbness of her Rt great toe. Also has concerns re: fluid retention after removing HCTZ from her BP meds.   Medications    lisinopril (ZESTRIL) 10 MG tablet, Take 1 tablet daily for BP    rosuvastatin (CRESTOR) 20 MG tablet, Take 1 tablet Daily for Cholesterol    BABY ASPIRIN PO, Take  2 times daily.     TUMS -  500 MG chewable tablet, Chew 1 tablet  Daily.    VITAMIN D, Take 10,000 Units  Daily.    co-enzyme Q-10 30 MG, Take  daily.     LITHOBID 300 MG CR , Take 1 tablet every Morning & 2 tablets at Bedtime    Magnesium 250 MG TABS, Take  2 times daily    Multiple Vitamin , Take 1 tablet daily.    Omega-3 FISH OIL 1000 MG , Take 3,000 mg daily    QUEtiapine 50 MG , Take 1 tablet  at bedtime as needed.    zinc  MG tablet, Take  daily.  Problem list She has Hypertension; Vitamin D deficiency; Insulin resistance; Mixed hyperlipidemia; Medication management; Overweight (BMI 25.0-29.9); Bipolar 1 disorder, mixed, severe (HCC); Former smoker; FHx: heart disease; and Insomnia on their problem list.   Observations/Objective:   BP 110/80    Pulse (!) 51    Temp 97.7 F (36.5 C)    Resp 16    Ht 5' 7.5" (1.715 m)    Wt 179 lb 12.8 oz (81.6 kg)    LMP 04/02/2015 (Approximate)    SpO2 99%    BMI 27.75 kg/m   HEENT - WNL. Neck - supple.  Chest - Clear equal BS. Cor - Nl HS. RRR w/o sig MGR. PP (2+/2+). No edema. MS- FROM w/o deformities.  Gait Nl.  Apparent foot and toes wider then shoe girth .  Neuro -  Nl w/o focal abnormalities.  Assessment and Plan:   1. Paresthesia of right foot (great toe)   - Suspect compression. Recommend purchase wider shoes.    Follow Up Instructions:     New Rx for mild diuretic HCTZ 12.5 mg cap to use occasionally prn fluid retention     I discussed the assessment and treatment plan with the patient. The  patient was provided an opportunity to ask questions and all were answered. The patient agreed with the plan and demonstrated an understanding of the instructions.      The patient was advised to call back or seek an in-person evaluation if the symptoms worsen or if the condition fails to improve as anticipated.   Marinus Maw, MD

## 2020-04-23 ENCOUNTER — Other Ambulatory Visit: Payer: Self-pay | Admitting: Internal Medicine

## 2020-04-23 ENCOUNTER — Other Ambulatory Visit: Payer: Self-pay

## 2020-04-23 ENCOUNTER — Ambulatory Visit: Payer: BC Managed Care – PPO | Admitting: Internal Medicine

## 2020-04-23 VITALS — BP 110/80 | HR 51 | Temp 97.7°F | Resp 16 | Ht 67.5 in | Wt 179.8 lb

## 2020-04-23 DIAGNOSIS — R202 Paresthesia of skin: Secondary | ICD-10-CM

## 2020-04-23 MED ORDER — HYDROCHLOROTHIAZIDE 12.5 MG PO CAPS: 12.5000 mg | ORAL_CAPSULE | Freq: Every day | ORAL | 0 refills | Status: DC

## 2020-04-24 ENCOUNTER — Encounter: Payer: Self-pay | Admitting: Obstetrics & Gynecology

## 2020-04-25 ENCOUNTER — Other Ambulatory Visit: Payer: Self-pay | Admitting: Internal Medicine

## 2020-05-12 ENCOUNTER — Ambulatory Visit: Payer: BC Managed Care – PPO | Admitting: Internal Medicine

## 2020-07-05 ENCOUNTER — Other Ambulatory Visit: Payer: Self-pay | Admitting: Physician Assistant

## 2020-07-05 DIAGNOSIS — F319 Bipolar disorder, unspecified: Secondary | ICD-10-CM

## 2020-07-08 NOTE — Telephone Encounter (Signed)
Last apt 10/2019 overdue for 6 month

## 2020-07-08 NOTE — Telephone Encounter (Signed)
Next visit is 08/18/20

## 2020-07-08 NOTE — Telephone Encounter (Signed)
Please schedule appt

## 2020-08-13 ENCOUNTER — Ambulatory Visit: Payer: BC Managed Care – PPO | Admitting: Nurse Practitioner
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
## 2020-08-14 ENCOUNTER — Other Ambulatory Visit: Payer: Self-pay

## 2020-08-14 ENCOUNTER — Ambulatory Visit: Payer: BC Managed Care – PPO | Admitting: Nurse Practitioner

## 2020-08-14 ENCOUNTER — Encounter: Payer: Self-pay | Admitting: Nurse Practitioner

## 2020-08-14 VITALS — BP 102/70 | HR 75 | Temp 94.8°F | Wt 174.0 lb

## 2020-08-14 DIAGNOSIS — Z79899 Other long term (current) drug therapy: Secondary | ICD-10-CM

## 2020-08-14 DIAGNOSIS — R1032 Left lower quadrant pain: Secondary | ICD-10-CM | POA: Diagnosis not present

## 2020-08-14 DIAGNOSIS — J069 Acute upper respiratory infection, unspecified: Secondary | ICD-10-CM | POA: Diagnosis not present

## 2020-08-14 DIAGNOSIS — T148XXA Other injury of unspecified body region, initial encounter: Secondary | ICD-10-CM

## 2020-08-14 DIAGNOSIS — E559 Vitamin D deficiency, unspecified: Secondary | ICD-10-CM

## 2020-08-14 NOTE — Patient Instructions (Signed)
Muscle Strain A muscle strain is an injury that occurs when a muscle is stretched beyond its normal length. Usually, a small number of muscle fibers are torn when this happens. There are three types of muscle strains. First-degree strains have the least amount of muscle fiber tearing and the least amount of pain.Second-degree and third-degree strains have more tearing and pain. Usually, recovery from muscle strain takes 1-2 weeks. Complete healing normallytakes 5-6 weeks. What are the causes? This condition is caused when a sudden, violent force is placed on a muscle andstretches it too far. This may occur with a fall, lifting, or sports. What increases the risk? This condition is more likely to develop in athletes and people who arephysically active. What are the signs or symptoms? Symptoms of this condition include: Pain. Bruising. Swelling. Trouble using the muscle. How is this diagnosed? This condition is diagnosed based on a physical exam and your medical history.Tests may also be done, including an X-ray, ultrasound, or MRI. How is this treated? This condition is initially treated with PRICE therapy. This therapy involves: Protecting the muscle from being injured again. Resting the injured muscle. Icing the injured muscle. Applying pressure (compression) to the injured muscle. This may be done with a splint or elastic bandage. Raising (elevating) the injured muscle. Your health care provider may also recommend medicine for pain. Follow these instructions at home: If you have a splint: Wear the splint as told by your health care provider. Remove it only as told by your health care provider. Loosen the splint if your fingers or toes tingle, become numb, or turn cold and blue. Keep the splint clean. If the splint is not waterproof: Do not let it get wet. Cover it with a watertight covering when you take a bath or a shower. Managing pain, stiffness, and swelling  If directed, put  ice on the injured area: If you have a removable splint, remove it as told by your health care provider. Put ice in a plastic bag. Place a towel between your skin and the bag. Leave the ice on for 20 minutes, 2-3 times a day. Move your fingers or toes often to avoid stiffness and to lessen swelling. Raise (elevate) the injured area above the level of your heart while you are sitting or lying down. Wear an elastic bandage as told by your health care provider. Make sure that it is not too tight.  General instructions Treatment may include medicines for pain and inflammation that are taken by mouth or applied to the skin, prescription pain medicine, or muscle relaxants. Take over-the-counter and prescription medicines only as told by your health care provider. Restrict your activity and rest the injured muscle as told by your health care provider. Gentle movements may be allowed. If physical therapy was prescribed, do exercises as told by your health care provider. Do not put pressure on any part of the splint until it is fully hardened. This may take several hours. Do not use any products that contain nicotine or tobacco, such as cigarettes and e-cigarettes. These can delay bone healing. If you need help quitting, ask your health care provider. Ask your health care provider when it is safe to drive if you have a splint. Keep all follow-up visits as told by your health care provider. This is important. How is this prevented? Warm up before exercising. This helps to prevent future muscle strains. Contact a health care provider if: You have more pain or swelling in the injured area. Get help   right away if: You have numbness or tingling or lose a lot of strength in the injured area. Summary A muscle strain is an injury that occurs when a muscle is stretched beyond its normal length. This condition is caused when a sudden, violent force is placed on a muscle and stretches it too far. This  condition is initially treated with PRICE therapy, which involves protecting, resting, icing, compressing, and elevating. Gentle movements may be allowed. If physical therapy was prescribed, do exercises as told by your health care provider. This information is not intended to replace advice given to you by your health care provider. Make sure you discuss any questions you have with your healthcare provider. Document Revised: 10/09/2019 Document Reviewed: 10/12/2019 Elsevier Patient Education  2022 [employer]  

## 2020-08-15 LAB — CBC WITH DIFFERENTIAL/PLATELET
Absolute Monocytes: 608 cells/uL (ref 200–950)
Basophils Absolute: 61 cells/uL (ref 0–200)
Basophils Relative: 0.8 %
Eosinophils Absolute: 228 cells/uL (ref 15–500)
Eosinophils Relative: 3 %
HCT: 35.1 % (ref 35.0–45.0)
Hemoglobin: 11.7 g/dL (ref 11.7–15.5)
Lymphs Abs: 882 cells/uL (ref 850–3900)
MCH: 29.5 pg (ref 27.0–33.0)
MCHC: 33.3 g/dL (ref 32.0–36.0)
MCV: 88.4 fL (ref 80.0–100.0)
MPV: 11 fL (ref 7.5–12.5)
Monocytes Relative: 8 %
Neutro Abs: 5822 cells/uL (ref 1500–7800)
Neutrophils Relative %: 76.6 %
Platelets: 189 10*3/uL (ref 140–400)
RBC: 3.97 10*6/uL (ref 3.80–5.10)
RDW: 12.7 % (ref 11.0–15.0)
Total Lymphocyte: 11.6 %
WBC: 7.6 10*3/uL (ref 3.8–10.8)

## 2020-08-15 LAB — COMPLETE METABOLIC PANEL WITH GFR
AG Ratio: 1.9 (calc) (ref 1.0–2.5)
ALT: 23 U/L (ref 6–29)
AST: 19 U/L (ref 10–35)
Albumin: 4.2 g/dL (ref 3.6–5.1)
Alkaline phosphatase (APISO): 60 U/L (ref 37–153)
BUN: 25 mg/dL (ref 7–25)
CO2: 29 mmol/L (ref 20–32)
Calcium: 10.2 mg/dL (ref 8.6–10.4)
Chloride: 104 mmol/L (ref 98–110)
Creat: 0.86 mg/dL (ref 0.50–1.03)
Globulin: 2.2 g/dL (calc) (ref 1.9–3.7)
Glucose, Bld: 103 mg/dL — ABNORMAL HIGH (ref 65–99)
Potassium: 4.4 mmol/L (ref 3.5–5.3)
Sodium: 138 mmol/L (ref 135–146)
Total Bilirubin: 0.6 mg/dL (ref 0.2–1.2)
Total Protein: 6.4 g/dL (ref 6.1–8.1)
eGFR: 79 mL/min/{1.73_m2} (ref >=60)

## 2020-08-15 LAB — VITAMIN D 25 HYDROXY (VIT D DEFICIENCY, FRACTURES): Vit D, 25-Hydroxy: 62 ng/mL (ref 30–100)

## 2020-08-18 ENCOUNTER — Ambulatory Visit: Payer: BC Managed Care – PPO | Admitting: Physician Assistant

## 2020-08-18 ENCOUNTER — Other Ambulatory Visit: Payer: Self-pay

## 2020-08-18 ENCOUNTER — Encounter: Payer: Self-pay | Admitting: Physician Assistant

## 2020-08-18 ENCOUNTER — Ambulatory Visit: Payer: BC Managed Care – PPO | Admitting: Nurse Practitioner

## 2020-08-18 DIAGNOSIS — G4700 Insomnia, unspecified: Secondary | ICD-10-CM

## 2020-08-18 DIAGNOSIS — F319 Bipolar disorder, unspecified: Secondary | ICD-10-CM

## 2020-08-18 DIAGNOSIS — Z79899 Other long term (current) drug therapy: Secondary | ICD-10-CM

## 2020-08-18 MED ORDER — LITHIUM CARBONATE ER 300 MG PO TBCR: EXTENDED_RELEASE_TABLET | ORAL | 1 refills | Status: DC

## 2020-08-18 NOTE — Progress Notes (Signed)
[facility]  Patient ID: Tanya Tucker,  MRN: [id number]  PCP: Lucky Cowboy, MD  Date of Evaluation: 08/18/2020 Time spent:30 minutes  Chief Complaint:  Chief Complaint   Follow-up; Bipolar I disorder (HCC)      HISTORY/CURRENT STATUS: HPI for 58-month med check.  Doing really well. So well that she had thought of stopping her meds. But she decided not to.  She is able to enjoy things.  Denies decreased energy or motivation.  Appetite has not changed.  No extreme sadness, tearfulness, or feelings of hopelessness.  Denies any changes in concentration, making decisions or remembering things.  Work is going well.  She sleeps fine most of the time and feels rested when she gets up.  Denies suicidal or homicidal thoughts.  She denies increased energy with decreased need for sleep.  No impulsivity or risky behavior.  No increased libido or increased spending.  No grandiosity.  No hallucinations.  Denies dizziness, syncope, seizures, numbness, tingling, tremor, tics, unsteady gait, slurred speech, confusion. Denies muscle or joint pain, stiffness, or dystonia.  Individual Medical History/ Review of Systems: Changes? :No    Past medications for mental health diagnoses include: Lithium, Seroquel  Allergies: Dexamethasone and Prednisolone  Current Medications:  Current Outpatient Medications:    BABY ASPIRIN PO, Take 81 mg by mouth 2 (two) times daily. , Disp: , Rfl:    Cholecalciferol (VITAMIN D PO), Take 10,000 Units by mouth daily., Disp: , Rfl:    Coenzyme Q10 (CO Q 10) 100 MG CAPS, Take 1 capsule by mouth daily., Disp: , Rfl:    hydrochlorothiazide (MICROZIDE) 12.5 MG capsule, Take  1 capsule  Daily  as needed for  Fluid Retention, Disp: 90 capsule, Rfl: 1   Magnesium 250 MG TABS, Take 250 mg by mouth 2 (two) times daily., Disp: , Rfl:    Omega-3 Fatty Acids (FISH OIL) 1000 MG CAPS, Take 3,000 mg by mouth., Disp: , Rfl:    rosuvastatin (CRESTOR) 20 MG tablet,  Take 1 tablet Daily for Cholesterol, Disp: 90 tablet, Rfl: 1   zinc gluconate 50 MG tablet, Take 50 mg by mouth daily., Disp: , Rfl:    lisinopril (ZESTRIL) 10 MG tablet, Take 1 tablet by mouth once daily for blood pressure, Disp: 90 tablet, Rfl: 3   lithium carbonate (LITHOBID) 300 MG CR tablet, TAKE 1 TABLET BY MOUTH IN THE MORNING AND 2 TABLETS AT BEDTIME, Disp: 270 tablet, Rfl: 1   Multiple Vitamin (MULTIVITAMIN WITH MINERALS) TABS tablet, Take 1 tablet by mouth daily. (Patient not taking: Reported on 08/18/2020), Disp: , Rfl:    QUEtiapine (SEROQUEL) 50 MG tablet, Take 1 tablet (50 mg total) by mouth at bedtime as needed. (Patient not taking: Reported on 08/18/2020), Disp: 30 tablet, Rfl: 0 Medication Side Effects: none  Family Medical/ Social History: Changes? No  MENTAL HEALTH EXAM:  Last menstrual period 04/02/2015.There is no height or weight on file to calculate BMI.  General Appearance: Casual, Neat and Well Groomed  Eye Contact:  Good  Speech:  Clear and Coherent and Normal Rate  Volume:  Normal  Mood:  Euthymic  Affect:  Appropriate  Thought Process:  Goal Directed and Descriptions of Associations: Intact  Orientation:  Full (Time, Place, and Person)  Thought Content: Logical   Suicidal Thoughts:  No  Homicidal Thoughts:  No  Memory:  WNL  Judgement:  Good  Insight:  Good  Psychomotor Activity:  Normal  Concentration:  Concentration: Good  Recall:  Good  Fund of Knowledge: Good  Language: Good  Assets:  Desire for Improvement  ADL's:  Intact  Cognition: WNL  Prognosis:  Good   Labs from 08/14/2020 CBC was normal CMP glucose was 103, BUN and creatinine were normal, all other values normal as well Vitamin D was 62 Lithium level was not obtained.  Last 1 was September of last year at 0.9.  DIAGNOSES:    ICD-10-CM   1. Bipolar I disorder (HCC)  F31.9 Lithium level    TSH    Basic metabolic panel    2. Insomnia, unspecified type  G47.00     3. Encounter for  long-term (current) use of medications  Z79.899        Receiving Psychotherapy: Yes With Ulice Bold, [facility]   RECOMMENDATIONS:  PDMP was reviewed. I provided 30 minutes of face to face time during this encounter, including time spent before and after the visit in records review, medical decision making, and charting.  I am glad to see her doing so well! We did discuss her thoughts of going off of her medications since she feels better.  I reminded her of the fact that the medications are helping her feel better so it would not be good to go off of them.  She understands. Continue Seroquel 50 mg, 1 p.o. nightly as needed sleep. Continue lithium 300 mg, 1 p.o. every morning and 2 p.o. nightly.   Continue multivitamin, magnesium, co-Q10, calcium, vitamin D. Labs ordered as noted above.  It is okay if she waits a couple of months to have them drawn.  The recent labs did not have a TSH and lithium level. Continue therapy with Ulice Bold, [facility]. Return in 6 months.  Melony Overly, PA-C

## 2020-08-20 ENCOUNTER — Other Ambulatory Visit: Payer: Self-pay | Admitting: Obstetrics & Gynecology

## 2020-08-20 DIAGNOSIS — Z1231 Encounter for screening mammogram for malignant neoplasm of breast: Secondary | ICD-10-CM

## 2020-08-22 ENCOUNTER — Other Ambulatory Visit: Payer: Self-pay | Admitting: Adult Health

## 2020-09-01 ENCOUNTER — Ambulatory Visit: Payer: BC Managed Care – PPO

## 2020-10-20 ENCOUNTER — Ambulatory Visit: Payer: BC Managed Care – PPO

## 2020-10-27 ENCOUNTER — Ambulatory Visit: Payer: BC Managed Care – PPO

## 2020-11-03 ENCOUNTER — Other Ambulatory Visit: Payer: Self-pay | Admitting: Internal Medicine

## 2020-11-03 ENCOUNTER — Encounter: Payer: BC Managed Care – PPO | Admitting: Internal Medicine

## 2020-11-13 ENCOUNTER — Ambulatory Visit
Admission: RE | Admit: 2020-11-13 | Discharge: 2020-11-13 | Disposition: A | Discharge status: Home / Self Care | Payer: BC Managed Care – PPO | Source: Ambulatory Visit | Attending: Obstetrics & Gynecology | Admitting: Obstetrics & Gynecology

## 2020-11-13 ENCOUNTER — Other Ambulatory Visit: Payer: Self-pay

## 2020-11-13 DIAGNOSIS — Z1231 Encounter for screening mammogram for malignant neoplasm of breast: Secondary | ICD-10-CM | POA: Diagnosis not present

## 2020-11-17 ENCOUNTER — Encounter: Payer: BC Managed Care – PPO | Admitting: Nurse Practitioner

## 2020-11-21 NOTE — Progress Notes (Signed)
Complete Physical  Assessment and Plan: Tanya Tucker was seen today for annual exam.  Diagnoses and all orders for this visit:  Encounter for general adult medical examination with abnormal findings  Due Annually  Essential hypertension -     CBC with Differential/Platelet - continue medications, DASH diet, exercise and monitor at home. Call if greater than 130/80.    Mixed hyperlipidemia -     COMPLETE METABOLIC PANEL WITH GFR -     Lipid panel -     TSH Continue low saturated fat diet, medication and exercise  Abnormal glucose -     Hemoglobin A1c Continue diet and exercise  Overweight (BMI 25.0-29.9)  Continue diet and exercise, has lost 6 pounds  Bipolar 1 disorder, mixed, severe (HCC)  Continue to follow with psych Continue medications and monitor symptoms  Screening for blood or protein in urine -     Urinalysis, Routine w reflex microscopic -     Microalbumin / creatinine urine ratio  Screening for ischemic heart disease -     EKG 12-Lead  Vitamin D deficiency -     VITAMIN D 25 Hydroxy (Vit-D Deficiency, Fractures)  Medication management -     Magnesium  Need for immunization against influenza -     Flu Vaccine QUAD 6+ mos PF IM (Fluarix Quad PF)  Lumbar pain   Monitor symptoms, use heat and Advil as needed   Discussed med's effects and SE's. Screening labs and tests as requested with regular follow-up as recommended. Over 40 minutes of exam, counseling, chart review, and complex, high level critical decision making was performed this visit.  Future Appointments  Date Time Provider Department Center  02/19/2021  4:00 PM [name], [place] [facility] None  11/26/2021  9:00 AM Revonda Humphrey, NP [facility] None    HPI  60 y.o. female  presents for a complete physical and follow up for has Hypertension; Vitamin D deficiency; Insulin resistance; Mixed hyperlipidemia; Medication management; Overweight (BMI 25.0-29.9); Bipolar 1 disorder, mixed, severe (HCC); Former  smoker; FHx: heart disease; and Insomnia on their problem list..  She has been experiencing some lower back pain on right side, aching pain worse in morning after sleeping.  She does lift some heavy items at work -advil does help.  Had elevated calcium previously and has stopped Calcium supplement and multivitamin, will determine if she can restart pending lab results  BMI is Body mass index is 26.79 kg/m., she has been working on diet and exercise. Wt Readings from Last 3 Encounters:  11/26/20 173 lb 9.6 oz (78.7 kg)  08/14/20 174 lb (78.9 kg)  04/23/20 179 lb 12.8 oz (81.6 kg)    Her blood pressure has been controlled at home, today their BP is BP: 120/78 She does workout. She denies chest pain, shortness of breath, dizziness.   She is on cholesterol medication and denies myalgias. Her cholesterol is at goal. The cholesterol last visit was:   Lab Results  Component Value Date   CHOL 173 02/07/2020   HDL 48 (L) 02/07/2020   LDLCALC 89 02/07/2020   TRIG 270 (H) 02/07/2020   CHOLHDL 3.6 02/07/2020    She has been working on diet and exercise for abnormal glucose, she is on bASA, she is on ACE/ARB  Last A1C in the office was:  Lab Results  Component Value Date   HGBA1C 5.1 10/26/2019    Last GFR: Lab Results  Component Value Date   [facility] 73 02/07/2020   Lab Results  Component Value Date   GFRAA 85 02/07/2020    Patient is on Vitamin D supplement.   Lab Results  Component Value Date   VD25OH 62 08/14/2020      Current Medications:  Current Outpatient Medications on File Prior to Visit  Medication Sig Dispense Refill   BABY ASPIRIN PO Take 81 mg by mouth 2 (two) times daily.      Cholecalciferol (VITAMIN D PO) Take 10,000 Units by mouth daily.     Coenzyme Q10 (CO Q 10) 100 MG CAPS Take 1 capsule by mouth daily.     hydrochlorothiazide (MICROZIDE) 12.5 MG capsule TAKE 1 CAPSULE BY MOUTH ONCE DAILY AS NEEDED FOR FLUID RETENTION 90 capsule 0   lisinopril (ZESTRIL)  10 MG tablet Take 1 tablet by mouth once daily for blood pressure 90 tablet 3   lithium carbonate (LITHOBID) 300 MG CR tablet TAKE 1 TABLET BY MOUTH IN THE MORNING AND 2 TABLETS AT BEDTIME 270 tablet 1   Magnesium 250 MG TABS Take 250 mg by mouth 2 (two) times daily.     Multiple Vitamin (MULTIVITAMIN WITH MINERALS) TABS tablet Take 1 tablet by mouth daily.     Omega-3 Fatty Acids (FISH OIL) 1000 MG CAPS Take 3,000 mg by mouth.     QUEtiapine (SEROQUEL) 50 MG tablet Take 1 tablet (50 mg total) by mouth at bedtime as needed. 30 tablet 0   rosuvastatin (CRESTOR) 20 MG tablet Take 1 tablet Daily for Cholesterol 90 tablet 1   zinc gluconate 50 MG tablet Take 50 mg by mouth daily.     No current facility-administered medications on file prior to visit.   Allergies:  Allergies  Allergen Reactions   Dexamethasone Palpitations    INSOMNIA   Prednisolone Other (See Comments)    Causes a reverse reaction   Medical History:  She has Hypertension; Vitamin D deficiency; Insulin resistance; Mixed hyperlipidemia; Medication management; Overweight (BMI 25.0-29.9); Bipolar 1 disorder, mixed, severe (HCC); Former smoker; FHx: heart disease; and Insomnia on their problem list. Health Maintenance:   Immunization History  Administered Date(s) Administered   Influenza Inj Mdck Quad With Preservative 10/12/2017   Influenza Split 10/24/2012, 12/10/2013   Influenza, Seasonal, Injecte, Preservative Fre 12/24/2014   Influenza,inj,quad, With Preservative 03/04/2016   PFIZER(Purple Top)SARS-COV-2 Vaccination 04/20/2019, 05/11/2019   PPD Test 12/10/2013, 12/24/2014, 03/04/2016, 03/07/2017, 10/23/2018, 10/26/2019   Pneumococcal Polysaccharide-23 10/24/2012   Tdap 07/19/2007, 08/21/2019    Tetanus:08/21/19 Pneumovax: 2014 Prevnar 13: age 39 Flu vaccine:11/26/20 Zostavax: declines  LMP: N/A Pap: 04/16/20 negative MGM: 11/13/20 negative repeat 1 year DEXA: 08/17/17 Normal Colonoscopy: 09/21/17 due  2029 EGD:  Last Dental Exam: Last Eye Exam: Patient Care Team: Lucky Cowboy, MD as PCP - General (Internal Medicine)  Surgical History:  She has a past surgical history that includes Tonsillectomy and adenoidectomy. Family History:  Herfamily history includes Cataracts in her mother; Diabetes in her father; Heart disease in her father; Hypertension in her father; Osteoporosis in her mother. Social History:  She reports that she quit smoking about 4 years ago. Her smoking use included cigarettes. She has never used smokeless tobacco. She reports current alcohol use of about 2.0 standard drinks per week. She reports that she does not use drugs.  Review of Systems: Review of Systems  Constitutional:  Negative for chills and fever.  HENT:  Negative for congestion, hearing loss, sinus pain, sore throat and tinnitus.   Eyes:  Negative for blurred vision and double vision.  Respiratory:  Negative for  cough, hemoptysis, sputum production, shortness of breath and wheezing.   Cardiovascular:  Negative for chest pain, palpitations and leg swelling.  Gastrointestinal:  Negative for abdominal pain, constipation, diarrhea, heartburn, nausea and vomiting.  Genitourinary:  Negative for dysuria and urgency.  Musculoskeletal:  Positive for back pain (right sided low back). Negative for falls, joint pain, myalgias and neck pain.  Skin:  Negative for rash.  Neurological:  Negative for dizziness, tingling, tremors, weakness and headaches.  Endo/Heme/Allergies:  Does not bruise/bleed easily.  Psychiatric/Behavioral:  Negative for depression and suicidal ideas. The patient is not nervous/anxious and does not have insomnia.    Physical Exam: Estimated body mass index is 26.79 kg/m as calculated from the following:   Height as of this encounter: 5' 7.5" (1.715 m).   Weight as of this encounter: 173 lb 9.6 oz (78.7 kg). BP 120/78   Pulse 72   Temp 97.8 F (36.6 C)   Resp 16   Ht 5' 7.5" (1.715 m)    Wt 173 lb 9.6 oz (78.7 kg)   LMP 04/02/2015 (Approximate)   SpO2 98%   BMI 26.79 kg/m  General Appearance: Well nourished, in no apparent distress.  Eyes: PERRLA, EOMs, conjunctiva no swelling or erythema, normal fundi and vessels.  Sinuses: No Frontal/maxillary tenderness  ENT/Mouth: Ext aud canals clear, normal light reflex with TMs without erythema, bulging. Good dentition. No erythema, swelling, or exudate on post pharynx. Tonsils not swollen or erythematous. Hearing normal.  Neck: Supple, thyroid normal. No bruits  Respiratory: Respiratory effort normal, BS equal bilaterally without rales, rhonchi, wheezing or stridor.  Cardio: RRR without murmurs, rubs or gallops. Brisk peripheral pulses without edema.  Chest: symmetric, with normal excursions and percussion.  Breasts: Defer to GYN Abdomen: Positive bowel sounds all 4 quadrants, Soft, nontender, no guarding, rebound, hernias, masses, or organomegaly.  Lymphatics: Non tender without lymphadenopathy.  Genitourinary: Defer to GYN Musculoskeletal: Full ROM all peripheral extremities,5/5 strength, and normal gait. Unable to reproduce back pain Skin: Warm, dry without rashes, lesions, ecchymosis. Neuro: Cranial nerves intact, reflexes equal bilaterally. Normal muscle tone, no cerebellar symptoms. Sensation intact.  Psych: Awake and oriented X 3, normal affect, Insight and Judgment appropriate.   EKG: Sinus Bradycardia no ST changes.   Aubrii Sharpless W Kalayna Noy 9:10 AM [facility]

## 2020-11-26 ENCOUNTER — Ambulatory Visit (INDEPENDENT_AMBULATORY_CARE_PROVIDER_SITE_OTHER): Payer: BC Managed Care – PPO | Admitting: Nurse Practitioner

## 2020-11-26 ENCOUNTER — Other Ambulatory Visit: Payer: Self-pay

## 2020-11-26 ENCOUNTER — Encounter: Payer: Self-pay | Admitting: Nurse Practitioner

## 2020-11-26 VITALS — BP 120/78 | HR 72 | Temp 97.8°F | Resp 16 | Ht 67.5 in | Wt 173.6 lb

## 2020-11-26 DIAGNOSIS — Z23 Encounter for immunization: Secondary | ICD-10-CM

## 2020-11-26 DIAGNOSIS — Z136 Encounter for screening for cardiovascular disorders: Secondary | ICD-10-CM

## 2020-11-26 DIAGNOSIS — Z79899 Other long term (current) drug therapy: Secondary | ICD-10-CM

## 2020-11-26 DIAGNOSIS — G4700 Insomnia, unspecified: Secondary | ICD-10-CM

## 2020-11-26 DIAGNOSIS — Z0000 Encounter for general adult medical examination without abnormal findings: Secondary | ICD-10-CM

## 2020-11-26 DIAGNOSIS — F3163 Bipolar disorder, current episode mixed, severe, without psychotic features: Secondary | ICD-10-CM

## 2020-11-26 DIAGNOSIS — Z0001 Encounter for general adult medical examination with abnormal findings: Secondary | ICD-10-CM

## 2020-11-26 DIAGNOSIS — M545 Low back pain, unspecified: Secondary | ICD-10-CM

## 2020-11-26 DIAGNOSIS — E559 Vitamin D deficiency, unspecified: Secondary | ICD-10-CM

## 2020-11-26 DIAGNOSIS — Z1389 Encounter for screening for other disorder: Secondary | ICD-10-CM | POA: Diagnosis not present

## 2020-11-26 DIAGNOSIS — R7309 Other abnormal glucose: Secondary | ICD-10-CM

## 2020-11-26 DIAGNOSIS — Z13220 Encounter for screening for lipoid disorders: Secondary | ICD-10-CM

## 2020-11-26 DIAGNOSIS — I10 Essential (primary) hypertension: Secondary | ICD-10-CM

## 2020-11-26 DIAGNOSIS — E663 Overweight: Secondary | ICD-10-CM

## 2020-11-26 DIAGNOSIS — Z131 Encounter for screening for diabetes mellitus: Secondary | ICD-10-CM

## 2020-11-26 DIAGNOSIS — E782 Mixed hyperlipidemia: Secondary | ICD-10-CM

## 2020-11-26 NOTE — Patient Instructions (Signed)

## 2020-11-27 LAB — COMPLETE METABOLIC PANEL WITH GFR
AG Ratio: 1.9 (calc) (ref 1.0–2.5)
ALT: 19 U/L (ref 6–29)
AST: 17 U/L (ref 10–35)
Albumin: 4.3 g/dL (ref 3.6–5.1)
Alkaline phosphatase (APISO): 54 U/L (ref 37–153)
BUN: 23 mg/dL (ref 7–25)
CO2: 29 mmol/L (ref 20–32)
Calcium: 10.6 mg/dL — ABNORMAL HIGH (ref 8.6–10.4)
Chloride: 104 mmol/L (ref 98–110)
Creat: 0.83 mg/dL (ref 0.50–1.03)
Globulin: 2.3 g/dL (calc) (ref 1.9–3.7)
Glucose, Bld: 83 mg/dL (ref 65–99)
Potassium: 4.5 mmol/L (ref 3.5–5.3)
Sodium: 139 mmol/L (ref 135–146)
Total Bilirubin: 0.7 mg/dL (ref 0.2–1.2)
Total Protein: 6.6 g/dL (ref 6.1–8.1)
eGFR: 82 mL/min/{1.73_m2} (ref >=60)

## 2020-11-27 LAB — CBC WITH DIFFERENTIAL/PLATELET
Absolute Monocytes: 300 cells/uL (ref 200–950)
Basophils Absolute: 60 cells/uL (ref 0–200)
Basophils Relative: 1.2 %
Eosinophils Absolute: 260 cells/uL (ref 15–500)
Eosinophils Relative: 5.2 %
HCT: 39.9 % (ref 35.0–45.0)
Hemoglobin: 13 g/dL (ref 11.7–15.5)
Lymphs Abs: 1415 cells/uL (ref 850–3900)
MCH: 29.1 pg (ref 27.0–33.0)
MCHC: 32.6 g/dL (ref 32.0–36.0)
MCV: 89.5 fL (ref 80.0–100.0)
MPV: 10.8 fL (ref 7.5–12.5)
Monocytes Relative: 6 %
Neutro Abs: 2965 cells/uL (ref 1500–7800)
Neutrophils Relative %: 59.3 %
Platelets: 199 10*3/uL (ref 140–400)
RBC: 4.46 10*6/uL (ref 3.80–5.10)
RDW: 13.1 % (ref 11.0–15.0)
Total Lymphocyte: 28.3 %
WBC: 5 10*3/uL (ref 3.8–10.8)

## 2020-11-27 LAB — LIPID PANEL
Cholesterol: 195 mg/dL (ref <200)
HDL: 49 mg/dL — ABNORMAL LOW (ref >=50)
LDL Cholesterol (Calc): 116 mg/dL (calc) — ABNORMAL HIGH
Non-HDL Cholesterol (Calc): 146 mg/dL (calc) — ABNORMAL HIGH (ref <130)
Total CHOL/HDL Ratio: 4 (calc) (ref <5.0)
Triglycerides: 183 mg/dL — ABNORMAL HIGH (ref <150)

## 2020-11-27 LAB — URINALYSIS, ROUTINE W REFLEX MICROSCOPIC
Bilirubin Urine: NEGATIVE
Glucose, UA: NEGATIVE
Hgb urine dipstick: NEGATIVE
Ketones, ur: NEGATIVE
Leukocytes,Ua: NEGATIVE
Nitrite: NEGATIVE
Protein, ur: NEGATIVE
Specific Gravity, Urine: 1.013 (ref 1.001–1.035)
pH: 7.5 (ref 5.0–8.0)

## 2020-11-27 LAB — HEMOGLOBIN A1C
Hgb A1c MFr Bld: 5 % of total Hgb (ref <5.7)
Mean Plasma Glucose: 97 mg/dL
eAG (mmol/L): 5.4 mmol/L

## 2020-11-27 LAB — MICROALBUMIN / CREATININE URINE RATIO
Creatinine, Urine: 60 mg/dL (ref 20–275)
Microalb Creat Ratio: 7 mcg/mg creat (ref <30)
Microalb, Ur: 0.4 mg/dL

## 2020-11-27 LAB — VITAMIN D 25 HYDROXY (VIT D DEFICIENCY, FRACTURES): Vit D, 25-Hydroxy: 70 ng/mL (ref 30–100)

## 2020-11-27 LAB — TSH: TSH: 2.52 mIU/L (ref 0.40–4.50)

## 2020-11-27 LAB — MAGNESIUM: Magnesium: 2.1 mg/dL (ref 1.5–2.5)

## 2021-01-06 DIAGNOSIS — H5213 Myopia, bilateral: Secondary | ICD-10-CM | POA: Diagnosis not present

## 2021-01-06 DIAGNOSIS — H52203 Unspecified astigmatism, bilateral: Secondary | ICD-10-CM | POA: Diagnosis not present

## 2021-01-06 DIAGNOSIS — H25813 Combined forms of age-related cataract, bilateral: Secondary | ICD-10-CM | POA: Diagnosis not present

## 2021-01-30 ENCOUNTER — Other Ambulatory Visit: Payer: Self-pay | Admitting: Nurse Practitioner

## 2021-02-19 ENCOUNTER — Encounter: Payer: Self-pay | Admitting: Physician Assistant

## 2021-02-19 ENCOUNTER — Ambulatory Visit: Payer: BC Managed Care – PPO | Admitting: Physician Assistant

## 2021-02-19 ENCOUNTER — Other Ambulatory Visit: Payer: Self-pay

## 2021-02-19 DIAGNOSIS — Z79899 Other long term (current) drug therapy: Secondary | ICD-10-CM

## 2021-02-19 DIAGNOSIS — G4700 Insomnia, unspecified: Secondary | ICD-10-CM

## 2021-02-19 DIAGNOSIS — F319 Bipolar disorder, unspecified: Secondary | ICD-10-CM | POA: Diagnosis not present

## 2021-02-19 MED ORDER — LITHIUM CARBONATE ER 300 MG PO TBCR: EXTENDED_RELEASE_TABLET | ORAL | 1 refills | Status: DC

## 2021-02-19 NOTE — Progress Notes (Signed)
[facility]  Patient ID: Tanya Tucker,  MRN: [id number]  PCP: Lucky Cowboy, MD  Date of Evaluation: 02/19/2021 Time spent:30 minutes  Chief Complaint:  Chief Complaint   Follow-up     HISTORY/CURRENT STATUS: HPI to be in office 11-month med check.  Tanya Tucker is doing well. Lithium is still very effective. Work is good. Still works from home and would prefer to be in the office but other than that, work is fine.  She sleeps well, rarely needs the Seroquel.  Patient denies loss of interest in usual activities and is able to enjoy things.  Denies decreased energy or motivation.  Appetite has not changed.  No extreme sadness, tearfulness, or feelings of hopelessness.  Denies any changes in concentration, making decisions or remembering things.  Denies suicidal or homicidal thoughts.  Patient denies increased energy with decreased need for sleep, no increased talkativeness, no racing thoughts, no impulsivity or risky behaviors, no increased spending, no increased libido, no grandiosity, no increased irritability or anger, and no hallucinations.  No anxiety most of the time.  Occasionally, if there is a warranted trigger she will get uptight.  Not a big deal though.  Denies dizziness, syncope, seizures, numbness, tingling, tremor, tics, unsteady gait, slurred speech, confusion. Denies muscle or joint pain, stiffness, or dystonia.  Individual Medical History/ Review of Systems: Changes? :No    Past medications for mental health diagnoses include: Lithium, Seroquel  Allergies: Dexamethasone and Prednisolone  Current Medications:  Current Outpatient Medications:    BABY ASPIRIN PO, Take 81 mg by mouth 2 (two) times daily. , Disp: , Rfl:    Cholecalciferol (VITAMIN D PO), Take 10,000 Units by mouth daily., Disp: , Rfl:    Coenzyme Q10 (CO Q 10) 100 MG CAPS, Take 1 capsule by mouth daily., Disp: , Rfl:    hydrochlorothiazide (MICROZIDE) 12.5 MG capsule, TAKE 1 CAPSULE BY  MOUTH ONCE DAILY AS NEEDED FOR  FLUID  RETENTION, Disp: 90 capsule, Rfl: 3   lisinopril (ZESTRIL) 10 MG tablet, Take 1 tablet by mouth once daily for blood pressure, Disp: 90 tablet, Rfl: 3   Magnesium 250 MG TABS, Take 250 mg by mouth 2 (two) times daily., Disp: , Rfl:    Omega-3 Fatty Acids (FISH OIL) 1000 MG CAPS, Take 3,000 mg by mouth., Disp: , Rfl:    QUEtiapine (SEROQUEL) 50 MG tablet, Take 1 tablet (50 mg total) by mouth at bedtime as needed., Disp: 30 tablet, Rfl: 0   rosuvastatin (CRESTOR) 20 MG tablet, Take 1 tablet Daily for Cholesterol, Disp: 90 tablet, Rfl: 1   zinc gluconate 50 MG tablet, Take 50 mg by mouth daily., Disp: , Rfl:    lithium carbonate (LITHOBID) 300 MG CR tablet, TAKE 1 TABLET BY MOUTH IN THE MORNING AND 2 TABLETS AT BEDTIME, Disp: 270 tablet, Rfl: 1   Multiple Vitamin (MULTIVITAMIN WITH MINERALS) TABS tablet, Take 1 tablet by mouth daily. (Patient not taking: Reported on 02/19/2021), Disp: , Rfl:  Medication Side Effects: none  Family Medical/ Social History: Changes? No  MENTAL HEALTH EXAM:  Last menstrual period 04/02/2015.There is no height or weight on file to calculate BMI.  General Appearance: Casual, Neat and Well Groomed  Eye Contact:  Good  Speech:  Clear and Coherent and Normal Rate  Volume:  Normal  Mood:  Euthymic  Affect:  Appropriate  Thought Process:  Goal Directed and Descriptions of Associations: Circumstantial  Orientation:  Full (Time, Place, and Person)  Thought Content: Logical   Suicidal  Thoughts:  No  Homicidal Thoughts:  No  Memory:  WNL  Judgement:  Good  Insight:  Good  Psychomotor Activity:  Normal  Concentration:  Concentration: Good  Recall:  Good  Fund of Knowledge: Good  Language: Good  Assets:  Desire for Improvement  ADL's:  Intact  Cognition: WNL  Prognosis:  Good   Labs from 11/26/2020 CBC normal CMP BUN and creatinine normal.  Calcium 10.6, otherwise normal Magnesium normal TSH 2.52, normal Lipids, total  cholesterol 195, HDL 49, triglyceride 183, LDL 116 Hemoglobin A1c 5.0 Vitamin D 70 No lithium level  DIAGNOSES:    ICD-10-CM   1. Bipolar I disorder (HCC)  F31.9     2. Encounter for long-term (current) use of medications  Z79.899     3. Insomnia, unspecified type  G47.00         Receiving Psychotherapy: No    RECOMMENDATIONS:  PDMP was reviewed.  No results available I provided 30 minutes of face to face time during this encounter, including time spent before and after the visit in records review, medical decision making, counseling pertinent to today's visit, and charting.  I am glad to see her doing so well!  No changes in medications are necessary. I did write out a request for her PCP to draw a TSH, BMP, and lithium level to be done every 6 months when they draw labs routinely anyway.   As far as the elevated calcium, patient was told that lithium can sometimes increase the calcium, not concerning at this point, but I agree with her PCP for her not to take any calcium supplements.  Continue Seroquel 50 mg, 1 p.o. nightly as needed sleep.  She rarely takes. Continue lithium 300 mg, 1 p.o. every morning and 2 p.o. nightly.   Continue multivitamin, magnesium, co-Q10, vitamin D. Return in 6 months.  After further review of her chart, I would like for her to go ahead and get a lithium level.  Her last 1 that I see documented is 10/26/2019, was 0.9.  Since she is on HCTZ as well, I think it is important to check that level now rather than wait 3 months, as that can increase the lithium level.  Will also check BMP to see what her calcium level is now.  She thinks labs have been drawn since October and we are really requesting those.  If a lithium level has not been drawn, then it needs to be done.  I will ask one of our CMAs to contact her about this.  We will mail her the lab order to go to [facility], where she has been in the past.  She should hold those orders until she hears back from  [place], if a lithium level was indeed drawn since October 2022, and it was within normal range, it does not have to be repeated right now, same goes for BMP.   Melony Overly, PA-C

## 2021-02-20 ENCOUNTER — Telehealth: Payer: Self-pay | Admitting: Physician Assistant

## 2021-02-20 NOTE — Telephone Encounter (Signed)
Mailed labs.

## 2021-02-20 NOTE — Telephone Encounter (Signed)
Please give her a call and let her know after further review of her chart I see that the last lithium level on file was September 2021.  Once we get the most recent lab results from Dr. Oneta Rack, IF a lithium level was not done then I would like for her to have it sooner than her routine labs that I think will be in April.  Let us go ahead and mail her a lab order but have her hold off until she hears from our office as to whether to have it drawn or not.  I will also order a BMP to follow her calcium level. If she has not heard from our office 1 way or another by next Wednesday, ask her to call and see whether she needs to get the lab work drawn or not. Thank you.

## 2021-02-20 NOTE — Telephone Encounter (Signed)
Pt informed please mail lab order

## 2021-05-11 ENCOUNTER — Ambulatory Visit: Payer: BC Managed Care – PPO | Admitting: Obstetrics & Gynecology
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
## 2021-05-28 ENCOUNTER — Encounter: Payer: Self-pay | Admitting: Nurse Practitioner

## 2021-05-28 ENCOUNTER — Ambulatory Visit: Payer: BC Managed Care – PPO | Admitting: Nurse Practitioner

## 2021-05-28 VITALS — BP 110/64 | HR 67 | Temp 97.3°F | Wt 177.2 lb

## 2021-05-28 DIAGNOSIS — E663 Overweight: Secondary | ICD-10-CM | POA: Diagnosis not present

## 2021-05-28 DIAGNOSIS — I10 Essential (primary) hypertension: Secondary | ICD-10-CM | POA: Diagnosis not present

## 2021-05-28 DIAGNOSIS — F3163 Bipolar disorder, current episode mixed, severe, without psychotic features: Secondary | ICD-10-CM

## 2021-05-28 DIAGNOSIS — E782 Mixed hyperlipidemia: Secondary | ICD-10-CM

## 2021-05-28 DIAGNOSIS — M545 Low back pain, unspecified: Secondary | ICD-10-CM

## 2021-05-28 DIAGNOSIS — M25512 Pain in left shoulder: Secondary | ICD-10-CM

## 2021-05-28 DIAGNOSIS — E559 Vitamin D deficiency, unspecified: Secondary | ICD-10-CM

## 2021-05-28 DIAGNOSIS — Z79899 Other long term (current) drug therapy: Secondary | ICD-10-CM

## 2021-05-28 DIAGNOSIS — R7309 Other abnormal glucose: Secondary | ICD-10-CM | POA: Diagnosis not present

## 2021-05-29 LAB — COMPLETE METABOLIC PANEL WITH GFR
AG Ratio: 2.4 (calc) (ref 1.0–2.5)
ALT: 19 U/L (ref 6–29)
AST: 18 U/L (ref 10–35)
Albumin: 4.7 g/dL (ref 3.6–5.1)
Alkaline phosphatase (APISO): 59 U/L (ref 37–153)
BUN/Creatinine Ratio: 34 (calc) — ABNORMAL HIGH (ref 6–22)
BUN: 26 mg/dL — ABNORMAL HIGH (ref 7–25)
CO2: 29 mmol/L (ref 20–32)
Calcium: 10.1 mg/dL (ref 8.6–10.4)
Chloride: 106 mmol/L (ref 98–110)
Creat: 0.77 mg/dL (ref 0.50–1.03)
Globulin: 2 g/dL (calc) (ref 1.9–3.7)
Glucose, Bld: 85 mg/dL (ref 65–99)
Potassium: 4.6 mmol/L (ref 3.5–5.3)
Sodium: 140 mmol/L (ref 135–146)
Total Bilirubin: 0.7 mg/dL (ref 0.2–1.2)
Total Protein: 6.7 g/dL (ref 6.1–8.1)
eGFR: 89 mL/min/{1.73_m2} (ref >=60)

## 2021-05-29 LAB — LIPID PANEL
Cholesterol: 148 mg/dL (ref <200)
HDL: 49 mg/dL — ABNORMAL LOW (ref >=50)
LDL Cholesterol (Calc): 77 mg/dL (calc)
Non-HDL Cholesterol (Calc): 99 mg/dL (calc) (ref <130)
Total CHOL/HDL Ratio: 3 (calc) (ref <5.0)
Triglycerides: 137 mg/dL (ref <150)

## 2021-05-29 LAB — CBC WITH DIFFERENTIAL/PLATELET
Absolute Monocytes: 336 cells/uL (ref 200–950)
Basophils Absolute: 58 cells/uL (ref 0–200)
Basophils Relative: 1.2 %
Eosinophils Absolute: 288 cells/uL (ref 15–500)
Eosinophils Relative: 6 %
HCT: 37.3 % (ref 35.0–45.0)
Hemoglobin: 12.2 g/dL (ref 11.7–15.5)
Lymphs Abs: 1488 cells/uL (ref 850–3900)
MCH: 29.5 pg (ref 27.0–33.0)
MCHC: 32.7 g/dL (ref 32.0–36.0)
MCV: 90.1 fL (ref 80.0–100.0)
MPV: 10.6 fL (ref 7.5–12.5)
Monocytes Relative: 7 %
Neutro Abs: 2630 cells/uL (ref 1500–7800)
Neutrophils Relative %: 54.8 %
Platelets: 209 10*3/uL (ref 140–400)
RBC: 4.14 10*6/uL (ref 3.80–5.10)
RDW: 13.1 % (ref 11.0–15.0)
Total Lymphocyte: 31 %
WBC: 4.8 10*3/uL (ref 3.8–10.8)

## 2021-05-29 LAB — TSH: TSH: 2.33 mIU/L (ref 0.40–4.50)

## 2021-06-08 ENCOUNTER — Encounter: Payer: Self-pay | Admitting: Physician Assistant

## 2021-06-10 ENCOUNTER — Telehealth: Payer: Self-pay | Admitting: Nurse Practitioner

## 2021-06-10 ENCOUNTER — Other Ambulatory Visit: Payer: Self-pay | Admitting: Nurse Practitioner

## 2021-06-10 ENCOUNTER — Other Ambulatory Visit: Payer: Self-pay | Admitting: Physician Assistant

## 2021-06-10 DIAGNOSIS — F3163 Bipolar disorder, current episode mixed, severe, without psychotic features: Secondary | ICD-10-CM

## 2021-06-10 DIAGNOSIS — F319 Bipolar disorder, unspecified: Secondary | ICD-10-CM

## 2021-06-10 DIAGNOSIS — Z79899 Other long term (current) drug therapy: Secondary | ICD-10-CM

## 2021-06-10 NOTE — Telephone Encounter (Signed)
Her Psychiatrist is requesting she get a Lithium blood test done, I do see where PA Claybon Jabs has put in orders for Lithium test. But the last time we performed this test in office was 10/2019. Pt is wanting to come in for a lab only, to get this done as soon as possible. I told her someone would reach out to her soon with a response.  ?

## 2021-06-10 NOTE — Telephone Encounter (Signed)
I put the order in.  She can make lab appointment to have it drawn

## 2021-06-10 NOTE — Progress Notes (Signed)
Pt psychiatist is requesting a lithium level- order placed and will come for lab visit ?

## 2021-06-11 ENCOUNTER — Other Ambulatory Visit: Payer: BC Managed Care – PPO

## 2021-06-11 DIAGNOSIS — F3163 Bipolar disorder, current episode mixed, severe, without psychotic features: Secondary | ICD-10-CM

## 2021-06-12 ENCOUNTER — Encounter: Payer: Self-pay | Admitting: Nurse Practitioner

## 2021-06-12 LAB — LITHIUM LEVEL: Lithium Lvl: 0.9 mmol/L (ref 0.6–1.2)

## 2021-06-22 ENCOUNTER — Ambulatory Visit (INDEPENDENT_AMBULATORY_CARE_PROVIDER_SITE_OTHER): Payer: BC Managed Care – PPO | Admitting: Obstetrics & Gynecology

## 2021-06-22 ENCOUNTER — Encounter: Payer: Self-pay | Admitting: Obstetrics & Gynecology

## 2021-06-22 VITALS — BP 122/82 | Ht 66.0 in | Wt 179.0 lb

## 2021-06-22 DIAGNOSIS — Z780 Asymptomatic menopausal state: Secondary | ICD-10-CM

## 2021-06-22 DIAGNOSIS — Z01419 Encounter for gynecological examination (general) (routine) without abnormal findings: Secondary | ICD-10-CM

## 2021-06-22 NOTE — Progress Notes (Signed)
Tanya Tucker October 16, 1960 [id number]   History:    61 y.o. G2P2L2 Married.  Son married, wife expecting a baby.  Daughter doing well.     RP:  Established patient presenting for annual gyn exam    HPI: Postmenopause, well on no HRT.  No PMB.  No pelvic pain.  No pain with intercourse.  Pap Neg 04/2020.  Will repeat at 3 yrs.  Urine and bowel movements normal.  Breasts normal. Mammo 11/2020 Neg.  Patient had alopecia and is satisfied with natural hair glued to her scalp.  Body mass index 28.89.  Walking regularly and lifting small weights. Health labs with family physician. BD normal in 08/2017.  Colon 09-21-17.  Past medical history,surgical history, family history and social history were all reviewed and documented in the EPIC chart.  Gynecologic History Patient's last menstrual period was 04/02/2015 (approximate).  Obstetric History OB History  Gravida Para Term Preterm AB Living  2 2       2   SAB IAB Ectopic Multiple Live Births               # Outcome Date GA Lbr Len/2nd Weight Sex Delivery Anes PTL Lv  2 Para           1 Para              ROS: A ROS was performed and pertinent positives and negatives are included in the history. GENERAL: No fevers or chills. HEENT: No change in vision, no earache, sore throat or sinus congestion. NECK: No pain or stiffness. CARDIOVASCULAR: No chest pain or pressure. No palpitations. PULMONARY: No shortness of breath, cough or wheeze. GASTROINTESTINAL: No abdominal pain, nausea, vomiting or diarrhea, melena or bright red blood per rectum. GENITOURINARY: No urinary frequency, urgency, hesitancy or dysuria. MUSCULOSKELETAL: No joint or muscle pain, no back pain, no recent trauma. DERMATOLOGIC: No rash, no itching, no lesions. ENDOCRINE: No polyuria, polydipsia, no heat or cold intolerance. No recent change in weight. HEMATOLOGICAL: No anemia or easy bruising or bleeding. NEUROLOGIC: No headache, seizures, numbness, tingling or weakness. PSYCHIATRIC:  No depression, no loss of interest in normal activity or change in sleep pattern.     Exam:   BP 122/82   Ht 5\' 6"  (1.676 m)   Wt 179 lb (81.2 kg)   LMP 04/02/2015 (Approximate)   BMI 28.89 kg/m   Body mass index is 28.89 kg/m.  General appearance : Well developed well nourished female. No acute distress HEENT: Eyes: no retinal hemorrhage or exudates,  Neck supple, trachea midline, no carotid bruits, no thyroidmegaly Lungs: Clear to auscultation, no rhonchi or wheezes, or rib retractions  Heart: Regular rate and rhythm, no murmurs or gallops Breast:Examined in sitting and supine position were symmetrical in appearance, no palpable masses or tenderness,  no skin retraction, no nipple inversion, no nipple discharge, no skin discoloration, no axillary or supraclavicular lymphadenopathy Abdomen: no palpable masses or tenderness, no rebound or guarding Extremities: no edema or skin discoloration or tenderness  Pelvic: Vulva: Normal             Vagina: No gross lesions or discharge  Cervix: No gross lesions or discharge  Uterus  AV, normal size, shape and consistency, non-tender and mobile  Adnexa  Without masses or tenderness  Anus: Normal   Assessment/Plan:  61 y.o. female for annual exam   1. Well female exam with routine gynecological exam Postmenopause, well on no HRT.  No PMB.  No pelvic pain.  No pain with intercourse.  Pap Neg 04/2020.  Will repeat at 3 yrs.  Urine and bowel movements normal.  Breasts normal. Mammo 11/2020 Neg.  Patient had alopecia and is satisfied with natural hair glued to her scalp.  Body mass index 28.89.  Walking regularly and lifting small weights. Health labs with family physician. BD normal in 08/2017.  Colon 09-21-17.  2. Postmenopause  Postmenopause, well on no HRT.  No PMB.  No pelvic pain.  No pain with intercourse.   Genia Del MD, 3:12 PM 06/22/2021

## 2021-07-24 ENCOUNTER — Other Ambulatory Visit: Payer: Self-pay | Admitting: Adult Health

## 2021-07-24 DIAGNOSIS — E782 Mixed hyperlipidemia: Secondary | ICD-10-CM

## 2021-08-05 ENCOUNTER — Encounter: Payer: Self-pay | Admitting: Internal Medicine

## 2021-08-05 ENCOUNTER — Other Ambulatory Visit: Payer: Self-pay | Admitting: Internal Medicine

## 2021-08-05 MED ORDER — DEXAMETHASONE 1 MG PO TABS: ORAL_TABLET | ORAL | 0 refills | Status: DC

## 2021-08-18 ENCOUNTER — Ambulatory Visit: Payer: BC Managed Care – PPO | Admitting: Physician Assistant

## 2021-09-03 ENCOUNTER — Ambulatory Visit: Payer: BC Managed Care – PPO | Admitting: Physician Assistant

## 2021-09-03 ENCOUNTER — Encounter: Payer: Self-pay | Admitting: Physician Assistant

## 2021-09-03 DIAGNOSIS — Z79899 Other long term (current) drug therapy: Secondary | ICD-10-CM | POA: Diagnosis not present

## 2021-09-03 DIAGNOSIS — G4700 Insomnia, unspecified: Secondary | ICD-10-CM

## 2021-09-03 DIAGNOSIS — F319 Bipolar disorder, unspecified: Secondary | ICD-10-CM

## 2021-09-03 MED ORDER — LITHIUM CARBONATE ER 300 MG PO TBCR: EXTENDED_RELEASE_TABLET | ORAL | 1 refills | Status: DC

## 2021-09-03 NOTE — Progress Notes (Signed)
[facility]  Patient ID: Tanya Tucker,  MRN: [id number]  PCP: Lucky Cowboy, MD  Date of Evaluation: 09/03/2021 Time spent:30 minutes  Chief Complaint:  Chief Complaint   Follow-up     HISTORY/CURRENT STATUS: HPI routine 33-month med check.  Tanya Tucker is doing really well.  She has a new grandbaby on the way, due in November.  This is her first grandchild so she is very excited.  Her husband is now out 5 years from the cancer diagnosis and is doing very well.  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  She is very excited about that, is a people person and she likes the camaraderie.  Patient is able to enjoy things.  Energy and motivation are good.  Work is going well.   No extreme sadness, tearfulness, or feelings of hopelessness.  Sleeps well most of the time.  She has had to take the Seroquel once in the past 6 months to help her sleep and that was only because of increased excitement around her son and daughter-in-law's Tanya Tucker revealed party.  She rarely uses the Seroquel though.  ADLs and personal hygiene are normal.   Denies any changes in concentration, making decisions, or remembering things.  Appetite has not changed.  Weight is stable.  No anxiety.  Denies suicidal or homicidal thoughts.  Patient denies increased energy with decreased need for sleep, increased talkativeness, racing thoughts, impulsivity or risky behaviors, increased spending, increased libido, grandiosity, increased irritability or anger, paranoia, and no hallucinations.  Denies dizziness, syncope, seizures, numbness, tingling, tremor, tics, unsteady gait, slurred speech, confusion. Denies muscle or joint pain, stiffness, or dystonia.  Individual Medical History/ Review of Systems: Changes? :No    Past medications for mental health diagnoses include: Lithium, Seroquel  Allergies:  Dexamethasone and Prednisolone  Current Medications:  Current Outpatient Medications:    BABY ASPIRIN PO, Take 81 mg by mouth 2 (two) times daily. , Disp: , Rfl:    Cholecalciferol (VITAMIN D PO), Take 10,000 Units by mouth daily., Disp: , Rfl:    Coenzyme Q10 (CO Q 10) 100 MG CAPS, Take 1 capsule by mouth daily., Disp: , Rfl:    hydrochlorothiazide (MICROZIDE) 12.5 MG capsule, TAKE 1 CAPSULE BY MOUTH ONCE DAILY AS NEEDED FOR  FLUID  RETENTION, Disp: 90 capsule, Rfl: 3   lisinopril (ZESTRIL) 10 MG tablet, Take 1 tablet by mouth once daily for blood pressure, Disp: 90 tablet, Rfl: 3   Magnesium 250 MG TABS, Take 250 mg by mouth 2 (two) times daily., Disp: , Rfl:    Omega-3 Fatty Acids (FISH OIL) 1000 MG CAPS, Take 3,000 mg by mouth., Disp: , Rfl:    QUEtiapine (SEROQUEL) 50 MG tablet, Take 1 tablet (50 mg total) by mouth at bedtime as needed., Disp: 30 tablet, Rfl: 0   rosuvastatin (CRESTOR) 20 MG tablet, Take  1 tablet  Daily for Cholesterol                                         /                                      TAKE  BY                  MOUTH                    ONCE DAILY, Disp: 90 tablet, Rfl: 0   zinc gluconate 50 MG tablet, Take 50 mg by mouth daily., Disp: , Rfl:    lithium carbonate (LITHOBID) 300 MG CR tablet, TAKE 1 TABLET BY MOUTH IN THE MORNING AND 2 TABLETS AT BEDTIME, Disp: 270 tablet, Rfl: 1 Medication Side Effects: none  Family Medical/ Social History: Changes? No  MENTAL HEALTH EXAM:  Last menstrual period 04/02/2015.There is no height or weight on file to calculate BMI.  General Appearance: Casual, Neat and Well Groomed  Eye Contact:  Good  Speech:  Clear and Coherent and Normal Rate  Volume:  Normal  Mood:  Euthymic  Affect:  Appropriate  Thought Process:  Goal Directed and Descriptions of Associations: Circumstantial  Orientation:  Full (Time, Place, and Person)  Thought Content: Logical   Suicidal Thoughts:  No  Homicidal  Thoughts:  No  Memory:  WNL  Judgement:  Good  Insight:  Good  Psychomotor Activity:  Normal  Concentration:  Concentration: Good  Recall:  Good  Fund of Knowledge: Good  Language: Good  Assets:  Desire for Improvement Financial Resources/Insurance Housing Transportation Vocational/Educational  ADL's:  Intact  Cognition: WNL  Prognosis:  Good   Labs  05/28/2021 CBC with differential completely normal CMP glucose 85, creatinine 0.77, BUN 26, GFR 89, LFTs and other electrolytes within normal limits, including calcium 10.1 TSH 2.33 Total cholesterol 148, HDL 49, triglycerides 137, LDL 76  06/11/2021  lithium level 0.9  DIAGNOSES:    ICD-10-CM   1. Bipolar I disorder (HCC)  F31.9     2. Insomnia, unspecified type  G47.00     3. Encounter for long-term (current) use of medications  Z79.899       Receiving Psychotherapy: No    RECOMMENDATIONS:  PDMP was reviewed.  No results available I provided 30 minutes of face to face time during this encounter, including time spent before and after the visit in records review, medical decision making, counseling pertinent to today's visit, and charting.   Congratulations on the upcoming birth of her grandson! She is doing great so no changes need to be made. She sees her PCP in the fall and will have them fax labs to me.  I wrote out a note requesting that they specifically get a TSH, CMP, and lithium level, and send the labs to me.   Continue lithium 300 mg, 1 p.o. every morning and 2 p.o. nightly.   Continue Seroquel 50 mg, 1 p.o. nightly as needed sleep.  She rarely takes. Continue multivitamin, magnesium, co-Q10, vitamin D. Return in 6 months.  Melony Overly, PA-C

## 2021-09-24 ENCOUNTER — Other Ambulatory Visit: Payer: Self-pay | Admitting: Nurse Practitioner

## 2021-10-12 ENCOUNTER — Other Ambulatory Visit: Payer: Self-pay | Admitting: Obstetrics & Gynecology

## 2021-10-12 DIAGNOSIS — Z1231 Encounter for screening mammogram for malignant neoplasm of breast: Secondary | ICD-10-CM

## 2021-11-17 ENCOUNTER — Ambulatory Visit: Payer: BC Managed Care – PPO | Admitting: Nurse Practitioner

## 2021-11-17 ENCOUNTER — Encounter: Payer: Self-pay | Admitting: Nurse Practitioner

## 2021-11-17 VITALS — BP 112/74 | HR 79 | Temp 96.9°F

## 2021-11-17 DIAGNOSIS — L237 Allergic contact dermatitis due to plants, except food: Secondary | ICD-10-CM | POA: Diagnosis not present

## 2021-11-17 DIAGNOSIS — L299 Pruritus, unspecified: Secondary | ICD-10-CM | POA: Diagnosis not present

## 2021-11-17 DIAGNOSIS — L539 Erythematous condition, unspecified: Secondary | ICD-10-CM

## 2021-11-17 MED ORDER — HYDROXYZINE HCL 10 MG PO TABS: 10.0000 mg | ORAL_TABLET | Freq: Three times a day (TID) | ORAL | 0 refills | Status: DC | PRN

## 2021-11-17 MED ORDER — TRIAMCINOLONE ACETONIDE 0.025 % EX OINT: 1.0000 | TOPICAL_OINTMENT | Freq: Two times a day (BID) | CUTANEOUS | 0 refills | Status: DC

## 2021-11-17 NOTE — Patient Instructions (Signed)
Poison Ivy Dermatitis Poison ivy dermatitis is redness and soreness of the skin caused by chemicals in the leaves of the poison ivy plant. You may have very bad itching, swelling, a rash, and blisters. What are the causes? Touching a poison ivy plant. Touching something that has the chemical on it. This may include animals or objects that have come in contact with the plant. What increases the risk? Going outdoors often in wooded or marshy areas. Going outdoors without wearing protective clothing, such as closed shoes, long pants, and a long-sleeved shirt. What are the signs or symptoms?  Skin redness. Very bad itching. A rash that often includes bumps and blisters. The rash usually appears 48 hours after exposure, if you have been exposed before. If this is the first time you have been exposed, the rash may not appear until a week after exposure. Swelling. This may occur if the reaction is very bad. Symptoms usually last for 1-2 weeks. The first time you develop this condition, symptoms may last 3-4 weeks. How is this treated? This condition may be treated with: Hydrocortisone cream or calamine lotion to relieve itching. Oatmeal baths to soothe the skin. Medicines, such as over-the-counter antihistamine tablets. Oral steroid medicine for more severe reactions. Follow these instructions at home: Medicines Take or apply over-the-counter and prescription medicines only as told by your doctor. Use hydrocortisone cream or calamine lotion as needed to help with itching. General instructions Do not scratch or rub your skin. Put a cold, wet cloth (cold compress) on the affected areas or take baths in cool water. This will help with itching. Avoid hot baths and showers. Take oatmeal baths as needed. Use colloidal oatmeal. You can get this at a pharmacy or grocery store. Follow the instructions on the package. While you have the rash, wash your clothes right after you wear them. Keep all  follow-up visits as told by your health care provider. This is important. How is this prevented?  Know what poison ivy looks like, so you can avoid it. This plant has three leaves with flowering branches on a single stem. The leaves are glossy. The leaves have uneven edges that come to a point at the front. If you touch poison ivy, wash your skin with soap and water right away. Be sure to wash under your fingernails. When hiking or camping, wear long pants, a long-sleeved shirt, tall socks, and hiking boots. You can also use a lotion on your skin that helps to prevent contact with poison ivy. If you think that your clothes or outdoor gear came in contact with poison ivy, rinse them off with a garden hose before you bring them inside your house. When doing yard work or gardening, wear gloves, long sleeves, long pants, and boots. Wash your garden tools and gloves if they come in contact with poison ivy. If you think that your pet has come into contact with poison ivy, wash him or her with pet shampoo and water. Make sure to wear gloves while washing your pet. Contact a doctor if: You have open sores in the rash area. You have more redness, swelling, or pain in the rash area. You have redness that spreads beyond the rash area. You have fluid, blood, or pus coming from the rash area. You have a fever. You have a rash over a large area of your body. You have a rash on your eyes, mouth, or genitals. Your rash does not get better after a few weeks. Get help right away   if: Your face swells or your eyes swell shut. You have trouble breathing. You have trouble swallowing. These symptoms may be an emergency. Do not wait to see if the symptoms will go away. Get medical help right away. Call your local emergency services (911 in the [place] Do not drive yourself to the hospital. Summary Poison ivy dermatitis is redness and soreness of the skin caused by chemicals in the leaves of the poison ivy  plant. You may have skin redness, very bad itching, swelling, and a rash. Do not scratch or rub your skin. Take or apply over-the-counter and prescription medicines only as told by your doctor. This information is not intended to replace advice given to you by your health care provider. Make sure you discuss any questions you have with your health care provider. Document Revised: 11/03/2020 Document Reviewed: 11/03/2020 Elsevier Patient Education  2023 [employer]  
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
## 2021-11-23 ENCOUNTER — Ambulatory Visit: Payer: BC Managed Care – PPO

## 2021-11-24 ENCOUNTER — Ambulatory Visit: Payer: BC Managed Care – PPO

## 2021-11-25 NOTE — Progress Notes (Unsigned)
Complete Physical  Assessment and Plan: Tanya Tucker was seen today for annual exam.  Diagnoses and all orders for this visit:  Encounter for general adult medical examination with abnormal findings  Due Annually  Essential hypertension -     CBC with Differential/Platelet - continue medications, DASH diet, exercise and monitor at home. Call if greater than 130/80.    Mixed hyperlipidemia -     COMPLETE METABOLIC PANEL WITH GFR -     Lipid panel -     TSH Continue low saturated fat diet, medication and exercise  Abnormal glucose -     Hemoglobin A1c Continue diet and exercise  Overweight (BMI 25.0-29.9)  Continue diet and exercise, has lost 6 pounds  Bipolar 1 disorder, mixed, severe (HCC)  Continue to follow with psych Continue medications and monitor symptoms  Screening for blood or protein in urine -     Urinalysis, Routine w reflex microscopic -     Microalbumin / creatinine urine ratio  Screening for ischemic heart disease -     EKG 12-Lead  Vitamin D deficiency -     VITAMIN D 25 Hydroxy (Vit-D Deficiency, Fractures)  Medication management -     Magnesium  Need for immunization against influenza -     Flu Vaccine QUAD 6+ mos PF IM (Fluarix Quad PF)  Lumbar pain   Monitor symptoms, use heat and Advil as needed   Discussed med's effects and SE's. Screening labs and tests as requested with regular follow-up as recommended. Over 40 minutes of exam, counseling, chart review, and complex, high level critical decision making was performed this visit.  Future Appointments  Date Time Provider [place]  11/26/2021  9:00 AM Alycia Rossetti, NP [facility] None  01/08/2022  7:20 AM [facility] MM 2 [facility]  03/05/2022  4:30 PM Donnal Moat T, PA-C CP-CP None  11/30/2022  9:00 AM Alycia Rossetti, NP [facility] None    HPI  61 y.o. female  presents for a complete physical and follow up for has Hypertension; Vitamin D deficiency; Insulin resistance;  Mixed hyperlipidemia; Medication management; Overweight (BMI 25.0-29.9); Bipolar 1 disorder, mixed, severe (Tanya Tucker); Former smoker; FHx: heart disease; and Insomnia on their problem list..  She has been experiencing some lower back pain on right side, aching pain worse in morning after sleeping.  She does lift some heavy items at work -advil does help.  Had elevated calcium previously and has stopped Calcium supplement and multivitamin, will determine if she can restart pending lab results  BMI is There is no height or weight on file to calculate BMI., she has been working on diet and exercise. Wt Readings from Last 3 Encounters:  06/22/21 179 lb (81.2 kg)  05/28/21 177 lb 3.2 oz (80.4 kg)  11/26/20 173 lb 9.6 oz (78.7 kg)    Her blood pressure has been controlled at home, today their BP is   She does workout. She denies chest pain, shortness of breath, dizziness.   She is on cholesterol medication and denies myalgias. Her cholesterol is at goal. The cholesterol last visit was:   Lab Results  Component Value Date   CHOL 148 05/28/2021   HDL 49 (L) 05/28/2021   LDLCALC 77 05/28/2021   TRIG 137 05/28/2021   CHOLHDL 3.0 05/28/2021    She has been working on diet and exercise for abnormal glucose, she is on bASA, she is on ACE/ARB  Last A1C in the office was:  Lab Results  Component Value  Date   HGBA1C 5.0 11/26/2020    Last GFR: Lab Results  Component Value Date   GFRNONAA 73 02/07/2020   Lab Results  Component Value Date   GFRAA 85 02/07/2020    Patient is on Vitamin D supplement.   Lab Results  Component Value Date   VD25OH 66 11/26/2020      Current Medications:  Current Outpatient Medications on File Prior to Visit  Medication Sig Dispense Refill   BABY ASPIRIN PO Take 81 mg by mouth 2 (two) times daily.      Cholecalciferol (VITAMIN D PO) Take 10,000 Units by mouth daily.     Coenzyme Q10 (CO Q 10) 100 MG CAPS Take 1 capsule by mouth daily.     hydrochlorothiazide  (MICROZIDE) 12.5 MG capsule TAKE 1 CAPSULE BY MOUTH ONCE DAILY AS NEEDED FOR  FLUID  RETENTION 90 capsule 3   hydrOXYzine (ATARAX) 10 MG tablet Take 1 tablet (10 mg total) by mouth 3 (three) times daily as needed. 30 tablet 0   lisinopril (ZESTRIL) 10 MG tablet Take 1 tablet by mouth once daily for blood pressure 90 tablet 0   lithium carbonate (LITHOBID) 300 MG CR tablet TAKE 1 TABLET BY MOUTH IN THE MORNING AND 2 TABLETS AT BEDTIME 270 tablet 1   Magnesium 250 MG TABS Take 250 mg by mouth 2 (two) times daily.     Omega-3 Fatty Acids (FISH OIL) 1000 MG CAPS Take 3,000 mg by mouth.     QUEtiapine (SEROQUEL) 50 MG tablet Take 1 tablet (50 mg total) by mouth at bedtime as needed. 30 tablet 0   rosuvastatin (CRESTOR) 20 MG tablet Take  1 tablet  Daily for Cholesterol                                         /                                      TAKE                               BY                  MOUTH                    ONCE DAILY 90 tablet 0   triamcinolone (KENALOG) 0.025 % ointment Apply 1 Application topically 2 (two) times daily. 30 g 0   zinc gluconate 50 MG tablet Take 50 mg by mouth daily.     No current facility-administered medications on file prior to visit.   Allergies:  Allergies  Allergen Reactions   Dexamethasone Palpitations    INSOMNIA   Prednisolone Other (See Comments)    Causes a reverse reaction   Medical History:  She has Hypertension; Vitamin D deficiency; Insulin resistance; Mixed hyperlipidemia; Medication management; Overweight (BMI 25.0-29.9); Bipolar 1 disorder, mixed, severe ([place]); Former smoker; FHx: heart disease; and Insomnia on their problem list. Health Maintenance:   Immunization History  Administered Date(s) Administered   Influenza Inj Mdck Quad With Preservative 10/12/2017   Influenza Split 10/24/2012, 12/10/2013   Influenza, Seasonal, Injecte, Preservative Fre 12/24/2014   Influenza,inj,Quad PF,6+ Mos 11/26/2020   Influenza,inj,quad, With  Preservative 03/04/2016  PFIZER(Purple Top)SARS-COV-2 Vaccination 04/20/2019, 05/11/2019   PPD Test 12/10/2013, 12/24/2014, 03/04/2016, 03/07/2017, 10/23/2018, 10/26/2019   Pneumococcal Polysaccharide-23 10/24/2012   Tdap 07/19/2007, 08/21/2019    Tetanus:08/21/19 Pneumovax: 2014 Prevnar 13: age 64 Flu vaccine:11/26/20 Zostavax: declines  LMP: N/A Pap: 04/16/20 negative MGM: 11/13/20 negative repeat 1 year DEXA: 08/17/17 Normal Colonoscopy: 09/21/17 due 2029 EGD:  Last Dental Exam: Last Eye Exam: Patient Care Team: Unk Pinto, MD as PCP - General (Internal Medicine)  Surgical History:  She has a past surgical history that includes Tonsillectomy and adenoidectomy. Family History:  Herfamily history includes Cataracts in her mother; Diabetes in her father; Heart disease in her father; Hypertension in her father; Osteoporosis in her mother. Social History:  She reports that she quit smoking about 5 years ago. Her smoking use included cigarettes. She has never used smokeless tobacco. She reports current alcohol use of about 2.0 standard drinks of alcohol per week. She reports that she does not use drugs.  Review of Systems: Review of Systems  Constitutional:  Negative for chills and fever.  HENT:  Negative for congestion, hearing loss, sinus pain, sore throat and tinnitus.   Eyes:  Negative for blurred vision and double vision.  Respiratory:  Negative for cough, hemoptysis, sputum production, shortness of breath and wheezing.   Cardiovascular:  Negative for chest pain, palpitations and leg swelling.  Gastrointestinal:  Negative for abdominal pain, constipation, diarrhea, heartburn, nausea and vomiting.  Genitourinary:  Negative for dysuria and urgency.  Musculoskeletal:  Positive for back pain (right sided low back). Negative for falls, joint pain, myalgias and neck pain.  Skin:  Negative for rash.  Neurological:  Negative for dizziness, tingling, tremors, weakness and  headaches.  Endo/Heme/Allergies:  Does not bruise/bleed easily.  Psychiatric/Behavioral:  Negative for depression and suicidal ideas. The patient is not nervous/anxious and does not have insomnia.     Physical Exam: Estimated body mass index is 28.89 kg/m as calculated from the following:   Height as of 06/22/21: 5\' 6"  (1.676 m).   Weight as of 06/22/21: 179 lb (81.2 kg). LMP 04/02/2015 (Approximate)  General Appearance: Well nourished, in no apparent distress.  Eyes: PERRLA, EOMs, conjunctiva no swelling or erythema, normal fundi and vessels.  Sinuses: No Frontal/maxillary tenderness  ENT/Mouth: Ext aud canals clear, normal light reflex with TMs without erythema, bulging. Good dentition. No erythema, swelling, or exudate on post pharynx. Tonsils not swollen or erythematous. Hearing normal.  Neck: Supple, thyroid normal. No bruits  Respiratory: Respiratory effort normal, BS equal bilaterally without rales, rhonchi, wheezing or stridor.  Cardio: RRR without murmurs, rubs or gallops. Brisk peripheral pulses without edema.  Chest: symmetric, with normal excursions and percussion.  Breasts: Defer to GYN Abdomen: Positive bowel sounds all 4 quadrants, Soft, nontender, no guarding, rebound, hernias, masses, or organomegaly.  Lymphatics: Non tender without lymphadenopathy.  Genitourinary: Defer to GYN Musculoskeletal: Full ROM all peripheral extremities,5/5 strength, and normal gait. Unable to reproduce back pain Skin: Warm, dry without rashes, lesions, ecchymosis. Neuro: Cranial nerves intact, reflexes equal bilaterally. Normal muscle tone, no cerebellar symptoms. Sensation intact.  Psych: Awake and oriented X 3, normal affect, Insight and Judgment appropriate.   EKG: Sinus Bradycardia no ST changes.   Jerome Viglione E  10:52 AM  [facility]

## 2021-11-26 ENCOUNTER — Encounter: Payer: Self-pay | Admitting: Nurse Practitioner

## 2021-11-26 ENCOUNTER — Ambulatory Visit (INDEPENDENT_AMBULATORY_CARE_PROVIDER_SITE_OTHER): Payer: BC Managed Care – PPO | Admitting: Nurse Practitioner

## 2021-11-26 VITALS — BP 108/70 | HR 66 | Temp 97.9°F | Ht 66.5 in | Wt 177.2 lb

## 2021-11-26 DIAGNOSIS — Z79899 Other long term (current) drug therapy: Secondary | ICD-10-CM

## 2021-11-26 DIAGNOSIS — I700 Atherosclerosis of aorta: Secondary | ICD-10-CM

## 2021-11-26 DIAGNOSIS — Z1329 Encounter for screening for other suspected endocrine disorder: Secondary | ICD-10-CM

## 2021-11-26 DIAGNOSIS — E559 Vitamin D deficiency, unspecified: Secondary | ICD-10-CM | POA: Diagnosis not present

## 2021-11-26 DIAGNOSIS — Z136 Encounter for screening for cardiovascular disorders: Secondary | ICD-10-CM | POA: Diagnosis not present

## 2021-11-26 DIAGNOSIS — F3163 Bipolar disorder, current episode mixed, severe, without psychotic features: Secondary | ICD-10-CM

## 2021-11-26 DIAGNOSIS — Z131 Encounter for screening for diabetes mellitus: Secondary | ICD-10-CM | POA: Diagnosis not present

## 2021-11-26 DIAGNOSIS — Z1389 Encounter for screening for other disorder: Secondary | ICD-10-CM

## 2021-11-26 DIAGNOSIS — I10 Essential (primary) hypertension: Secondary | ICD-10-CM | POA: Diagnosis not present

## 2021-11-26 DIAGNOSIS — Z0001 Encounter for general adult medical examination with abnormal findings: Secondary | ICD-10-CM

## 2021-11-26 DIAGNOSIS — E782 Mixed hyperlipidemia: Secondary | ICD-10-CM

## 2021-11-26 DIAGNOSIS — Z13220 Encounter for screening for lipoid disorders: Secondary | ICD-10-CM | POA: Diagnosis not present

## 2021-11-26 DIAGNOSIS — Z0000 Encounter for general adult medical examination without abnormal findings: Secondary | ICD-10-CM | POA: Diagnosis not present

## 2021-11-26 DIAGNOSIS — I440 Atrioventricular block, first degree: Secondary | ICD-10-CM

## 2021-11-26 DIAGNOSIS — E663 Overweight: Secondary | ICD-10-CM

## 2021-11-26 DIAGNOSIS — R7309 Other abnormal glucose: Secondary | ICD-10-CM

## 2021-11-26 NOTE — Patient Instructions (Signed)
Fair life protein shakes Eat more frequently - try not to go more than 6 hours without protein Aim for 90 grams of protein a day- 30 breakfast/30 lunch 30 dinner Try to keep net carbs less than 50 Net Carbs=Total Carbs-fiber- sugar alcohols Exercise heartrate 120-140(fat burning zone)- walking 20-30 minutes 4 days a week  

## 2021-11-27 LAB — COMPLETE METABOLIC PANEL WITH GFR
AG Ratio: 2.2 (calc) (ref 1.0–2.5)
ALT: 21 U/L (ref 6–29)
AST: 19 U/L (ref 10–35)
Albumin: 4.6 g/dL (ref 3.6–5.1)
Alkaline phosphatase (APISO): 57 U/L (ref 37–153)
BUN/Creatinine Ratio: 32 (calc) — ABNORMAL HIGH (ref 6–22)
BUN: 29 mg/dL — ABNORMAL HIGH (ref 7–25)
CO2: 31 mmol/L (ref 20–32)
Calcium: 10.2 mg/dL (ref 8.6–10.4)
Chloride: 103 mmol/L (ref 98–110)
Creat: 0.91 mg/dL (ref 0.50–1.03)
Globulin: 2.1 g/dL (calc) (ref 1.9–3.7)
Glucose, Bld: 77 mg/dL (ref 65–99)
Potassium: 4.3 mmol/L (ref 3.5–5.3)
Sodium: 140 mmol/L (ref 135–146)
Total Bilirubin: 0.6 mg/dL (ref 0.2–1.2)
Total Protein: 6.7 g/dL (ref 6.1–8.1)
eGFR: 73 mL/min/{1.73_m2} (ref >=60)

## 2021-11-27 LAB — MAGNESIUM: Magnesium: 2.1 mg/dL (ref 1.5–2.5)

## 2021-11-27 LAB — CBC WITH DIFFERENTIAL/PLATELET
Absolute Monocytes: 307 cells/uL (ref 200–950)
Basophils Absolute: 72 cells/uL (ref 0–200)
Basophils Relative: 1.5 %
Eosinophils Absolute: 298 cells/uL (ref 15–500)
Eosinophils Relative: 6.2 %
HCT: 38.5 % (ref 35.0–45.0)
Hemoglobin: 12.4 g/dL (ref 11.7–15.5)
Lymphs Abs: 1392 cells/uL (ref 850–3900)
MCH: 28.9 pg (ref 27.0–33.0)
MCHC: 32.2 g/dL (ref 32.0–36.0)
MCV: 89.7 fL (ref 80.0–100.0)
MPV: 10.3 fL (ref 7.5–12.5)
Monocytes Relative: 6.4 %
Neutro Abs: 2731 cells/uL (ref 1500–7800)
Neutrophils Relative %: 56.9 %
Platelets: 227 10*3/uL (ref 140–400)
RBC: 4.29 10*6/uL (ref 3.80–5.10)
RDW: 13 % (ref 11.0–15.0)
Total Lymphocyte: 29 %
WBC: 4.8 10*3/uL (ref 3.8–10.8)

## 2021-11-27 LAB — HEMOGLOBIN A1C
Hgb A1c MFr Bld: 5.3 % of total Hgb (ref <5.7)
Mean Plasma Glucose: 105 mg/dL
eAG (mmol/L): 5.8 mmol/L

## 2021-11-27 LAB — URINALYSIS, ROUTINE W REFLEX MICROSCOPIC
Bilirubin Urine: NEGATIVE
Glucose, UA: NEGATIVE
Hgb urine dipstick: NEGATIVE
Ketones, ur: NEGATIVE
Leukocytes,Ua: NEGATIVE
Nitrite: NEGATIVE
Protein, ur: NEGATIVE
Specific Gravity, Urine: 1.012 (ref 1.001–1.035)
pH: 7.5 (ref 5.0–8.0)

## 2021-11-27 LAB — MICROALBUMIN / CREATININE URINE RATIO
Creatinine, Urine: 63 mg/dL (ref 20–275)
Microalb Creat Ratio: 3 mcg/mg creat (ref <30)
Microalb, Ur: 0.2 mg/dL

## 2021-11-27 LAB — LIPID PANEL
Cholesterol: 184 mg/dL (ref <200)
HDL: 46 mg/dL — ABNORMAL LOW (ref >=50)
LDL Cholesterol (Calc): 109 mg/dL (calc) — ABNORMAL HIGH
Non-HDL Cholesterol (Calc): 138 mg/dL (calc) — ABNORMAL HIGH (ref <130)
Total CHOL/HDL Ratio: 4 (calc) (ref <5.0)
Triglycerides: 172 mg/dL — ABNORMAL HIGH (ref <150)

## 2021-11-27 LAB — VITAMIN D 25 HYDROXY (VIT D DEFICIENCY, FRACTURES): Vit D, 25-Hydroxy: 88 ng/mL (ref 30–100)

## 2021-11-27 LAB — TSH: TSH: 2.16 mIU/L (ref 0.40–4.50)

## 2021-12-14 ENCOUNTER — Other Ambulatory Visit: Payer: Self-pay | Admitting: Nurse Practitioner

## 2022-01-08 ENCOUNTER — Ambulatory Visit: Payer: BC Managed Care – PPO

## 2022-01-11 ENCOUNTER — Encounter: Payer: Self-pay | Admitting: Internal Medicine

## 2022-01-11 ENCOUNTER — Ambulatory Visit: Payer: BC Managed Care – PPO

## 2022-01-17 NOTE — Progress Notes (Signed)
     Future Appointments  Date Time Provider Department  01/18/2022      2:30 PM Lucky Cowboy, MD [facility]  03/05/2022  4:30 PM [name], PA-C CP-CP  06/07/2022                   6 mo   9:30 AM Lucky Cowboy, MD [facility]  11/30/2022                cpe  9:00 AM Raynelle Dick, NP [facility]    History of Present Illness:    This very nice 61 y.o.  MWF  with  HTN, HLD, Prediabetes , bipolar disorder   and Vitamin D Deficiency presents with concerns re: query anterior chest discomfort  - not effort or activity related. Patient has a (+) FHx of Heart disease (father) .  No GI or reflux type sx's.   Medications     hydrochlorothiazide 12.5 MG capsule, TAKE 1 CAPSULE  DAILY AS NEEDED FOR  FLUID  RETENTION   lisinopri  10 MG tablet, Take 1 tablet  daily   rosuvastatin  20 MG tablet, Take  1 tablet  Daily   BABY ASPIRIN PO, Take  2  times daily.    VITAMIN D , Take 10,000 Units  daily.   Coenzyme Q10  100 MG CAPS, Take 1 capsule daily.   hydrOXYzine  10 MG tablet, Take 1 tablet  3  times daily as needed.   lithium carbonate (LITHOBID) 300 MG CR tablet, TAKE 1 TABLET IN THE MORNING AND 2 TABLETS AT BEDTIME   Magnesium 250 MG TABS, Take 2  times daily.   Omega-3  FISH OIL 1000 MG CAPS, Take 3,000 mg / day   QUEtiapine 50 MG tablet, Take 1 tablet at bedtime as needed.   triamcinolone  0.025 % ointment, Apply 2  times daily.   zinc 50 MG tablet, Take daily.  Problem list She has Hypertension; Vitamin D deficiency; Insulin resistance; Mixed hyperlipidemia; Medication management; Overweight (BMI 25.0-29.9); Bipolar 1 disorder, mixed, severe (HCC); Former smoker; FHx: heart disease; and Insomnia on their problem list.   Observations/Objective:  LMP 04/02/2015 (Approximate)   HEENT - WNL. Neck - supple.  Chest - Clear equal BS. Cor - Nl HS. RRR w/o sig MGR. PP 1(+). No edema. MS- FROM w/o deformities.  Gait Nl. Neuro -  Nl w/o focal abnormalities.  EKG - NSR - WNL     Assessment and Plan:  1. Chest pain  - EKG 12-Lead - Ambulatory referral to Cardiology  2. Essential hypertension  - EKG 12-Lead  3. Hyperlipidemia, mixed   4. Screening for heart disease  - EKG 12-Lead  5. Bipolar 1 disorder, mixed, severe (HCC)  - Lithium level  6. Medication management  - Lithium level   Follow Up Instructions:        I discussed the assessment and treatment plan with the patient. The patient was provided an opportunity to ask questions and all were answered. The patient agreed with the plan and demonstrated an understanding of the instructions.       The patient was advised to call back or seek an in-person evaluation if the symptoms worsen or if the condition fails to improve as anticipated.    Marinus Maw, MD

## 2022-01-18 ENCOUNTER — Ambulatory Visit: Payer: BC Managed Care – PPO | Admitting: Internal Medicine

## 2022-01-18 ENCOUNTER — Encounter: Payer: Self-pay | Admitting: Internal Medicine

## 2022-01-18 VITALS — BP 118/70 | HR 83 | Temp 97.9°F | Resp 16 | Ht 66.5 in | Wt 176.8 lb

## 2022-01-18 DIAGNOSIS — F3163 Bipolar disorder, current episode mixed, severe, without psychotic features: Secondary | ICD-10-CM | POA: Diagnosis not present

## 2022-01-18 DIAGNOSIS — E782 Mixed hyperlipidemia: Secondary | ICD-10-CM | POA: Diagnosis not present

## 2022-01-18 DIAGNOSIS — Z136 Encounter for screening for cardiovascular disorders: Secondary | ICD-10-CM | POA: Diagnosis not present

## 2022-01-18 DIAGNOSIS — I10 Essential (primary) hypertension: Secondary | ICD-10-CM

## 2022-01-18 DIAGNOSIS — R079 Chest pain, unspecified: Secondary | ICD-10-CM | POA: Diagnosis not present

## 2022-01-18 DIAGNOSIS — Z79899 Other long term (current) drug therapy: Secondary | ICD-10-CM | POA: Diagnosis not present

## 2022-01-19 DIAGNOSIS — H25043 Posterior subcapsular polar age-related cataract, bilateral: Secondary | ICD-10-CM | POA: Diagnosis not present

## 2022-01-19 DIAGNOSIS — H2513 Age-related nuclear cataract, bilateral: Secondary | ICD-10-CM | POA: Diagnosis not present

## 2022-01-19 DIAGNOSIS — H52203 Unspecified astigmatism, bilateral: Secondary | ICD-10-CM | POA: Diagnosis not present

## 2022-01-19 DIAGNOSIS — H18592 Other hereditary corneal dystrophies, left eye: Secondary | ICD-10-CM | POA: Diagnosis not present

## 2022-01-19 DIAGNOSIS — H5213 Myopia, bilateral: Secondary | ICD-10-CM | POA: Diagnosis not present

## 2022-01-19 DIAGNOSIS — H524 Presbyopia: Secondary | ICD-10-CM | POA: Diagnosis not present

## 2022-01-19 LAB — LITHIUM LEVEL: Lithium Lvl: 1 mmol/L (ref 0.6–1.2)

## 2022-01-19 NOTE — Progress Notes (Signed)
<><><><><><><><><><><><><><><><><><><><><><><><><><><><><><><><><> <><><><><><><><><><><><><><><><><><><><><><><><><><><><><><><><><>  -   Lithium Level is in Therapeutic range  <><><><><><><><><><><><><><><><><><><><><><><><><><><><><><><><><> <><><><><><><><><><><><><><><><><><><><><><><><><><><><><><><><><>

## 2022-01-20 NOTE — Progress Notes (Signed)
Thanks for the results, Dr. Oneta Rack. Clarisse Gouge or Sheralyn Boatman, please give her a call and let her know the Dierdre Searles level is in good range. Continue same dose of Lithium. Thanks.

## 2022-01-26 ENCOUNTER — Other Ambulatory Visit: Payer: Self-pay | Admitting: Internal Medicine

## 2022-01-26 DIAGNOSIS — E782 Mixed hyperlipidemia: Secondary | ICD-10-CM

## 2022-02-18 ENCOUNTER — Ambulatory Visit: Payer: BC Managed Care – PPO | Attending: Cardiovascular Disease | Admitting: Cardiovascular Disease

## 2022-02-18 ENCOUNTER — Encounter: Payer: Self-pay | Admitting: Cardiovascular Disease

## 2022-02-18 VITALS — BP 122/76 | HR 60 | Ht 66.5 in | Wt 179.4 lb

## 2022-02-18 DIAGNOSIS — E782 Mixed hyperlipidemia: Secondary | ICD-10-CM

## 2022-02-18 DIAGNOSIS — R0789 Other chest pain: Secondary | ICD-10-CM

## 2022-02-18 MED ORDER — ASPIRIN 81 MG PO TBEC: 81.0000 mg | DELAYED_RELEASE_TABLET | Freq: Every day | ORAL | 3 refills | Status: AC

## 2022-02-18 NOTE — Patient Instructions (Addendum)
Medication Instructions:  DECREASE Aspirin to 81mg  daily *If you need a refill on your cardiac medications before your next appointment, please call your pharmacy*   Lab Work: BMET, Lipoprotein (a) today If you have labs (blood work) drawn today and your tests are completely normal, you will receive your results only by: [place] (if you have MyChart) OR A paper copy in the mail If you have any lab test that is abnormal or we need to change your treatment, we will call you to review the results.   Testing/Procedures: Coronary CT Angiogram Your physician has requested that you have cardiac CT. Cardiac computed tomography (CT) is a painless test that uses an x-ray machine to take clear, detailed pictures of your heart. For further information please visit [email or web address]. Please follow instruction sheet as given.  Follow-Up: At [facility], you and your health needs are our priority.  As part of our continuing mission to provide you with exceptional heart care, we have created designated Provider Care Teams.  These Care Teams include your primary Cardiologist (physician) and Advanced Practice Providers (APPs -  Physician Assistants and Nurse Practitioners) who all work together to provide you with the care you need, when you need it.  Your next appointment:   3 month(s)  Provider:   Mertie Moores, MD    Other Instructions   Your cardiac CT will be scheduled at:   [facility] [address] [place], [place] [address] [phone]  Please arrive at the [facility] and Children's Entrance (Entrance C2) of [facility] 30 minutes prior to test start time. You can use the FREE valet parking offered at entrance C (encouraged to control the heart rate for the test)  Proceed to the [facility] Radiology Department (first floor) to check-in and test prep.  All radiology patients and guests should use entrance C2 at [facility], accessed from  [facility], even though the hospital's physical address listed is [address].  Please follow these instructions carefully (unless otherwise directed):  On the Night Before the Test: Be sure to Drink plenty of water. Do not consume any caffeinated/decaffeinated beverages or chocolate 12 hours prior to your test. Do not take any antihistamines 12 hours prior to your test.  On the Day of the Test: Drink plenty of water until 1 hour prior to the test. Do not eat any food 1 hour prior to test. You may take your regular medications prior to the test.  Take metoprolol (Lopressor) two hours prior to test. HOLD Hydrochlorothiazide morning of the test. FEMALES- please wear underwire-free bra if available, avoid dresses & tight clothing      After the Test: Drink plenty of water. After receiving IV contrast, you may experience a mild flushed feeling. This is normal. On occasion, you may experience a mild rash up to 24 hours after the test. This is not dangerous. If this occurs, you can take Benadryl 25 mg and increase your fluid intake. If you experience trouble breathing, this can be serious. If it is severe call 911 IMMEDIATELY. If it is mild, please call our office. If you take any of these medications: Glipizide/Metformin, Avandament, Glucavance, please do not take 48 hours after completing test unless otherwise instructed.  We will call to schedule your test 2-4 weeks out understanding that some insurance companies will need an authorization prior to the service being performed.   For non-scheduling related questions, please contact the cardiac imaging nurse navigator  should you have any questions/concerns: Marchia Bond, Cardiac Imaging Nurse Navigator Gordy Clement, Cardiac Imaging Nurse Navigator [facility] Direct Office Dial: [phone]   For scheduling needs, including cancellations and rescheduling, please call [place],  [phone].
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
## 2022-02-19 ENCOUNTER — Encounter (HOSPITAL_COMMUNITY): Payer: Self-pay

## 2022-02-19 ENCOUNTER — Ambulatory Visit: Payer: BC Managed Care – PPO

## 2022-02-19 DIAGNOSIS — E782 Mixed hyperlipidemia: Secondary | ICD-10-CM | POA: Diagnosis not present

## 2022-02-19 DIAGNOSIS — R0789 Other chest pain: Secondary | ICD-10-CM | POA: Diagnosis not present

## 2022-02-20 LAB — BASIC METABOLIC PANEL
BUN/Creatinine Ratio: 22 (ref 9–23)
BUN: 22 mg/dL (ref 6–24)
CO2: 24 mmol/L (ref 20–29)
Calcium: 10.8 mg/dL — ABNORMAL HIGH (ref 8.7–10.2)
Chloride: 99 mmol/L (ref 96–106)
Creatinine, Ser: 0.98 mg/dL (ref 0.57–1.00)
Glucose: 72 mg/dL (ref 70–99)
Potassium: 4.7 mmol/L (ref 3.5–5.2)
Sodium: 138 mmol/L (ref 134–144)
eGFR: 66 mL/min/{1.73_m2} (ref >59)

## 2022-02-20 LAB — LIPOPROTEIN A (LPA): Lipoprotein (a): 42.6 nmol/L (ref <75.0)

## 2022-02-25 ENCOUNTER — Encounter: Payer: Self-pay | Admitting: Cardiovascular Disease

## 2022-02-26 ENCOUNTER — Telehealth (HOSPITAL_COMMUNITY): Payer: Self-pay | Admitting: Emergency Medicine

## 2022-02-26 ENCOUNTER — Telehealth: Payer: Self-pay | Admitting: Cardiovascular Disease

## 2022-02-26 NOTE — Telephone Encounter (Signed)
Patient wants to know if she will need to do the CT CARDIAC [facility] test on Monday (1/20).  Patient stated Dr. Acie Fredrickson was to review patient's lipoprotein test results and confirm if she should still have the CT test.  Patient stated can also leave a message in her MyChart.

## 2022-02-26 NOTE — Telephone Encounter (Signed)
Pt states she is waiting to hear back from ordering provider regarding lab results and whether she still needs CCTA.   She will call back with her decision. In the meantime, I have provided her with billing phone number to coordinate a payment plan for cost of the test.  Marchia Bond RN Navigator Cardiac Imaging [facility] [phone] Office  [phone] Cell

## 2022-03-01 ENCOUNTER — Ambulatory Visit (HOSPITAL_COMMUNITY): Admission: RE | Admit: 2022-03-01 | Payer: BC Managed Care – PPO | Source: Ambulatory Visit

## 2022-03-01 NOTE — Telephone Encounter (Signed)
Duplicate call/documentation.

## 2022-03-01 NOTE — Telephone Encounter (Signed)
Tanya Tucker, Tanya Cheng, Tanya Tucker  [name], RNYesterday (8:03 AM)    I'm ok with cancelling the Coronary CTA We will follow up with her in several months  PN   Called and spoke with patient who agrees that she does not want the test. She was concerned that she would be charged for it, I assured her that she will not since it's not been scheduled. I did cancel the active request for the order at this time. No further questions.

## 2022-03-02 ENCOUNTER — Other Ambulatory Visit (HOSPITAL_COMMUNITY): Payer: BC Managed Care – PPO

## 2022-03-05 ENCOUNTER — Encounter: Payer: Self-pay | Admitting: Physician Assistant

## 2022-03-05 ENCOUNTER — Ambulatory Visit: Payer: BC Managed Care – PPO | Admitting: Physician Assistant

## 2022-03-05 DIAGNOSIS — G4700 Insomnia, unspecified: Secondary | ICD-10-CM | POA: Diagnosis not present

## 2022-03-05 DIAGNOSIS — F319 Bipolar disorder, unspecified: Secondary | ICD-10-CM | POA: Diagnosis not present

## 2022-03-05 MED ORDER — LITHIUM CARBONATE ER 300 MG PO TBCR: EXTENDED_RELEASE_TABLET | ORAL | 1 refills | Status: DC

## 2022-03-05 NOTE — Progress Notes (Unsigned)
[facility] Med Check  Patient ID: Lois Slagel,  MRN: [id number]  PCP: Unk Pinto, MD  Date of Evaluation: 03/05/2022 Time spent:30 minutes  Chief Complaint:  Chief Complaint   Anxiety     HISTORY/CURRENT STATUS: HPI routine 58-month med check.  Doing really well. First grandchild was born 01/01/2022, Hart Carwin. She and her husband are over the moon!   Patient is able to enjoy things.  Energy and motivation are good.  Work is going well.   No extreme sadness, tearfulness, or feelings of hopelessness.  Sleeps well most of the time. ADLs and personal hygiene are normal.   Denies any changes in concentration, making decisions, or remembering things.  Appetite has not changed.  Weight is stable.    Denies suicidal or homicidal thoughts.  Patient denies increased energy with decreased need for sleep, increased talkativeness, racing thoughts, impulsivity or risky behaviors, increased spending, increased libido, grandiosity, increased irritability or anger, paranoia, or hallucinations.  Denies dizziness, syncope, seizures, numbness, tingling, tremor, tics, unsteady gait, slurred speech, confusion. Denies muscle or joint pain, stiffness, or dystonia.  Individual Medical History/ Review of Systems: Changes? :Yes   chest pain, saw card, LPA nl  Past medications for mental health diagnoses include: Lithium, Seroquel  Allergies: Dexamethasone and Prednisolone  Current Medications:  Current Outpatient Medications:    aspirin EC 81 MG tablet, Take 1 tablet (81 mg total) by mouth daily. Swallow whole., Disp: 90 tablet, Rfl: 3   Cholecalciferol (VITAMIN D PO), Take 10,000 Units by mouth daily., Disp: , Rfl:    Coenzyme Q10 (CO Q 10) 100 MG CAPS, Take 1 capsule by mouth daily., Disp: , Rfl:    hydrochlorothiazide (MICROZIDE) 12.5 MG capsule, TAKE 1 CAPSULE BY MOUTH ONCE DAILY AS NEEDED FOR  FLUID  RETENTION, Disp: 90 capsule, Rfl: 3   lisinopril (ZESTRIL) 10 MG tablet, Take 1 tablet  by mouth once daily for blood pressure, Disp: 90 tablet, Rfl: 0   Magnesium 250 MG TABS, Take 250 mg by mouth 2 (two) times daily., Disp: , Rfl:    Omega-3 Fatty Acids (FISH OIL) 1000 MG CAPS, Take 3,000 mg by mouth., Disp: , Rfl:    QUEtiapine (SEROQUEL) 50 MG tablet, Take 1 tablet (50 mg total) by mouth at bedtime as needed., Disp: 30 tablet, Rfl: 0   rosuvastatin (CRESTOR) 20 MG tablet, TAKE 1 TABLET BY MOUTH ONCE DAILY FOR CHOLESTEROL, Disp: 90 tablet, Rfl: 0   zinc gluconate 50 MG tablet, Take 50 mg by mouth daily., Disp: , Rfl:    lithium carbonate (LITHOBID) 300 MG ER tablet, TAKE 1 TABLET BY MOUTH IN THE MORNING AND 2 TABLETS AT BEDTIME, Disp: 270 tablet, Rfl: 1 Medication Side Effects: none  Family Medical/ Social History: Changes? No  MENTAL HEALTH EXAM:  Last menstrual period 04/02/2015.There is no height or weight on file to calculate BMI.  General Appearance: Casual, Neat and Well Groomed  Eye Contact:  Good  Speech:  Clear and Coherent and Normal Rate  Volume:  Normal  Mood:  Euthymic  Affect:  Appropriate  Thought Process:  Goal Directed and Descriptions of Associations: Circumstantial  Orientation:  Full (Time, Place, and Person)  Thought Content: Logical   Suicidal Thoughts:  No  Homicidal Thoughts:  No  Memory:  WNL  Judgement:  Good  Insight:  Good  Psychomotor Activity:  Normal  Concentration:  Concentration: Good  Recall:  Good  Fund of Knowledge: Good  Language: Good  Assets:  Desire for Improvement Financial Resources/Insurance  Housing Physical Health Resilience Transportation Vocational/Educational  ADL's:  Intact  Cognition: WNL  Prognosis:  Good   Labs   01/18/2022  Lithium 1.0  02/19/2022 BMP nl Cr, Ca 10.8  11/26/2021 TSH 2.16   DIAGNOSES:    ICD-10-CM   1. Bipolar I disorder ([place])  F31.9     2. Insomnia, unspecified type  G47.00       Receiving Psychotherapy: Yes  Myrtis Hopping, [facility]  RECOMMENDATIONS:  PDMP was  reviewed.  No controlled substances.  I provided 30 minutes of face to face time during this encounter, including time spent before and after the visit in records review, medical decision making, counseling pertinent to today's visit, and charting.   She's doing great so no med changes are needed.  Discussed her labs.  Slight elevation in calcium is not worrisome, can come from lithium.  Continue lithium 300 mg, 1 p.o. every morning and 2 p.o. nightly.   Continue Seroquel 50 mg, 1 p.o. nightly as needed sleep.  She rarely takes. Continue multivitamin, magnesium, co-Q10, vitamin D. Handwritten note for next lab draw in April or May 2 have a lithium level, BMP, and TSH drawn. Continue therapy with Rosary Lively, [facility].  Return in 6 months.  Donnal Moat, PA-C

## 2022-03-15 ENCOUNTER — Other Ambulatory Visit: Payer: Self-pay | Admitting: Nurse Practitioner

## 2022-03-15 ENCOUNTER — Other Ambulatory Visit: Payer: Self-pay

## 2022-03-15 MED ORDER — HYDROCHLOROTHIAZIDE 12.5 MG PO CAPS: ORAL_CAPSULE | ORAL | 3 refills | Status: AC

## 2022-03-30 ENCOUNTER — Ambulatory Visit
Admission: RE | Admit: 2022-03-30 | Discharge: 2022-03-30 | Disposition: A | Discharge status: Home / Self Care | Payer: BC Managed Care – PPO | Source: Ambulatory Visit | Attending: Obstetrics & Gynecology | Admitting: Obstetrics & Gynecology

## 2022-03-30 DIAGNOSIS — Z1231 Encounter for screening mammogram for malignant neoplasm of breast: Secondary | ICD-10-CM | POA: Diagnosis not present

## 2022-04-14 ENCOUNTER — Telehealth: Payer: Self-pay | Admitting: Physician Assistant

## 2022-04-14 MED ORDER — QUETIAPINE FUMARATE 50 MG PO TABS: 50.0000 mg | ORAL_TABLET | Freq: Every evening | ORAL | 1 refills | Status: DC | PRN

## 2022-04-14 NOTE — Telephone Encounter (Signed)
Pt called and would like her quietapine sent to the [facility] on [address]. Pt is out

## 2022-05-25 ENCOUNTER — Ambulatory Visit: Payer: BC Managed Care – PPO | Admitting: Cardiovascular Disease

## 2022-06-06 NOTE — Patient Instructions (Addendum)
Due to recent changes in healthcare laws, you may see the results of your imaging and laboratory studies on MyChart before your provider has had a chance to review them.  We understand that in some cases there may be results that are confusing or concerning to you. Not all laboratory results come back in the same time frame and the provider may be waiting for multiple results in order to interpret others.  Please give us 48 hours in order for your provider to thoroughly review all the results before contacting the office for clarification of your results.  ++++++++++++++++++++++++++  Vit D  & Vit C 1,000 mg   are recommended to help protect  against the Covid-19 and other Corona viruses.    Also it's recommended  to take  Zinc 50 mg  to help  protect against the Covid-19   and best place to get  is also on [employer]  and don't pay more than 6-8 cents /pill !   +++++++++++++++++++++++++++++++++++++++ Recommend Adult Low Dose Aspirin or  coated  Aspirin 81 mg daily  To reduce risk of Colon Cancer 40 %,  Skin Cancer 26 % ,  Melanoma 46%  and  Pancreatic cancer 60% +++++++++++++++++++++++++++++++++++++++++ Vitamin D goal  is between 70-100.  Please make sure that you are taking your Vitamin D as directed.  It is very important as a natural anti-inflammatory  helping hair, skin, and nails, as well as reducing stroke and heart attack risk.  It helps your bones and helps with mood. It also decreases numerous cancer risks so please take it as directed.  Low Vit D is associated with a 200-300% higher risk for CANCER  and 200-300% higher risk for HEART   ATTACK  &  STROKE.   ...................................... It is also associated with higher death rate at younger ages,  autoimmune diseases like Rheumatoid arthritis, Lupus, Multiple Sclerosis.    Also many other serious conditions, like depression, Alzheimer's Dementia, infertility, muscle aches, fatigue, fibromyalgia - just to name  a few. +++++++++++++++++++++++++++++++++++++++++ Recommend the book "The END of DIETING" by Dr Joel Fuhrman  & the book "The END of DIABETES " by Dr Joel Fuhrman At [employer] - get book & Audio CD's    Being diabetic has a  300% increased risk for heart attack, stroke, cancer, and alzheimer- type vascular dementia. It is very important that you work harder with diet by avoiding all foods that are white. Avoid white rice (brown & wild rice is OK), white potatoes (sweetpotatoes in moderation is OK), White bread or wheat bread or anything made out of white flour like bagels, donuts, rolls, buns, biscuits, cakes, pastries, cookies, pizza crust, and pasta (made from white flour & egg whites) - vegetarian pasta or spinach or wheat pasta is OK. Multigrain breads like Arnold's or [employer], or multigrain sandwich thins or flatbreads.  Diet, exercise and weight loss can reverse and cure diabetes in the early stages.  Diet, exercise and weight loss is very important in the control and prevention of complications of diabetes which affects every system in your body, ie. Brain - dementia/stroke, eyes - glaucoma/blindness, heart - heart attack/heart failure, kidneys - dialysis, stomach - gastric paralysis, intestines - malabsorption, nerves - severe painful neuritis, circulation - gangrene & loss of a leg(s), and finally cancer and Alzheimers.    I recommend avoid fried & greasy foods,  sweets/candy, white rice (brown or wild rice or Quinoa is OK), white potatoes (sweet potatoes are OK) - anything   made from white flour - bagels, doughnuts, rolls, buns, biscuits,white and wheat breads, pizza crust and traditional pasta made of white flour & egg white(vegetarian pasta or spinach or wheat pasta is OK).  Multi-grain bread is OK - like multi-grain flat bread or sandwich thins. Avoid alcohol in excess. Exercise is also important.    Eat all the vegetables you want - avoid meat, especially red meat and dairy - especially  cheese.  Cheese is the most concentrated form of trans-fats which is the worst thing to clog up our arteries. Veggie cheese is OK which can be found in the fresh produce section at [employer] or [employer] or [employer]  +++++++++++++++++++++++++++++++++++++++ DASH Eating Plan  DASH stands for "Dietary Approaches to Stop Hypertension."   The DASH eating plan is a healthy eating plan that has been shown to reduce high blood pressure (hypertension). Additional health benefits may include reducing the risk of type 2 diabetes mellitus, heart disease, and stroke. The DASH eating plan may also help with weight loss. WHAT DO I NEED TO KNOW ABOUT THE DASH EATING PLAN? For the DASH eating plan, you will follow these general guidelines: Choose foods with a percent daily value for sodium of less than 5% (as listed on the food label). Use salt-free seasonings or herbs instead of table salt or sea salt. Check with your health care provider or pharmacist before using salt substitutes. Eat lower-sodium products, often labeled as "lower sodium" or "no salt added." Eat fresh foods. Eat more vegetables, fruits, and low-fat dairy products. Choose whole grains. Look for the word "whole" as the first word in the ingredient list. Choose fish  Limit sweets, desserts, sugars, and sugary drinks. Choose heart-healthy fats. Eat veggie cheese  Eat more home-cooked food and less restaurant, buffet, and fast food. Limit fried foods. Cook foods using methods other than frying. Limit canned vegetables. If you do use them, rinse them well to decrease the sodium. When eating at a restaurant, ask that your food be prepared with less salt, or no salt if possible.                      WHAT FOODS CAN I EAT? Read Dr Joel Fuhrman's books on The End of Dieting & The End of Diabetes  Grains Whole grain or whole wheat bread. Brown rice. Whole grain or whole wheat pasta. Quinoa, bulgur, and whole grain cereals. Low-sodium  cereals. Corn or whole wheat flour tortillas. Whole grain cornbread. Whole grain crackers. Low-sodium crackers.  Vegetables Fresh or frozen vegetables (raw, steamed, roasted, or grilled). Low-sodium or reduced-sodium tomato and vegetable juices. Low-sodium or reduced-sodium tomato sauce and paste. Low-sodium or reduced-sodium canned vegetables.   Fruits All fresh, canned (in natural juice), or frozen fruits.  Protein Products  All fish and seafood.  Dried beans, peas, or lentils. Unsalted nuts and seeds. Unsalted canned beans.  Dairy Low-fat dairy products, such as skim or 1% milk, 2% or reduced-fat cheeses, low-fat ricotta or cottage cheese, or plain low-fat yogurt. Low-sodium or reduced-sodium cheeses.  Fats and Oils Tub margarines without trans fats. Light or reduced-fat mayonnaise and salad dressings (reduced sodium). Avocado. Safflower, olive, or canola oils. Natural peanut or almond butter.  Other Unsalted popcorn and pretzels. The items listed above may not be a complete list of recommended foods or beverages. Contact your dietitian for more options.  +++++++++++++++  WHAT FOODS ARE NOT RECOMMENDED? Grains/ White flour or wheat flour White bread. White pasta. White rice. Refined   cornbread. Bagels and croissants. Crackers that contain trans fat.  Vegetables  Creamed or fried vegetables. Vegetables in a . Regular canned vegetables. Regular canned tomato sauce and paste. Regular tomato and vegetable juices.  Fruits Dried fruits. Canned fruit in light or heavy syrup. Fruit juice.  Meat and Other Protein Products Meat in general - RED meat & White meat.  Fatty cuts of meat. Ribs, chicken wings, all processed meats as bacon, sausage, bologna, salami, fatback, hot dogs, bratwurst and packaged luncheon meats.  Dairy Whole or 2% milk, cream, half-and-half, and cream cheese. Whole-fat or sweetened yogurt. Full-fat cheeses or blue cheese. Non-dairy creamers and whipped toppings.  Processed cheese, cheese spreads, or cheese curds.  Condiments Onion and garlic salt, seasoned salt, table salt, and sea salt. Canned and packaged gravies. Worcestershire sauce. Tartar sauce. Barbecue sauce. Teriyaki sauce. Soy sauce, including reduced sodium. Steak sauce. Fish sauce. Oyster sauce. Cocktail sauce. Horseradish. Ketchup and mustard. Meat flavorings and tenderizers. Bouillon cubes. Hot sauce. Tabasco sauce. Marinades. Taco seasonings. Relishes.  Fats and Oils Butter, stick margarine, lard, shortening and bacon fat. Coconut, palm kernel, or palm oils. Regular salad dressings.  Pickles and olives. Salted popcorn and pretzels.  The items listed above may not be a complete list of foods and beverages to avoid. =====================================================  Lithium Extended-Release Tablets  What is this medication? LITHIUM (LITH ee um) treats bipolar disorder. It works by balancing substances in your brain that help regulate mood, behaviors, and thoughts. This medicine may be used for other purposes; ask your health care provider or pharmacist if you have questions. COMMON BRAND NAME(S): Eskalith CR, Lithobid What should I tell my care team before I take this medication? They need to know if you have any of these conditions: Active infection Breathing problems Brugada Syndrome Dehydration (diarrhea or sweating) Diet low in salt Heart disease High levels of calcium in the blood History of irregular heartbeat Kidney disease Low level of potassium or sodium in the blood Parathyroid disease Problems urinating Thyroid disease An unusual or allergic reaction to lithium, other medications, foods, dyes, or preservatives Pregnant or trying to get pregnant Breast-feeding How should I use this medication? Take this medication by mouth with a glass of water. Follow the directions on the prescription label. Swallow the tablets whole. Do not break, crush or chew. Take after a  meal or snack to avoid stomach upset. Take your doses at regular intervals. Do not take your medication more often than directed. The amount of this medication you take is very important. Taking more than the prescribed dose can cause serious side effects. Do not stop taking except on the advice of your care team. Talk to your care team about the use of this medication in children. Special care may be needed. While this medication may be prescribed for children as young as 12 years for selected conditions, precautions do apply. Overdosage: If you think you have taken too much of this medicine contact a poison control center or emergency room at once. NOTE: This medicine is only for you. Do not share this medicine with others. What if I miss a dose? If you miss a dose, take it as soon as you can. If it is almost time for your next dose, take only that dose. Do not take double or extra doses. What may interact with this medication? Do not take this medication with any of the following: Cisapride Dronedarone Pimozide Thioridazine This medication may also interact with the following: Caffeine Carbamazepine Certain medications  for depression, anxiety, or other mental health conditions Certain medications for high blood pressure Certain medications for migraine headache, such as almotriptan, eletriptan, frovatriptan, naratriptan, rizatriptan, sumatriptan, zolmitriptan Diuretics Fentanyl Linezolid MAOIs, such as Carbex, Eldepryl, Marplan, Nardil, and Parnate Medications that relax muscles for surgery Methyldopa Metronidazole NSAIDs, medications for pain and inflammation, such as ibuprofen or naproxen Other medications that cause heart rhythm changes, such as dofetilide Phenytoin Potassium iodide SGLT2 inhibitors, such as canagliflozin, dapagliflozin, empagliflozin, and ertugliflozin Sodium bicarbonate Sodium chloride St. John's Wort Theophylline Tramadol Tryptophan Urea This list may  not describe all possible interactions. Give your health care provider a list of all the medicines, herbs, non-prescription drugs, or dietary supplements you use. Also tell them if you smoke, drink alcohol, or use illegal drugs. Some items may interact with your medicine. What should I watch for while using this medication? Visit your care team for regular checks on your progress. It can take several weeks of treatment before you start to get better. The amount of salt (sodium) in your body influences the effects of this medication, and this medication can increase salt loss from the body. Eat a normal diet that includes salt. Do not change to salt substitutes. Avoid changes involving diet, or medications that include large amounts of sodium like sodium bicarbonate. Ask your care team for advice if you are not sure. Drink plenty of fluids while you are taking this medication. Avoid drinks that contain caffeine, such as coffee, tea and colas. You will need extra fluids if you have diarrhea or sweat a lot. This will help prevent toxic effects from this medication. Be careful not to get overheated during exercise, saunas, hot baths, and hot weather. Consult your care team if you have a high fever or persistent diarrhea. This medication may affect your coordination, reaction time, or judgment. Do not drive or operate machinery until you know how this medication affects you. Sit up or stand slowly to reduce the risk of dizzy or fainting spells. Drinking alcohol with this medication can increase the risk of these side effects. What side effects may I notice from receiving this medication? Side effects that you should report to your care team as soon as possible: Allergic reactions--skin rash, itching, hives, swelling of the face, lips, tongue, or throat Heart rhythm changes--fast or irregular heartbeat, dizziness, feeling faint or lightheaded, chest pain, trouble breathing Increased pressure around the  brain--severe headache, blurry vision, change in vision, nausea, vomiting Increased thirst and amount of urine Irritability, confusion, fast or irregular heartbeat, muscle stiffness, twitching muscles, sweating, high fever, seizure, chills, vomiting, diarrhea, which may be signs of serotonin syndrome Lithium toxicity--diarrhea, vomiting, tremors, loss of balance or coordination, uncontrollable eye movement, ringing of the ears, muscle weakness, twitching muscles, slurred speech, confusion Side effects that usually do not require medical attention (report to your care team if they continue or are bothersome): Dizziness Fatigue Nausea Tremors or shaking This list may not describe all possible side effects. Call your doctor for medical advice about side effects. You may report side effects to FDA at 1-800-FDA-1088. Where should I keep my medication? Keep out of the reach of children. Store at room temperature between 15 and 30 degrees C (59 and 86 degrees F). Keep container tightly closed. Protect from light. Throw away any unused medication after the expiration date. =============================================  Lithium Toxicity Lithium toxicity, also called lithium poisoning, is the condition of having too much lithium in the blood. Lithium is a medicine that is used to  treat bipolar disorder. Lithium toxicity can be life-threatening. What are the causes? This condition is caused by: Taking in too much lithium. This causes levels of lithium to rise in your body. Taking lithium on a regular basis and: Having a condition that raises lithium levels in your body. Taking another medicine that raises lithium levels in your body. Taking excess amounts of lithium in trying to take one's life (suicide attempt). A decrease in kidney (renal) function because of dehydration, or as a side effect of another medicine. What increases the risk? This condition is more likely to develop in people who: Are  older, have lower renal function because of age, and are taking multiple medicines. Have renal disease. Have heart disease. Have dehydration that is caused by sweating or diarrhea. Have low salt (sodium) levels in the body. Certain medicines can increase the risk for this condition. They include: Water pills (diuretics). Certain medicines for high blood pressure. Nonsteroidal anti-inflammatory drugs (NSAIDs). What are the signs or symptoms? Symptoms of this condition include the following: Mild to moderate symptoms Diarrhea or nausea and vomiting. Drowsiness. Thirst. Muscle weakness or lack of coordination. Slurred speech. Urinating often. Being restless (agitation). Moderate to severe symptoms Blurred vision. Feeling giddy. Ringing in the ears. Severe muscle spasms. Seizures. Abnormal heart rhythm. Loss of consciousness or coma. How is this diagnosed? This condition may be diagnosed based on: Your signs and symptoms. Your use of lithium. You will be asked about how much lithium you take, how long you have been taking it, and when you took your last dose. Blood tests to check the level of lithium in your blood. How is this treated? Treatment for this condition depends on how severe it is. Treatment often involves special monitoring and a hospital stay. For mild or moderate toxicity, your dosage of lithium may be reduced or stopped. For severe toxicity, lithium may be removed from your body. This is done in a hospital emergency room. It may involve: Gastric lavage. This involves placing a tube through your nose or mouth into your stomach. The tube is used to remove lithium that has not yet been absorbed. It may also be used to put medicines directly into your stomach to help stop lithium from being absorbed. Medicines that increase removal of lithium by your kidneys. Use of an artificial kidney to clean your blood (dialysis). This is usually done only in the most severe  cases. Follow these instructions at home:  Take over-the-counter and prescription medicines only as told by your health care provider. Drink enough fluid to keep your urine pale yellow. If your toxicity was a result of an overdose attempt, work with a mental health care provider (psychiatrist or counselor). Do not drink alcohol. Keep all follow-up visits. This is important. How is this prevented? Lithium toxicity is preventable. To keep it from happening again: Talk with your health care provider before starting a low-sodium diet. Have your blood lithium levels checked regularly. Watch for signs and symptoms of lithium toxicity. If you have signs or symptoms, get treatment early. This can help keep severe symptoms from developing. When starting a new medicine, always ask about the risk that it will react with your lithium. This is called an interaction. Contact a health care provider if: You have signs or symptoms of mild or moderate lithium toxicity after you receive treatment, even if your blood lithium level is normal. Get help right away if: Your symptoms get worse. You develop new symptoms. You have signs or symptoms  of severe lithium toxicity after you receive treatment. You have thoughts about harming yourself or others. You are considering suicide. If you ever feel like you may hurt yourself or others, or have thoughts about taking your own life, get help right away. Go to your nearest emergency department or: Call your local emergency services (911 in the [place] Call a suicide crisis helpline, such as the National Suicide Prevention Lifeline at (718)482-9126 or 988 in the [place] This is open 24 hours a day in the [place] Text the Crisis Text Line at (217) 108-4410 (in the [place] These symptoms may represent a serious problem that is an emergency. Do not wait to see if the symptoms will go away. Get medical help right away. Call your local emergency services (911 in the [place] Do not drive  yourself to the hospital. Summary Lithium toxicity, also called lithium poisoning, is the condition of having too much lithium in your blood. This condition results from taking too much lithium or decreased renal function that causes lithium to be removed from your body more slowly. Treatment depends on how severe your condition is. Treatment often involves special monitoring and a hospital stay. This information is not intended to replace advice given to you by your health care provider. Make sure you discuss any questions you have with your health care provider. Document Revised: 08/14/2020 Document Reviewed: 07/14/2020 [employer]vier Patient Education  2023 [employer].
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
## 2022-06-07 ENCOUNTER — Encounter: Payer: Self-pay | Admitting: Internal Medicine

## 2022-06-07 ENCOUNTER — Other Ambulatory Visit: Payer: Self-pay | Admitting: Nurse Practitioner

## 2022-06-07 ENCOUNTER — Ambulatory Visit: Payer: BC Managed Care – PPO | Admitting: Internal Medicine

## 2022-06-07 VITALS — BP 100/60 | HR 73 | Temp 97.9°F | Resp 16 | Ht 66.5 in | Wt 178.0 lb

## 2022-06-07 DIAGNOSIS — F319 Bipolar disorder, unspecified: Secondary | ICD-10-CM | POA: Diagnosis not present

## 2022-06-07 DIAGNOSIS — E559 Vitamin D deficiency, unspecified: Secondary | ICD-10-CM | POA: Diagnosis not present

## 2022-06-07 DIAGNOSIS — I10 Essential (primary) hypertension: Secondary | ICD-10-CM

## 2022-06-07 DIAGNOSIS — E782 Mixed hyperlipidemia: Secondary | ICD-10-CM | POA: Diagnosis not present

## 2022-06-07 DIAGNOSIS — Z79899 Other long term (current) drug therapy: Secondary | ICD-10-CM | POA: Diagnosis not present

## 2022-06-07 DIAGNOSIS — R7309 Other abnormal glucose: Secondary | ICD-10-CM

## 2022-06-08 LAB — COMPLETE METABOLIC PANEL WITH GFR
AG Ratio: 2 (calc) (ref 1.0–2.5)
ALT: 26 U/L (ref 6–29)
AST: 31 U/L (ref 10–35)
Albumin: 4.6 g/dL (ref 3.6–5.1)
Alkaline phosphatase (APISO): 58 U/L (ref 37–153)
BUN: 25 mg/dL (ref 7–25)
CO2: 29 mmol/L (ref 20–32)
Calcium: 10.5 mg/dL — ABNORMAL HIGH (ref 8.6–10.4)
Chloride: 104 mmol/L (ref 98–110)
Creat: 0.99 mg/dL (ref 0.50–1.03)
Globulin: 2.3 g/dL (calc) (ref 1.9–3.7)
Glucose, Bld: 92 mg/dL (ref 65–99)
Potassium: 4.9 mmol/L (ref 3.5–5.3)
Sodium: 138 mmol/L (ref 135–146)
Total Bilirubin: 0.8 mg/dL (ref 0.2–1.2)
Total Protein: 6.9 g/dL (ref 6.1–8.1)
eGFR: 66 mL/min/{1.73_m2} (ref >=60)

## 2022-06-08 LAB — MAGNESIUM: Magnesium: 2.3 mg/dL (ref 1.5–2.5)

## 2022-06-08 LAB — CBC WITH DIFFERENTIAL/PLATELET
Absolute Monocytes: 334 cells/uL (ref 200–950)
Basophils Absolute: 52 cells/uL (ref 0–200)
Basophils Relative: 1.1 %
Eosinophils Absolute: 320 cells/uL (ref 15–500)
Eosinophils Relative: 6.8 %
HCT: 37.3 % (ref 35.0–45.0)
Hemoglobin: 12.1 g/dL (ref 11.7–15.5)
Lymphs Abs: 1354 cells/uL (ref 850–3900)
MCH: 29.1 pg (ref 27.0–33.0)
MCHC: 32.4 g/dL (ref 32.0–36.0)
MCV: 89.7 fL (ref 80.0–100.0)
MPV: 10.9 fL (ref 7.5–12.5)
Monocytes Relative: 7.1 %
Neutro Abs: 2641 cells/uL (ref 1500–7800)
Neutrophils Relative %: 56.2 %
Platelets: 202 10*3/uL (ref 140–400)
RBC: 4.16 10*6/uL (ref 3.80–5.10)
RDW: 12.7 % (ref 11.0–15.0)
Total Lymphocyte: 28.8 %
WBC: 4.7 10*3/uL (ref 3.8–10.8)

## 2022-06-08 LAB — LIPID PANEL
Cholesterol: 122 mg/dL (ref <200)
HDL: 46 mg/dL — ABNORMAL LOW (ref >=50)
LDL Cholesterol (Calc): 59 mg/dL (calc)
Non-HDL Cholesterol (Calc): 76 mg/dL (calc) (ref <130)
Total CHOL/HDL Ratio: 2.7 (calc) (ref <5.0)
Triglycerides: 91 mg/dL (ref <150)

## 2022-06-08 LAB — INSULIN, RANDOM: Insulin: 17.2 u[IU]/mL

## 2022-06-08 LAB — TSH: TSH: 1.89 mIU/L (ref 0.40–4.50)

## 2022-06-08 LAB — HEMOGLOBIN A1C
Hgb A1c MFr Bld: 5.4 % of total Hgb (ref <5.7)
Mean Plasma Glucose: 108 mg/dL
eAG (mmol/L): 6 mmol/L

## 2022-06-08 LAB — VITAMIN D 25 HYDROXY (VIT D DEFICIENCY, FRACTURES): Vit D, 25-Hydroxy: 87 ng/mL (ref 30–100)

## 2022-06-08 LAB — LITHIUM LEVEL: Lithium Lvl: 1.2 mmol/L (ref 0.6–1.2)

## 2022-06-08 NOTE — Progress Notes (Signed)
^<^<^<^<^<^<^<^<^<^<^<^<^<^<^<^<^<^<^<^<^<^<^<^<^<^<^<^<^<^<^<^<^<^<^<^<^ ^>^>^>^>^>^>^>^>^>^>^>^>^>^>^>^>^>^>^>^>^>^>^>^>^>^>^>^>^>^>^>^>^>^>^>^>^  -   Please Fax Copy to: Att'n: Melony Overly @ [employer] ( Fax#  [phone] )  ^<^<^<^<^<^<^<^<^<^<^<^<^<^<^<^<^<^<^<^<^<^<^<^<^<^<^<^<^<^<^<^<^<^<^<^<^ ^>^>^>^>^>^>^>^>^>^>^>>^>^>^>^>^>^>^>^>^>^>^>^>^>^>^>^>^>^>^>^>^>^>^>^>^>  -Test results slightly outside the reference range are not unusual. If there is anything important, I will review this with you,  otherwise it is considered normal test values.  If you have further questions,  please do not hesitate to contact me at the office or via My Chart.   ^<^<^<^<^<^<^<^<^<^<^<^<^<^<^<^<^<^<^<^<^<^<^<^<^<^<^<^<^<^<^<^<^<^<^<^<^ ^>^>^>^>^>^>^>^>^>^>^>^>^>^>^>^>^>^>^>^>^>^>^>^>^>^>^>^>^>^>^>^>^>^>^>^>^  -   Chol = 122 &   LDL = 59   -   Both  Excellent   - Very low risk for Heart Attack  / Stroke  ^<^<^<^<^<^<^<^<^<^<^<^<^<^<^<^<^<^<^<^<^<^<^<^<^<^<^<^<^<^<^<^<^<^<^<^<^ ^>^>^>^>^>^>^>^>^>^>^>^>^>^>^>^>^>^>^>^>^>^>^>^>^>^>^>^>^>^>^>^>^>^>^>^>^  -   A1c = 5.4%  - Normal  - No Diabetes  -   Great !  ^<^<^<^<^<^<^<^<^<^<^<^<^<^<^<^<^<^<^<^<^<^<^<^<^<^<^<^<^<^<^<^<^<^<^<^<^ ^>^>^>^>^>^>^>^>^>^>^>^>^>^>^>^>^>^>^>^>^>^>^>^>^>^>^>^>^>^>^>^>^>^>^>^>^  -   Lithium Level = 1.2  - >  OK - Please keep dose same   ^<^<^<^<^<^<^<^<^<^<^<^<^<^<^<^<^<^<^<^<^<^<^<^<^<^<^<^<^<^<^<^<^<^<^<^<^ ^>^>^>^>^>^>^>^>^>^>^>^>^>^>^>^>^>^>^>^>^>^>^>^>^>^>^>^>^>^>^>^>^>^>^>^>^  -   Vitamin D = 87  - Excellent  !  - Please keep dose same  ^<^<^<^<^<^<^<^<^<^<^<^<^<^<^<^<^<^<^<^<^<^<^<^<^<^<^<^<^<^<^<^<^<^<^<^<^ ^>^>^>^>^>^>^>^>^>^>^>^>^>^>^>^>^>^>^>^>^>^>^>^>^>^>^>^>^>^>^>^>^>^>^>^>^  -   All Else - CBC - Kidneys - Electrolytes - Liver - Magnesium & Thyroid    - all  Normal /  OK  ^<^<^<^<^<^<^<^<^<^<^<^<^<^<^<^<^<^<^<^<^<^<^<^<^<^<^<^<^<^<^<^<^<^<^<^<^ ^>^>^>^>^>^>^>^>^>^>^>^>^>^>^>^>^>^>^>^>^>^>^>^>^>^>^>^>^>^>^>^>^>^>^>^>^  -   Keep up the [place] Work  !    ^<^<^<^<^<^<^<^<^<^<^<^<^<^<^<^<^<^<^<^<^<^<^<^<^<^<^<^<^<^<^<^<^<^<^<^<^ ^>^>^>^>^>^>^>^>^>^>^>^>^>^>^>^>^>^>^>^>^>^>^>^>^>^>^>^>^>^>^>^>^>^>^>^>^

## 2022-06-09 ENCOUNTER — Encounter: Payer: Self-pay | Admitting: Internal Medicine

## 2022-06-10 ENCOUNTER — Encounter: Payer: Self-pay | Admitting: Physician Assistant

## 2022-06-10 NOTE — Progress Notes (Signed)
Thanks Dr Oneta Rack. I will contact her through MyChart about the high normal Li level. No changes on dose for now.

## 2022-06-13 NOTE — Telephone Encounter (Signed)
Noted  

## 2022-07-03 ENCOUNTER — Other Ambulatory Visit: Payer: Self-pay | Admitting: Nurse Practitioner

## 2022-07-25 DIAGNOSIS — Z6828 Body mass index (BMI) 28.0-28.9, adult: Secondary | ICD-10-CM | POA: Diagnosis not present

## 2022-07-25 DIAGNOSIS — I10 Essential (primary) hypertension: Secondary | ICD-10-CM | POA: Diagnosis not present

## 2022-07-25 DIAGNOSIS — L237 Allergic contact dermatitis due to plants, except food: Secondary | ICD-10-CM | POA: Diagnosis not present

## 2022-08-17 ENCOUNTER — Ambulatory Visit: Payer: BC Managed Care – PPO | Admitting: Cardiovascular Disease

## 2022-09-03 ENCOUNTER — Ambulatory Visit: Payer: BC Managed Care – PPO | Admitting: Physician Assistant

## 2022-09-13 ENCOUNTER — Other Ambulatory Visit: Payer: Self-pay | Admitting: Nurse Practitioner

## 2022-09-13 DIAGNOSIS — E782 Mixed hyperlipidemia: Secondary | ICD-10-CM

## 2022-09-21 ENCOUNTER — Ambulatory Visit: Payer: BC Managed Care – PPO | Admitting: Internal Medicine

## 2022-09-28 ENCOUNTER — Ambulatory Visit: Payer: BC Managed Care – PPO | Admitting: Internal Medicine
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
## 2022-09-28 NOTE — Patient Instructions (Signed)

## 2022-09-30 ENCOUNTER — Encounter: Payer: Self-pay | Admitting: Cardiovascular Disease
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
## 2022-10-01 ENCOUNTER — Ambulatory Visit: Payer: BC Managed Care – PPO | Admitting: Physician Assistant

## 2022-10-01 ENCOUNTER — Ambulatory Visit: Payer: BC Managed Care – PPO | Attending: Cardiovascular Disease | Admitting: Cardiovascular Disease

## 2022-10-01 ENCOUNTER — Encounter: Payer: Self-pay | Admitting: Cardiovascular Disease

## 2022-10-01 VITALS — BP 122/78 | HR 60 | Ht 67.0 in | Wt 176.6 lb

## 2022-10-01 DIAGNOSIS — E782 Mixed hyperlipidemia: Secondary | ICD-10-CM | POA: Diagnosis not present

## 2022-10-01 DIAGNOSIS — R0789 Other chest pain: Secondary | ICD-10-CM | POA: Insufficient documentation

## 2022-10-01 DIAGNOSIS — I10 Essential (primary) hypertension: Secondary | ICD-10-CM

## 2022-10-01 NOTE — Patient Instructions (Signed)
Medication Instructions:   Your physician recommends that you continue on your current medications as directed. Please refer to the Current Medication list given to you today.  *If you need a refill on your cardiac medications before your next appointment, please call your pharmacy*    Follow-Up: At [facility], you and your health needs are our priority.  As part of our continuing mission to provide you with exceptional heart care, we have created designated Provider Care Teams.  These Care Teams include your primary Cardiologist (physician) and Advanced Practice Providers (APPs -  Physician Assistants and Nurse Practitioners) who all work together to provide you with the care you need, when you need it.  We recommend signing up for the patient portal called "MyChart".  Sign up information is provided on this After Visit Summary.  MyChart is used to connect with patients for Virtual Visits (Telemedicine).  Patients are able to view lab/test results, encounter notes, upcoming appointments, etc.  Non-urgent messages can be sent to your provider as well.   To learn more about what you can do with MyChart, go to [email or web address].    Your next appointment:   1 year(s)  Provider:   Philip Nahser, MD       

## 2022-10-05 ENCOUNTER — Ambulatory Visit: Payer: BC Managed Care – PPO | Admitting: Internal Medicine

## 2022-10-10 ENCOUNTER — Encounter: Payer: Self-pay | Admitting: Internal Medicine
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
## 2022-10-10 NOTE — Patient Instructions (Signed)

## 2022-10-11 ENCOUNTER — Ambulatory Visit: Payer: BC Managed Care – PPO | Admitting: Internal Medicine

## 2022-10-11 ENCOUNTER — Encounter: Payer: Self-pay | Admitting: Internal Medicine

## 2022-10-11 VITALS — BP 104/60 | HR 69 | Temp 98.0°F | Resp 17 | Ht 67.0 in | Wt 175.2 lb

## 2022-10-11 DIAGNOSIS — E559 Vitamin D deficiency, unspecified: Secondary | ICD-10-CM

## 2022-10-11 DIAGNOSIS — E782 Mixed hyperlipidemia: Secondary | ICD-10-CM | POA: Diagnosis not present

## 2022-10-11 DIAGNOSIS — M545 Low back pain, unspecified: Secondary | ICD-10-CM

## 2022-10-11 DIAGNOSIS — R7309 Other abnormal glucose: Secondary | ICD-10-CM

## 2022-10-11 DIAGNOSIS — Z79899 Other long term (current) drug therapy: Secondary | ICD-10-CM

## 2022-10-11 DIAGNOSIS — I10 Essential (primary) hypertension: Secondary | ICD-10-CM | POA: Diagnosis not present

## 2022-10-11 DIAGNOSIS — F319 Bipolar disorder, unspecified: Secondary | ICD-10-CM

## 2022-10-11 MED ORDER — MELOXICAM 15 MG PO TABS: ORAL_TABLET | ORAL | 3 refills | Status: DC

## 2022-10-12 LAB — CBC WITH DIFFERENTIAL/PLATELET
Absolute Monocytes: 388 {cells}/uL (ref 200–950)
Basophils Absolute: 71 {cells}/uL (ref 0–200)
Basophils Relative: 1.4 %
Eosinophils Absolute: 301 {cells}/uL (ref 15–500)
Eosinophils Relative: 5.9 %
HCT: 37.6 % (ref 35.0–45.0)
Hemoglobin: 12.3 g/dL (ref 11.7–15.5)
Lymphs Abs: 1469 {cells}/uL (ref 850–3900)
MCH: 29.1 pg (ref 27.0–33.0)
MCHC: 32.7 g/dL (ref 32.0–36.0)
MCV: 88.9 fL (ref 80.0–100.0)
MPV: 10.8 fL (ref 7.5–12.5)
Monocytes Relative: 7.6 %
Neutro Abs: 2871 {cells}/uL (ref 1500–7800)
Neutrophils Relative %: 56.3 %
Platelets: 211 10*3/uL (ref 140–400)
RBC: 4.23 10*6/uL (ref 3.80–5.10)
RDW: 12.9 % (ref 11.0–15.0)
Total Lymphocyte: 28.8 %
WBC: 5.1 10*3/uL (ref 3.8–10.8)

## 2022-10-12 LAB — COMPLETE METABOLIC PANEL WITH GFR
AG Ratio: 2.3 (calc) (ref 1.0–2.5)
ALT: 22 U/L (ref 6–29)
AST: 20 U/L (ref 10–35)
Albumin: 4.9 g/dL (ref 3.6–5.1)
Alkaline phosphatase (APISO): 60 U/L (ref 37–153)
BUN/Creatinine Ratio: 26 (calc) — ABNORMAL HIGH (ref 6–22)
BUN: 28 mg/dL — ABNORMAL HIGH (ref 7–25)
CO2: 29 mmol/L (ref 20–32)
Calcium: 10.6 mg/dL — ABNORMAL HIGH (ref 8.6–10.4)
Chloride: 105 mmol/L (ref 98–110)
Creat: 1.08 mg/dL — ABNORMAL HIGH (ref 0.50–1.03)
Globulin: 2.1 g/dL (ref 1.9–3.7)
Glucose, Bld: 91 mg/dL (ref 65–99)
Potassium: 4.5 mmol/L (ref 3.5–5.3)
Sodium: 140 mmol/L (ref 135–146)
Total Bilirubin: 0.8 mg/dL (ref 0.2–1.2)
Total Protein: 7 g/dL (ref 6.1–8.1)
eGFR: 59 mL/min/{1.73_m2} — ABNORMAL LOW (ref >=60)

## 2022-10-12 LAB — LITHIUM LEVEL: Lithium Lvl: 1.1 mmol/L (ref 0.6–1.2)

## 2022-10-12 LAB — LIPID PANEL
Cholesterol: 141 mg/dL (ref <200)
HDL: 48 mg/dL — ABNORMAL LOW (ref >=50)
LDL Cholesterol (Calc): 71 mg/dL
Non-HDL Cholesterol (Calc): 93 mg/dL (ref <130)
Total CHOL/HDL Ratio: 2.9 (calc) (ref <5.0)
Triglycerides: 136 mg/dL (ref <150)

## 2022-10-12 LAB — MAGNESIUM: Magnesium: 2.4 mg/dL (ref 1.5–2.5)

## 2022-10-12 LAB — TSH: TSH: 2.18 m[IU]/L (ref 0.40–4.50)

## 2022-10-12 LAB — HEMOGLOBIN A1C
Hgb A1c MFr Bld: 5.6 %{Hb} (ref <5.7)
Mean Plasma Glucose: 114 mg/dL
eAG (mmol/L): 6.3 mmol/L

## 2022-10-12 LAB — VITAMIN D 25 HYDROXY (VIT D DEFICIENCY, FRACTURES): Vit D, 25-Hydroxy: 88 ng/mL (ref 30–100)

## 2022-10-12 LAB — INSULIN, RANDOM: Insulin: 8.7 u[IU]/mL

## 2022-10-12 NOTE — Progress Notes (Signed)
<>*<>*<>*<>*<>*<>*<>*<>*<>*<>*<>*<>*<>*<>*<>*<>*<>*<>*<>*<>*<>*<>*<>*<>*<> <>*<>*<>*<>*<>*<>*<>*<>*<>*<>*<>*<>*<>*<>*<>*<>*<>*<>*<>*<>*<>*<>*<>*<>*<>  -  Test results slightly outside the reference range are not unusual. If there is anything important, I will review this with you,  otherwise it is considered normal test values.  If you have further questions,  please do not hesitate to contact me at the office or via My Chart.   <>*<>*<>*<>*<>*<>*<>*<>*<>*<>*<>*<>*<>*<>*<>*<>*<>*<>*<>*<>*<>*<>*<>*<>*<> <>*<>*<>*<>*<>*<>*<>*<>*<>*<>*<>*<>*<>*<>*<>*<>*<>*<>*<>*<>*<>*<>*<>*<>*<>  - Chem profile appears Dehydrated  !    - Kidney functions still look a little dehydrated    Very important to drink adequate amounts of fluids                                                                   to prevent permanent kidney  damage  !  - Recommend drink at least 6 bottles (16 ounces) of fluids / water /day = 96 Oz ~100 oz  - 100 oz = 3,000 cc or 3 liters / day  - >> That's 1 &1/2 bottles of a 2 liter soda bottle /day !   <>*<>*<>*<>*<>*<>*<>*<>*<>*<>*<>*<>*<>*<>*<>*<>*<>*<>*<>*<>*<>*<>*<>*<>*<> <>*<>*<>*<>*<>*<>*<>*<>*<>*<>*<>*<>*<>*<>*<>*<>*<>*<>*<>*<>*<>*<>*<>*<>*<>  -   Chol = 141  & LDL = 71  -   both    Excellent   - Very low risk for Heart Attack  / Stroke  <>*<>*<>*<>*<>*<>*<>*<>*<>*<>*<>*<>*<>*<>*<>*<>*<>*<>*<>*<>*<>*<>*<>*<>*<> <>*<>*<>*<>*<>*<>*<>*<>*<>*<>*<>*<>*<>*<>*<>*<>*<>*<>*<>*<>*<>*<>*<>*<>*<>  -   Lithium Level is at a good level  <>*<>*<>*<>*<>*<>*<>*<>*<>*<>*<>*<>*<>*<>*<>*<>*<>*<>*<>*<>*<>*<>*<>*<>*<> <>*<>*<>*<>*<>*<>*<>*<>*<>*<>*<>*<>*<>*<>*<>*<>*<>*<>*<>*<>*<>*<>*<>*<>*<>  -   A1c Normal  - No Diabetes  - Great  !  And  - Insulin level Normal also - > No Insulin  Resistance - Wonderful !   <>*<>*<>*<>*<>*<>*<>*<>*<>*<>*<>*<>*<>*<>*<>*<>*<>*<>*<>*<>*<>*<>*<>*<>*<> <>*<>*<>*<>*<>*<>*<>*<>*<>*<>*<>*<>*<>*<>*<>*<>*<>*<>*<>*<>*<>*<>*<>*<>*<>  -   Vitamin D = 88 -  Excellent  !  Please keep dosage same   <>*<>*<>*<>*<>*<>*<>*<>*<>*<>*<>*<>*<>*<>*<>*<>*<>*<>*<>*<>*<>*<>*<>*<>*<> <>*<>*<>*<>*<>*<>*<>*<>*<>*<>*<>*<>*<>*<>*<>*<>*<>*<>*<>*<>*<>*<>*<>*<>*<>  -   All Else - CBC  - Electrolytes - Liver - Magnesium & Thyroid    -   All  Normal / OK  <>*<>*<>*<>*<>*<>*<>*<>*<>*<>*<>*<>*<>*<>*<>*<>*<>*<>*<>*<>*<>*<>*<>*<>*<> <>*<>*<>*<>*<>*<>*<>*<>*<>*<>*<>*<>*<>*<>*<>*<>*<>*<>*<>*<>*<>*<>*<>*<>*<>  -   Keep up the [place] Work  !                 ( And Drink more fluids  !  )   <>*<>*<>*<>*<>*<>*<>*<>*<>*<>*<>*<>*<>*<>*<>*<>*<>*<>*<>*<>*<>*<>*<>*<>*<> <>*<>*<>*<>*<>*<>*<>*<>*<>*<>*<>*<>*<>*<>*<>*<>*<>*<>*<>*<>*<>*<>*<>*<>*<>

## 2022-10-18 ENCOUNTER — Encounter: Payer: Self-pay | Admitting: Physician Assistant

## 2022-11-02 ENCOUNTER — Ambulatory Visit: Payer: BC Managed Care – PPO | Admitting: Physician Assistant

## 2022-11-12 ENCOUNTER — Encounter: Payer: Self-pay | Admitting: Physician Assistant

## 2022-11-12 ENCOUNTER — Ambulatory Visit: Payer: BC Managed Care – PPO | Admitting: Physician Assistant

## 2022-11-12 DIAGNOSIS — Z79899 Other long term (current) drug therapy: Secondary | ICD-10-CM

## 2022-11-12 DIAGNOSIS — G4700 Insomnia, unspecified: Secondary | ICD-10-CM | POA: Diagnosis not present

## 2022-11-12 DIAGNOSIS — F319 Bipolar disorder, unspecified: Secondary | ICD-10-CM | POA: Diagnosis not present

## 2022-11-12 MED ORDER — LITHIUM CARBONATE ER 300 MG PO TBCR: EXTENDED_RELEASE_TABLET | ORAL | 1 refills | Status: DC

## 2022-11-12 NOTE — Progress Notes (Signed)
[facility]  Patient ID: Tanya Tucker,  MRN: [id number]  PCP: Lucky Cowboy, MD  Date of Evaluation: 11/12/2022 Time spent:30 minutes  Chief Complaint:  Chief Complaint   Follow-up    HISTORY/CURRENT STATUS: HPI routine 93-month med check.  Doing well except her husband is sick. See Fm/social history. Even with that stress, patient is able to enjoy things.  Energy and motivation are good.  Work is going well.   No extreme sadness, tearfulness, or feelings of hopelessness.  Sleeps well most of the time. ADLs and personal hygiene are normal.   Denies any changes in concentration, making decisions, or remembering things.  Appetite has not changed.  Weight is stable.   No anxiety for the most part. Denies suicidal or homicidal thoughts.  Patient denies increased energy with decreased need for sleep, increased talkativeness, racing thoughts, impulsivity or risky behaviors, increased spending, increased libido, grandiosity, increased irritability or anger, paranoia, or hallucinations.  Denies dizziness, syncope, seizures, numbness, tingling, tremor, tics, unsteady gait, slurred speech, confusion. Denies muscle or joint pain, stiffness, or dystonia. Denies unexplained weight loss, frequent infections, or sores that heal slowly.  No polyphagia, polydipsia, or polyuria. Denies visual changes or paresthesias.   Individual Medical History/ Review of Systems: Changes? :No     Past medications for mental health diagnoses include: Lithium, Seroquel  Allergies: Dexamethasone and Prednisolone  Current Medications:  Current Outpatient Medications:    aspirin EC 81 MG tablet, Take 1 tablet (81 mg total) by mouth daily. Swallow whole., Disp: 90 tablet, Rfl: 3   Cholecalciferol (VITAMIN D PO), Take 10,000 Units by mouth daily., Disp: , Rfl:    Coenzyme Q10 (CO Q 10) 100 MG CAPS, Take 1 capsule by mouth daily., Disp: , Rfl:    hydrochlorothiazide (MICROZIDE) 12.5 MG capsule, TAKE 1  CAPSULE BY MOUTH ONCE DAILY AS NEEDED FOR  FLUID  RETENTION, Disp: 90 capsule, Rfl: 3   lisinopril (ZESTRIL) 10 MG tablet, Take 1 tablet  Daily for BP                                                       /                                                      Take                                                   by                                               mouth                                    once  ? ? ? daily, Disp: 90 tablet, Rfl: 3   Magnesium 250 MG  TABS, Take 250 mg by mouth 2 (two) times daily., Disp: , Rfl:    Omega-3 Fatty Acids (FISH OIL) 1000 MG CAPS, Take 3,000 mg by mouth., Disp: , Rfl:    QUEtiapine (SEROQUEL) 50 MG tablet, Take 1 tablet (50 mg total) by mouth at bedtime as needed., Disp: 30 tablet, Rfl: 1   rosuvastatin (CRESTOR) 20 MG tablet, TAKE 1 TABLET BY MOUTH ONCE DAILY FOR CHOLESTEROL, Disp: 90 tablet, Rfl: 0   zinc gluconate 50 MG tablet, Take 50 mg by mouth daily., Disp: , Rfl:    lithium carbonate (LITHOBID) 300 MG ER tablet, TAKE 1 TABLET BY MOUTH IN THE MORNING AND 2 TABLETS AT BEDTIME, Disp: 270 tablet, Rfl: 1 Medication Side Effects: none  Family Medical/ Social History: Changes? Husband fx sacrum, ? Ca recurance?  MENTAL HEALTH EXAM:  Last menstrual period 04/02/2015.There is no height or weight on file to calculate BMI.  General Appearance: Casual, Neat and Well Groomed  Eye Contact:  Good  Speech:  Clear and Coherent and Normal Rate  Volume:  Normal  Mood:  Euthymic  Affect:  Appropriate  Thought Process:  Goal Directed and Descriptions of Associations: Circumstantial  Orientation:  Full (Time, Place, and Person)  Thought Content: Logical   Suicidal Thoughts:  No  Homicidal Thoughts:  No  Memory:  WNL  Judgement:  Good  Insight:  Good  Psychomotor Activity:  Normal  Concentration:  Concentration: Good and Attention Span: Good  Recall:  Good  Fund of Knowledge: Good  Language: Good  Assets:  Communication Skills Desire for  Improvement Financial Resources/Insurance Housing Physical Health Resilience Transportation Vocational/Educational  ADL's:  Intact  Cognition: WNL  Prognosis:  Good   Labs  10/11/2022 CBC nl Creatinine 1.08 Lipid nl Lithium 1.1 Vitamin D 88 TSH 2.18  DIAGNOSES:    ICD-10-CM   1. Bipolar I disorder (HCC)  F31.9     2. Insomnia, unspecified type  G47.00     3. Encounter for long-term (current) use of medications  Z79.899      Receiving Psychotherapy: Yes  Derrek Monaco, [facility]  RECOMMENDATIONS:  PDMP was reviewed.  No controlled substances.  I provided 30 minutes of face to face time during this encounter, including time spent before and after the visit in records review, medical decision making, counseling pertinent to today's visit, and charting.   She is doing well. No changes are needed.   Continue lithium 300 mg, 1 p.o. every morning and 2 p.o. nightly.   Continue Seroquel 50 mg, 1 p.o. nightly as needed sleep.  She rarely takes. Continue multivitamin, magnesium, co-Q10, vitamin D. Continue therapy with Ulice Bold, [facility], [place].  Return in 6 months.  Melony Overly, PA-C

## 2022-11-30 ENCOUNTER — Encounter: Payer: BC Managed Care – PPO | Admitting: Nurse Practitioner

## 2022-12-18 ENCOUNTER — Other Ambulatory Visit: Payer: Self-pay | Admitting: Nurse Practitioner

## 2022-12-18 DIAGNOSIS — E782 Mixed hyperlipidemia: Secondary | ICD-10-CM

## 2022-12-22 NOTE — Progress Notes (Unsigned)
Complete Physical  Assessment and Plan: Tanya Tucker was seen today for annual exam.  Diagnoses and all orders for this visit:  Encounter for general adult medical examination with abnormal findings  Due Annually Mammogram and PAP UTD through GYN Colonoscopy 2019 Dr. Loreta Ave f/u due 2029  Essential hypertension -     CBC with Differential/Platelet - continue medications: hydrochlorothiazide 12.5 mg every day, decrease lisinopril to 10 mg 1/2 tab daily due to low BP Follow up in 4 weeks to reevaluate - Continue DASH diet, exercise and monitor at home. Call if greater than 130/80.   Go to the ER if any chest pain, shortness of breath, nausea, dizziness, severe HA, changes vision/speech   Mixed hyperlipidemia -     COMPLETE METABOLIC PANEL WITH GFR -     Lipid panel -     TSH Continue low saturated fat diet, exercise and Rosuvastatin 20 mg QD  Abnormal glucose -     Hemoglobin A1c Continue diet and exercise  Overweight (BMI 25.0-29.9)  Continue diet and exercise, has lost 6 pounds  Bipolar 1 disorder, mixed, severe (HCC)  Continue to follow with psych Continue medications and monitor symptoms  Screening for blood or protein in urine -     Urinalysis, Routine w reflex microscopic -     Microalbumin / creatinine urine ratio  Screening for ischemic heart disease -     EKG 12-Lead  Screening for thyroid disorder - TSH  Screening for AAA - U/S ABD LTD Retroperitoneal  Vitamin D deficiency Continue Vit D supplementation to maintain value in therapeutic level of 60-100  -     VITAMIN D 25 Hydroxy (Vit-D Deficiency, Fractures)  Medication management -     Magnesium  First degree AV block on EKG - Monitor Blood pressure, pulse Go to the ER if any chest pain, shortness of breath, nausea, dizziness, severe HA, changes vision/speech    Discussed med's effects and SE's. Screening labs and tests as requested with regular follow-up as recommended. Over 40 minutes of exam,  counseling, chart review, and complex, high level critical decision making was performed this visit.  Future Appointments  Date Time Provider Department Center  02/01/2023  1:30 PM Rosalyn Gess, MD [facility] None  03/29/2023  4:00 PM Lucky Cowboy, MD [facility] None  05/13/2023  3:00 PM Melony Overly T, PA-C [facility] None  05/30/2023  4:00 PM Raynelle Dick, NP [facility] None  08/01/2023  4:00 PM Lucky Cowboy, MD [facility] None  12/26/2023  9:00 AM Raynelle Dick, NP [facility] None    HPI  62 y.o. female  presents for a complete physical and follow up for has Hypertension; Vitamin D deficiency; Insulin resistance; Mixed hyperlipidemia; Medication management; Overweight (BMI 25.0-29.9); Bipolar 1 disorder, mixed, severe (HCC); Former smoker; FHx: heart disease; Insomnia; and Atypical chest pain on their problem list..  She has no complaints today and is doing well.  She has already had her flu shot this year.    BMI is Body mass index is 27.9 kg/m., she has been working on diet and exercise.  Wt Readings from Last 3 Encounters:  12/23/22 174 lb 3.2 oz (79 kg)  10/11/22 175 lb 3.2 oz (79.5 kg)  10/01/22 176 lb 9.6 oz (80.1 kg)    Her blood pressure has been controlled at home currently on lisinopril 10 mg every day and hydrochlorothiazide 12.5 mg QD, today their BP is BP: 98/68  BP Readings from Last 3 Encounters:  12/23/22 98/68  10/11/22  104/60  10/01/22 122/78  She does workout. She denies chest pain, shortness of breath, dizziness.   She is on cholesterol medication, rosuvastatin 20 mg,  and denies myalgias. Her cholesterol is at goal. The cholesterol last visit was:   Lab Results  Component Value Date   CHOL 141 10/11/2022   HDL 48 (L) 10/11/2022   LDLCALC 71 10/11/2022   TRIG 136 10/11/2022   CHOLHDL 2.9 10/11/2022    She has been working on diet and exercise for abnormal glucose, she is on bASA, she is on ACE/ARB  Last A1C in the office was:  Lab Results   Component Value Date   HGBA1C 5.6 10/11/2022    Last GFR: Lab Results  Component Value Date   EGFR 59 (L) 10/11/2022    Patient is on Vitamin D supplement.   Lab Results  Component Value Date   VD25OH 88 10/11/2022      Current Medications:  Current Outpatient Medications on File Prior to Visit  Medication Sig Dispense Refill   aspirin EC 81 MG tablet Take 1 tablet (81 mg total) by mouth daily. Swallow whole. 90 tablet 3   Cholecalciferol (VITAMIN D PO) Take 10,000 Units by mouth daily.     Coenzyme Q10 (CO Q 10) 100 MG CAPS Take 1 capsule by mouth daily.     hydrochlorothiazide (MICROZIDE) 12.5 MG capsule TAKE 1 CAPSULE BY MOUTH ONCE DAILY AS NEEDED FOR  FLUID  RETENTION 90 capsule 3   lisinopril (ZESTRIL) 10 MG tablet Take 1 tablet  Daily for BP                                                       /                                                      Take                                                   by                                               mouth                                    once  ? ? ? daily 90 tablet 3   lithium carbonate (LITHOBID) 300 MG ER tablet TAKE 1 TABLET BY MOUTH IN THE MORNING AND 2 TABLETS AT BEDTIME 270 tablet 1   Magnesium 250 MG TABS Take 250 mg by mouth 2 (two) times daily.     Omega-3 Fatty Acids (FISH OIL) 1000 MG CAPS Take 3,000 mg by mouth.     rosuvastatin (CRESTOR) 20 MG tablet TAKE 1 TABLET BY MOUTH ONCE DAILY FOR CHOLESTEROL 90 tablet 0   zinc gluconate  50 MG tablet Take 50 mg by mouth daily.     QUEtiapine (SEROQUEL) 50 MG tablet Take 1 tablet (50 mg total) by mouth at bedtime as needed. (Patient not taking: Reported on 12/23/2022) 30 tablet 1   No current facility-administered medications on file prior to visit.   Allergies:  Allergies  Allergen Reactions   Dexamethasone Palpitations    INSOMNIA   Prednisolone Other (See Comments)    Causes a reverse reaction   Medical History:  She has Hypertension; Vitamin D deficiency;  Insulin resistance; Mixed hyperlipidemia; Medication management; Overweight (BMI 25.0-29.9); Bipolar 1 disorder, mixed, severe (HCC); Former smoker; FHx: heart disease; Insomnia; and Atypical chest pain on their problem list. Health Maintenance:   Immunization History  Administered Date(s) Administered   Influenza Inj Mdck Quad With Preservative 10/12/2017   Influenza Split 10/24/2012, 12/10/2013   Influenza, Seasonal, Injecte, Preservative Fre 12/24/2014   Influenza,inj,Quad PF,6+ Mos 11/26/2020   Influenza,inj,quad, With Preservative 03/04/2016   Influenza,trivalent, recombinat, inj, PF 12/04/2022   Influenza-Unspecified 11/21/2021   PFIZER(Purple Top)SARS-COV-2 Vaccination 04/20/2019, 05/11/2019   PPD Test 12/10/2013, 12/24/2014, 03/04/2016, 03/07/2017, 10/23/2018, 10/26/2019   Pneumococcal Polysaccharide-23 10/24/2012   Tdap 07/19/2007, 08/21/2019   Health Maintenance  Topic Date Due   HIV Screening  Never done   Hepatitis C Screening  Never done   Zoster Vaccines- Shingrix (1 of 2) Never done   COVID-19 Vaccine (3 - Pfizer risk series) 06/08/2019   Cervical Cancer Screening (HPV/Pap Cotest)  04/17/2023   MAMMOGRAM  03/30/2024   Colonoscopy  09/22/2027   DTaP/Tdap/Td (3 - Td or Tdap) 08/20/2029   INFLUENZA VACCINE  Completed   HPV VACCINES  Aged Out     Last Dental Exam:Dr. Yetta Barre 2024 Last Eye Exam:Dr. Emily Filbert 2024 Patient Care Team: Lucky Cowboy, MD as PCP - General (Internal Medicine) Nahser, Deloris Ping, MD as PCP - Cardiology (Cardiology)  Surgical History:  She has a past surgical history that includes Tonsillectomy and adenoidectomy. Family History:  Herfamily history includes Cataracts in her mother; Diabetes in her father; Heart disease in her father; Hypertension in her father; Osteoporosis in her mother. Social History:  She reports that she quit smoking about 6 years ago. Her smoking use included cigarettes. She has never used smokeless tobacco. She reports  current alcohol use of about 2.0 standard drinks of alcohol per week. She reports that she does not use drugs.  Review of Systems: Review of Systems  Constitutional:  Negative for chills and fever.  HENT:  Negative for congestion, hearing loss, sinus pain, sore throat and tinnitus.   Eyes:  Negative for blurred vision and double vision.  Respiratory:  Negative for cough, hemoptysis, sputum production, shortness of breath and wheezing.   Cardiovascular:  Negative for chest pain, palpitations and leg swelling.  Gastrointestinal:  Negative for abdominal pain, constipation, diarrhea, heartburn, nausea and vomiting.  Genitourinary:  Negative for dysuria and urgency.  Musculoskeletal:  Negative for back pain, falls, joint pain, myalgias and neck pain.  Skin:  Negative for rash.  Neurological:  Negative for dizziness, tingling, tremors, weakness and headaches.  Endo/Heme/Allergies:  Does not bruise/bleed easily.  Psychiatric/Behavioral:  Negative for depression and suicidal ideas. The patient is not nervous/anxious and does not have insomnia.     Physical Exam: Estimated body mass index is 27.9 kg/m as calculated from the following:   Height as of this encounter: 5' 6.25" (1.683 m).   Weight as of this encounter: 174 lb 3.2 oz (79 kg). BP 98/68  Pulse 60   Temp 97.9 F (36.6 C)   Ht 5' 6.25" (1.683 m)   Wt 174 lb 3.2 oz (79 kg)   LMP 04/02/2015 (Approximate)   SpO2 97%   BMI 27.90 kg/m  General Appearance: Well nourished, in no apparent distress.  Eyes: PERRLA, EOMs, conjunctiva no swelling or erythema, normal fundi and vessels.  Sinuses: No Frontal/maxillary tenderness  ENT/Mouth: Ext aud canals clear, normal light reflex with TMs without erythema, bulging. Good dentition. No erythema, swelling, or exudate on post pharynx. Tonsils not swollen or erythematous. Hearing normal.  Neck: Supple, thyroid normal. No bruits  Respiratory: Respiratory effort normal, BS equal bilaterally without  rales, rhonchi, wheezing or stridor.  Cardio: RRR without murmurs, rubs or gallops. Brisk peripheral pulses without edema.  Chest: symmetric, with normal excursions and percussion.  Breasts: Defer to GYN Abdomen: Positive bowel sounds all 4 quadrants, Soft, nontender, no guarding, rebound, hernias, masses, or organomegaly.  Lymphatics: Non tender without lymphadenopathy.  Genitourinary: Defer to GYN Musculoskeletal: Full ROM all peripheral extremities,5/5 strength, and normal gait.  Skin: Warm, dry without rashes, lesions, ecchymosis. Neuro: Cranial nerves intact, reflexes equal bilaterally. Normal muscle tone, no cerebellar symptoms. Sensation intact.  Psych: Awake and oriented X 3, normal affect, Insight and Judgment appropriate.   EKG: Sinus Bradycardia , AV block I AAA:   < 3cm  Bronislaw Switzer E  9:14 AM [facility]

## 2022-12-23 ENCOUNTER — Ambulatory Visit (INDEPENDENT_AMBULATORY_CARE_PROVIDER_SITE_OTHER): Payer: BC Managed Care – PPO | Admitting: Nurse Practitioner

## 2022-12-23 ENCOUNTER — Encounter: Payer: Self-pay | Admitting: Nurse Practitioner

## 2022-12-23 VITALS — BP 98/68 | HR 60 | Temp 97.9°F | Ht 66.25 in | Wt 174.2 lb

## 2022-12-23 DIAGNOSIS — Z79899 Other long term (current) drug therapy: Secondary | ICD-10-CM

## 2022-12-23 DIAGNOSIS — I700 Atherosclerosis of aorta: Secondary | ICD-10-CM

## 2022-12-23 DIAGNOSIS — Z136 Encounter for screening for cardiovascular disorders: Secondary | ICD-10-CM | POA: Diagnosis not present

## 2022-12-23 DIAGNOSIS — Z1389 Encounter for screening for other disorder: Secondary | ICD-10-CM

## 2022-12-23 DIAGNOSIS — Z13220 Encounter for screening for lipoid disorders: Secondary | ICD-10-CM

## 2022-12-23 DIAGNOSIS — R7309 Other abnormal glucose: Secondary | ICD-10-CM

## 2022-12-23 DIAGNOSIS — I10 Essential (primary) hypertension: Secondary | ICD-10-CM

## 2022-12-23 DIAGNOSIS — I440 Atrioventricular block, first degree: Secondary | ICD-10-CM

## 2022-12-23 DIAGNOSIS — E663 Overweight: Secondary | ICD-10-CM

## 2022-12-23 DIAGNOSIS — E782 Mixed hyperlipidemia: Secondary | ICD-10-CM

## 2022-12-23 DIAGNOSIS — F3163 Bipolar disorder, current episode mixed, severe, without psychotic features: Secondary | ICD-10-CM

## 2022-12-23 DIAGNOSIS — E559 Vitamin D deficiency, unspecified: Secondary | ICD-10-CM

## 2022-12-23 DIAGNOSIS — Z131 Encounter for screening for diabetes mellitus: Secondary | ICD-10-CM | POA: Diagnosis not present

## 2022-12-23 DIAGNOSIS — Z1329 Encounter for screening for other suspected endocrine disorder: Secondary | ICD-10-CM

## 2022-12-23 DIAGNOSIS — Z0000 Encounter for general adult medical examination without abnormal findings: Secondary | ICD-10-CM

## 2022-12-23 DIAGNOSIS — Z0001 Encounter for general adult medical examination with abnormal findings: Secondary | ICD-10-CM

## 2022-12-23 NOTE — Patient Instructions (Signed)

## 2022-12-24 LAB — CBC WITH DIFFERENTIAL/PLATELET
Absolute Lymphocytes: 1075 {cells}/uL (ref 850–3900)
Absolute Monocytes: 297 {cells}/uL (ref 200–950)
Basophils Absolute: 50 {cells}/uL (ref 0–200)
Basophils Relative: 0.9 %
Eosinophils Absolute: 207 {cells}/uL (ref 15–500)
Eosinophils Relative: 3.7 %
HCT: 37.9 % (ref 35.0–45.0)
Hemoglobin: 12.3 g/dL (ref 11.7–15.5)
MCH: 29.1 pg (ref 27.0–33.0)
MCHC: 32.5 g/dL (ref 32.0–36.0)
MCV: 89.6 fL (ref 80.0–100.0)
MPV: 10.8 fL (ref 7.5–12.5)
Monocytes Relative: 5.3 %
Neutro Abs: 3970 {cells}/uL (ref 1500–7800)
Neutrophils Relative %: 70.9 %
Platelets: 184 10*3/uL (ref 140–400)
RBC: 4.23 10*6/uL (ref 3.80–5.10)
RDW: 13 % (ref 11.0–15.0)
Total Lymphocyte: 19.2 %
WBC: 5.6 10*3/uL (ref 3.8–10.8)

## 2022-12-24 LAB — URINALYSIS, ROUTINE W REFLEX MICROSCOPIC
Bilirubin Urine: NEGATIVE
Glucose, UA: NEGATIVE
Hgb urine dipstick: NEGATIVE
Ketones, ur: NEGATIVE
Leukocytes,Ua: NEGATIVE
Nitrite: NEGATIVE
Protein, ur: NEGATIVE
Specific Gravity, Urine: 1.014 (ref 1.001–1.035)
pH: 7 (ref 5.0–8.0)

## 2022-12-24 LAB — VITAMIN D 25 HYDROXY (VIT D DEFICIENCY, FRACTURES): Vit D, 25-Hydroxy: 51 ng/mL (ref 30–100)

## 2022-12-24 LAB — LIPID PANEL
Cholesterol: 151 mg/dL (ref <200)
HDL: 48 mg/dL — ABNORMAL LOW (ref >=50)
LDL Cholesterol (Calc): 77 mg/dL
Non-HDL Cholesterol (Calc): 103 mg/dL (ref <130)
Total CHOL/HDL Ratio: 3.1 (calc) (ref <5.0)
Triglycerides: 163 mg/dL — ABNORMAL HIGH (ref <150)

## 2022-12-24 LAB — MICROALBUMIN / CREATININE URINE RATIO
Creatinine, Urine: 76 mg/dL (ref 20–275)
Microalb Creat Ratio: 4 mg/g{creat} (ref <30)
Microalb, Ur: 0.3 mg/dL

## 2022-12-24 LAB — COMPLETE METABOLIC PANEL WITH GFR
AG Ratio: 2.2 (calc) (ref 1.0–2.5)
ALT: 24 U/L (ref 6–29)
AST: 21 U/L (ref 10–35)
Albumin: 4.7 g/dL (ref 3.6–5.1)
Alkaline phosphatase (APISO): 60 U/L (ref 37–153)
BUN/Creatinine Ratio: 26 (calc) — ABNORMAL HIGH (ref 6–22)
BUN: 27 mg/dL — ABNORMAL HIGH (ref 7–25)
CO2: 30 mmol/L (ref 20–32)
Calcium: 10.6 mg/dL — ABNORMAL HIGH (ref 8.6–10.4)
Chloride: 102 mmol/L (ref 98–110)
Creat: 1.03 mg/dL (ref 0.50–1.05)
Globulin: 2.1 g/dL (ref 1.9–3.7)
Glucose, Bld: 91 mg/dL (ref 65–99)
Potassium: 4.4 mmol/L (ref 3.5–5.3)
Sodium: 138 mmol/L (ref 135–146)
Total Bilirubin: 0.8 mg/dL (ref 0.2–1.2)
Total Protein: 6.8 g/dL (ref 6.1–8.1)
eGFR: 62 mL/min/{1.73_m2} (ref >=60)

## 2022-12-24 LAB — HEMOGLOBIN A1C W/OUT EAG: Hgb A1c MFr Bld: 5.4 %{Hb} (ref <5.7)

## 2022-12-24 LAB — TSH: TSH: 2.92 m[IU]/L (ref 0.40–4.50)

## 2022-12-24 LAB — MAGNESIUM: Magnesium: 2.3 mg/dL (ref 1.5–2.5)

## 2022-12-25 ENCOUNTER — Encounter: Payer: Self-pay | Admitting: Nurse Practitioner

## 2022-12-30 ENCOUNTER — Encounter: Payer: Self-pay | Admitting: Cardiovascular Disease

## 2023-01-31 ENCOUNTER — Ambulatory Visit: Payer: BC Managed Care – PPO | Admitting: Nurse Practitioner

## 2023-01-31 DIAGNOSIS — H02403 Unspecified ptosis of bilateral eyelids: Secondary | ICD-10-CM | POA: Diagnosis not present

## 2023-01-31 DIAGNOSIS — H25813 Combined forms of age-related cataract, bilateral: Secondary | ICD-10-CM | POA: Diagnosis not present

## 2023-01-31 DIAGNOSIS — H52203 Unspecified astigmatism, bilateral: Secondary | ICD-10-CM | POA: Diagnosis not present
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
## 2023-02-01 ENCOUNTER — Ambulatory Visit (INDEPENDENT_AMBULATORY_CARE_PROVIDER_SITE_OTHER): Payer: BC Managed Care – PPO | Admitting: Obstetrics and Gynecology

## 2023-02-01 ENCOUNTER — Encounter: Payer: Self-pay | Admitting: Obstetrics and Gynecology

## 2023-02-01 VITALS — BP 128/68 | HR 66 | Ht 66.5 in | Wt 173.0 lb

## 2023-02-01 DIAGNOSIS — Z01419 Encounter for gynecological examination (general) (routine) without abnormal findings: Secondary | ICD-10-CM | POA: Insufficient documentation
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
## 2023-02-01 NOTE — Assessment & Plan Note (Signed)
 Cervical cancer screening performed according to ASCCP guidelines. Encouraged annual mammogram screening Colonoscopy UTD DXA UTD Labs and immunizations with her primary Encouraged safe sexual practices as indicated Encouraged healthy lifestyle practices with diet and exercise For patients under 50-60yo, I recommend 1200mg  calcium daily and 600IU of vitamin D daily.

## 2023-02-01 NOTE — Patient Instructions (Signed)
 For patients under 50-60yo, I recommend 1200mg  calcium  daily and 600IU of vitamin D daily. For patients over 60yo, I recommend 1200mg  calcium  daily and 800IU of vitamin D daily.  Health Maintenance, Female Adopting a healthy lifestyle and getting preventive care are important in promoting health and wellness. Ask your health care provider about: The right schedule for you to have regular tests and exams. Things you can do on your own to prevent diseases and keep yourself healthy. What should I know about diet, weight, and exercise? Eat a healthy diet  Eat a diet that includes plenty of vegetables, fruits, low-fat dairy products, and lean protein. Do not eat a lot of foods that are high in solid fats, added sugars, or sodium. Maintain a healthy weight Body mass index (BMI) is used to identify weight problems. It estimates body fat based on height and weight. Your health care provider can help determine your BMI and help you achieve or maintain a healthy weight. Get regular exercise Get regular exercise. This is one of the most important things you can do for your health. Most adults should: Exercise for at least 150 minutes each week. The exercise should increase your heart rate and make you sweat (moderate-intensity exercise). Do strengthening exercises at least twice a week. This is in addition to the moderate-intensity exercise. Spend less time sitting. Even light physical activity can be beneficial. Watch cholesterol and blood lipids Have your blood tested for lipids and cholesterol at 60 years of age, then have this test every 5 years. Have your cholesterol levels checked more often if: Your lipid or cholesterol levels are high. You are older than 60 years of age. You are at high risk for heart disease. What should I know about cancer screening? Depending on your health history and family history, you may need to have cancer screening at various ages. This may include screening  for: Breast cancer. Cervical cancer. Colorectal cancer. Skin cancer. Lung cancer. What should I know about heart disease, diabetes, and high blood pressure? Blood pressure and heart disease High blood pressure causes heart disease and increases the risk of stroke. This is more likely to develop in people who have high blood pressure readings or are overweight. Have your blood pressure checked: Every 3-5 years if you are 25-57 years of age. Every year if you are 24 years old or older. Diabetes Have regular diabetes screenings. This checks your fasting blood sugar level. Have the screening done: Once every three years after age 62 if you are at a normal weight and have a low risk for diabetes. More often and at a younger age if you are overweight or have a high risk for diabetes. What should I know about preventing infection? Hepatitis B If you have a higher risk for hepatitis B, you should be screened for this virus. Talk with your health care provider to find out if you are at risk for hepatitis B infection. Hepatitis C Testing is recommended for: Everyone born from [birth year] through 1963. Anyone with known risk factors for hepatitis C. Sexually transmitted infections (STIs) Get screened for STIs, including gonorrhea and chlamydia, if: You are sexually active and are younger than 60 years of age. You are older than 60 years of age and your health care provider tells you that you are at risk for this type of infection. Your sexual activity has changed since you were last screened, and you are at increased risk for chlamydia or gonorrhea. Ask your health care provider if  you are at risk. Ask your health care provider about whether you are at high risk for HIV. Your health care provider may recommend a prescription medicine to help prevent HIV infection. If you choose to take medicine to prevent HIV, you should first get tested for HIV. You should then be tested every 3 months for as long as you  are taking the medicine. Osteoporosis and menopause Osteoporosis is a disease in which the bones lose minerals and strength with aging. This can result in bone fractures. If you are 72 years old or older, or if you are at risk for osteoporosis and fractures, ask your health care provider if you should: Be screened for bone loss. Take a calcium  or vitamin D supplement to lower your risk of fractures. Be given hormone replacement therapy (HRT) to treat symptoms of menopause. Follow these instructions at home: Alcohol use Do not drink alcohol if: Your health care provider tells you not to drink. You are pregnant, may be pregnant, or are planning to become pregnant. If you drink alcohol: Limit how much you have to: 0-1 drink a day. Know how much alcohol is in your drink. In the [place] one drink equals one 12 oz bottle of beer (355 mL), one 5 oz glass of wine (148 mL), or one 1 oz glass of hard liquor (44 mL). Lifestyle Do not use any products that contain nicotine or tobacco. These products include cigarettes, chewing tobacco, and vaping devices, such as e-cigarettes. If you need help quitting, ask your health care provider. Do not use street drugs. Do not share needles. Ask your health care provider for help if you need support or information about quitting drugs. General instructions Schedule regular health, dental, and eye exams. Stay current with your vaccines. Tell your health care provider if: You often feel depressed. You have ever been abused or do not feel safe at home. Summary Adopting a healthy lifestyle and getting preventive care are important in promoting health and wellness. Follow your health care provider's instructions about healthy diet, exercising, and getting tested or screened for diseases. Follow your health care provider's instructions on monitoring your cholesterol and blood pressure. This information is not intended to replace advice given to you by your health  care provider. Make sure you discuss any questions you have with your health care provider. Document Revised: 06/09/2020 Document Reviewed: 06/09/2020 Elsevier Patient Education  2024 [employer].

## 2023-02-17 ENCOUNTER — Other Ambulatory Visit: Payer: Self-pay | Admitting: Nurse Practitioner

## 2023-02-17 ENCOUNTER — Telehealth: Payer: Self-pay | Admitting: Nurse Practitioner

## 2023-02-17 MED ORDER — LISINOPRIL 5 MG PO TABS: 5.0000 mg | ORAL_TABLET | Freq: Every day | ORAL | 3 refills | Status: AC

## 2023-02-17 NOTE — Telephone Encounter (Signed)
Patient said you made changes to her Lisinopril. You changed the MG from 10 MG to now 5 MG. She said you asked her to just cut the pills in half. She is wondering if you can send a new prescription of Lisinopril 5 MG to [facility] [address] [place] - [address]

## 2023-03-03 ENCOUNTER — Ambulatory Visit: Payer: BC Managed Care – PPO | Admitting: Nurse Practitioner

## 2023-03-10 ENCOUNTER — Ambulatory Visit: Payer: BC Managed Care – PPO | Admitting: Nurse Practitioner

## 2023-03-15 ENCOUNTER — Ambulatory Visit: Payer: BC Managed Care – PPO | Admitting: Nurse Practitioner

## 2023-03-23 ENCOUNTER — Other Ambulatory Visit: Payer: Self-pay

## 2023-03-23 DIAGNOSIS — E782 Mixed hyperlipidemia: Secondary | ICD-10-CM

## 2023-03-23 MED ORDER — ROSUVASTATIN CALCIUM 20 MG PO TABS: ORAL_TABLET | ORAL | 0 refills | Status: AC

## 2023-03-29 ENCOUNTER — Ambulatory Visit: Payer: BC Managed Care – PPO | Admitting: Internal Medicine

## 2023-05-10 DIAGNOSIS — F3163 Bipolar disorder, current episode mixed, severe, without psychotic features: Secondary | ICD-10-CM | POA: Diagnosis not present

## 2023-05-10 DIAGNOSIS — I10 Essential (primary) hypertension: Secondary | ICD-10-CM | POA: Diagnosis not present

## 2023-05-10 DIAGNOSIS — E782 Mixed hyperlipidemia: Secondary | ICD-10-CM | POA: Diagnosis not present

## 2023-05-10 DIAGNOSIS — Z0000 Encounter for general adult medical examination without abnormal findings: Secondary | ICD-10-CM | POA: Diagnosis not present

## 2023-05-10 DIAGNOSIS — Z1330 Encounter for screening examination for mental health and behavioral disorders, unspecified: Secondary | ICD-10-CM | POA: Diagnosis not present

## 2023-05-11 DIAGNOSIS — E559 Vitamin D deficiency, unspecified: Secondary | ICD-10-CM | POA: Diagnosis not present

## 2023-05-11 DIAGNOSIS — E782 Mixed hyperlipidemia: Secondary | ICD-10-CM | POA: Diagnosis not present

## 2023-05-11 DIAGNOSIS — F3163 Bipolar disorder, current episode mixed, severe, without psychotic features: Secondary | ICD-10-CM | POA: Diagnosis not present

## 2023-05-11 DIAGNOSIS — I10 Essential (primary) hypertension: Secondary | ICD-10-CM | POA: Diagnosis not present

## 2023-05-13 ENCOUNTER — Ambulatory Visit: Payer: BC Managed Care – PPO | Admitting: Physician Assistant

## 2023-05-13 ENCOUNTER — Encounter: Payer: Self-pay | Admitting: Physician Assistant

## 2023-05-13 DIAGNOSIS — G4700 Insomnia, unspecified: Secondary | ICD-10-CM | POA: Diagnosis not present

## 2023-05-13 DIAGNOSIS — F4321 Adjustment disorder with depressed mood: Secondary | ICD-10-CM

## 2023-05-13 DIAGNOSIS — F319 Bipolar disorder, unspecified: Secondary | ICD-10-CM | POA: Diagnosis not present

## 2023-05-13 DIAGNOSIS — Z79899 Other long term (current) drug therapy: Secondary | ICD-10-CM

## 2023-05-13 MED ORDER — LITHIUM CARBONATE ER 300 MG PO TBCR: EXTENDED_RELEASE_TABLET | ORAL | 1 refills | Status: DC

## 2023-05-13 NOTE — Progress Notes (Signed)
 [facility]  Patient ID: Tanya Tucker,  MRN: [id number]  PCP: Vangie Genet, MD  Date of Evaluation: 05/13/2023 Time spent:35 minutes  Chief Complaint:  Chief Complaint   Depression; Follow-up    HISTORY/CURRENT STATUS: HPI routine 85-month med check.  Her husband died in Jan 25, 2024.  Of course she's sad, but handling it pretty well. She's enjoying her grandson and she rest of the family.  She and her dtr are going to [place] this summer. Looking forward to that.  Work is going well.  Energy and motivation are good.  No feelings of hopelessness.  Sleeps well most of the time. ADLs and personal hygiene are normal.   Denies any changes in concentration, making decisions, or remembering things.  Appetite has not changed.  Weight is stable.  No c/o anxiety.  Denies suicidal or homicidal thoughts.  Patient denies increased energy with decreased need for sleep, increased talkativeness, racing thoughts, impulsivity or risky behaviors, increased spending, increased libido, grandiosity, increased irritability or anger, paranoia, or hallucinations.  Denies dizziness, syncope, seizures, numbness, tingling, tremor, tics, unsteady gait, slurred speech, confusion. Denies muscle or joint pain, stiffness, or dystonia.  Individual Medical History/ Review of Systems: Changes? :No     Past medications for mental health diagnoses include: Lithium, Seroquel  Allergies: Dexamethasone and Prednisolone  Current Medications:  Current Outpatient Medications:    aspirin EC 81 MG tablet, Take 1 tablet (81 mg total) by mouth daily. Swallow whole., Disp: 90 tablet, Rfl: 3   Cholecalciferol (VITAMIN D PO), Take 10,000 Units by mouth daily., Disp: , Rfl:    Coenzyme Q10 (CO Q 10) 100 MG CAPS, Take 1 capsule by mouth daily., Disp: , Rfl:    hydrochlorothiazide (MICROZIDE) 12.5 MG capsule, TAKE 1 CAPSULE BY MOUTH ONCE DAILY AS NEEDED FOR  FLUID  RETENTION, Disp: 90 capsule, Rfl: 3   lisinopril (ZESTRIL)  5 MG tablet, Take 1 tablet (5 mg total) by mouth daily., Disp: 90 tablet, Rfl: 3   Magnesium 250 MG TABS, Take 250 mg by mouth 2 (two) times daily., Disp: , Rfl:    Omega-3 Fatty Acids (FISH OIL) 1000 MG CAPS, Take 3,000 mg by mouth., Disp: , Rfl:    QUEtiapine (SEROQUEL) 50 MG tablet, Take 1 tablet (50 mg total) by mouth at bedtime as needed., Disp: 30 tablet, Rfl: 1   rosuvastatin (CRESTOR) 20 MG tablet, TAKE 1 TABLET BY MOUTH ONCE DAILY FOR CHOLESTEROL, Disp: 90 tablet, Rfl: 0   zinc gluconate 50 MG tablet, Take 50 mg by mouth daily., Disp: , Rfl:    lithium carbonate (LITHOBID) 300 MG ER tablet, TAKE 1 TABLET BY MOUTH IN THE MORNING AND 2 TABLETS AT BEDTIME, Disp: 270 tablet, Rfl: 1 Medication Side Effects: none  Family Medical/ Social History: Changes? Husband fx sacrum, ? Ca recurance?  MENTAL HEALTH EXAM:  Last menstrual period 04/02/2015.There is no height or weight on file to calculate BMI.  General Appearance: Casual, Neat and Well Groomed  Eye Contact:  Good  Speech:  Clear and Coherent and Normal Rate  Volume:  Normal  Mood:  Euthymic  Affect:  Appropriate  Thought Process:  Goal Directed and Descriptions of Associations: Circumstantial  Orientation:  Full (Time, Place, and Person)  Thought Content: Logical   Suicidal Thoughts:  No  Homicidal Thoughts:  No  Memory:  WNL  Judgement:  Good  Insight:  Good  Psychomotor Activity:  Normal  Concentration:  Concentration: Good and Attention Span: Good  Recall:  Good  Fund of Knowledge: Good  Language: Good  Assets:  Communication Skills Desire for Improvement Financial Resources/Insurance Housing Physical Health Resilience Transportation Vocational/Educational  ADL's:  Intact  Cognition: WNL  Prognosis:  Good   Labs reviewed, no recent Lithium level  DIAGNOSES:    ICD-10-CM   1. Bipolar I disorder (HCC)  F31.9     2. Insomnia, unspecified type  G47.00     3. Encounter for long-term (current) use of  medications  Z79.899      Receiving Psychotherapy: Yes  Jacqui Mau, [facility]  RECOMMENDATIONS:  PDMP was reviewed.  No controlled substances.  I provided 35 minutes of face to face time during this encounter, including time spent before and after the visit in records review, medical decision making, counseling pertinent to today's visit, and charting.   My condolences in the loss of her husband.   Continue lithium 300 mg, 1 p.o. every morning and 2 p.o. nightly.   Continue Seroquel 50 mg, 1 p.o. nightly as needed sleep.  She rarely takes. Continue multivitamin, magnesium, co-Q10, vitamin D. Written order given for lithium level.  She will take to her PCP. Continue therapy with Carson Sarvis, L[facility].  Return in 6 months.  Marvia Slocumb, PA-C

## 2023-05-30 ENCOUNTER — Ambulatory Visit: Payer: BC Managed Care – PPO | Admitting: Nurse Practitioner

## 2023-07-01 ENCOUNTER — Other Ambulatory Visit: Payer: Self-pay | Admitting: Physician Assistant

## 2023-07-01 DIAGNOSIS — Z79899 Other long term (current) drug therapy: Secondary | ICD-10-CM | POA: Diagnosis not present

## 2023-07-01 DIAGNOSIS — F319 Bipolar disorder, unspecified: Secondary | ICD-10-CM | POA: Diagnosis not present

## 2023-07-02 LAB — LITHIUM LEVEL: Lithium Lvl: 1 mmol/L (ref 0.5–1.2)

## 2023-07-04 ENCOUNTER — Ambulatory Visit: Payer: Self-pay | Admitting: Physician Assistant

## 2023-07-27 ENCOUNTER — Other Ambulatory Visit: Payer: Self-pay | Admitting: Family

## 2023-07-27 DIAGNOSIS — E782 Mixed hyperlipidemia: Secondary | ICD-10-CM

## 2023-08-01 ENCOUNTER — Ambulatory Visit: Payer: BC Managed Care – PPO | Admitting: Internal Medicine

## 2023-09-19 ENCOUNTER — Telehealth: Payer: Self-pay | Admitting: Physician Assistant

## 2023-09-19 ENCOUNTER — Other Ambulatory Visit: Payer: Self-pay | Admitting: Physician Assistant

## 2023-09-19 DIAGNOSIS — Z1231 Encounter for screening mammogram for malignant neoplasm of breast: Secondary | ICD-10-CM

## 2023-09-19 DIAGNOSIS — I10 Essential (primary) hypertension: Secondary | ICD-10-CM | POA: Diagnosis not present

## 2023-09-19 DIAGNOSIS — E782 Mixed hyperlipidemia: Secondary | ICD-10-CM | POA: Diagnosis not present

## 2023-09-19 NOTE — Telephone Encounter (Signed)
 Pt states she is losing her ins on 8/30. And is wanting a refill on her Lithium  before then so ins will pay for it. Advised pt she can get an uninsured discount and that Verneita could span out her visits every few months. Pt thinks she has a refill on Seroquel  already.    [place] [address] [employer]

## 2023-09-19 NOTE — Telephone Encounter (Signed)
 error

## 2023-09-19 NOTE — Telephone Encounter (Signed)
 Pt has RF available. Had pharmacy run it and it can be filled now for $12. Patient notified.

## 2023-09-20 ENCOUNTER — Inpatient Hospital Stay
Admission: RE | Admit: 2023-09-20 | Discharge: 2023-09-20 | Discharge status: Home / Self Care | Source: Ambulatory Visit | Attending: Physician Assistant

## 2023-09-20 DIAGNOSIS — Z1231 Encounter for screening mammogram for malignant neoplasm of breast: Secondary | ICD-10-CM | POA: Diagnosis not present
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
## 2023-09-22 ENCOUNTER — Ambulatory Visit: Attending: Cardiology | Admitting: Cardiology

## 2023-09-22 ENCOUNTER — Encounter: Payer: Self-pay | Admitting: Cardiology

## 2023-09-22 VITALS — BP 110/68 | HR 62 | Ht 66.0 in | Wt 175.5 lb

## 2023-09-22 DIAGNOSIS — I10 Essential (primary) hypertension: Secondary | ICD-10-CM | POA: Diagnosis not present

## 2023-09-22 DIAGNOSIS — R079 Chest pain, unspecified: Secondary | ICD-10-CM | POA: Diagnosis not present

## 2023-09-22 DIAGNOSIS — E782 Mixed hyperlipidemia: Secondary | ICD-10-CM

## 2023-09-22 DIAGNOSIS — R002 Palpitations: Secondary | ICD-10-CM

## 2023-09-22 LAB — LIPID PANEL

## 2023-09-22 NOTE — Patient Instructions (Signed)
 Medication Instructions:  Continue current medication *If you need a refill on your cardiac medications before your next appointment, please call your pharmacy*  Lab Work: Bmet, mg, lipid today If you have labs (blood work) drawn today and your tests are completely normal, you will receive your results only by: MyChart Message (if you have MyChart) OR A paper copy in the mail If you have any lab test that is abnormal or we need to change your treatment, we will call you to review the results.  Testing/Procedures: Dr. KATE has ordered a CT coronary calcium  score.   Test locations:  [facility] at [facility] [place]  [facility] at [facility]  This is $99 out of pocket.   Coronary CalciumScan A coronary calcium  scan is an imaging test used to look for deposits of calcium  and other fatty materials (plaques) in the inner lining of the blood vessels of the heart (coronary arteries). These deposits of calcium  and plaques can partly clog and narrow the coronary arteries without producing any symptoms or warning signs. This puts a person at risk for a heart attack. This test can detect these deposits before symptoms develop. Tell a health care provider about: Any allergies you have. All medicines you are taking, including vitamins, herbs, eye drops, creams, and over-the-counter medicines. Any problems you or family members have had with anesthetic medicines. Any blood disorders you have. Any surgeries you have had. Any medical conditions you have. Whether you are pregnant or may be pregnant. What are the risks? Generally, this is a safe procedure. However, problems may occur, including: Harm to a pregnant woman and her unborn baby. This test involves the use of radiation. Radiation exposure can be dangerous to a pregnant woman and her unborn baby. If you are pregnant, you generally should  not have this procedure done. Slight increase in the risk of cancer. This is because of the radiation involved in the test. What happens before the procedure? No preparation is needed for this procedure. What happens during the procedure? You will undress and remove any jewelry around your neck or chest. You will put on a hospital gown. Sticky electrodes will be placed on your chest. The electrodes will be connected to an electrocardiogram (ECG) machine to record a tracing of the electrical activity of your heart. A CT scanner will take pictures of your heart. During this time, you will be asked to lie still and hold your breath for 2-3 seconds while a picture of your heart is being taken. The procedure may vary among health care providers and hospitals. What happens after the procedure? You can get dressed. You can return to your normal activities. It is up to you to get the results of your test. Ask your health care provider, or the department that is doing the test, when your results will be ready. Summary A coronary calcium  scan is an imaging test used to look for deposits of calcium  and other fatty materials (plaques) in the inner lining of the blood vessels of the heart (coronary arteries). Generally, this is a safe procedure. Tell your health care provider if you are pregnant or may be pregnant. No preparation is needed for this procedure. A CT scanner will take pictures of your heart. You can return to your normal activities after the scan is done. This information is not intended to replace advice given to you by your health care provider. Make sure you  discuss any questions you have with your health care provider. Document Released: 07/17/2007 Document Revised: 12/08/2015 Document Reviewed: 12/08/2015 Elsevier Interactive Patient Education  2017 [employer].   Follow-Up: At [facility], you and your health needs are our priority.  As part of our continuing mission to  provide you with exceptional heart care, our providers are all part of one team.  This team includes your primary Cardiologist (physician) and Advanced Practice Providers or APPs (Physician Assistants and Nurse Practitioners) who all work together to provide you with the care you need, when you need it.  Your next appointment:   1 year(s)  Provider:   Dr. Kate  We recommend signing up for the patient portal called MyChart.  Sign up information is provided on this After Visit Summary.  MyChart is used to connect with patients for Virtual Visits (Telemedicine).  Patients are able to view lab/test results, encounter notes, upcoming appointments, etc.  Non-urgent messages can be sent to your provider as well.   To learn more about what you can do with MyChart, go to [email or web address].   Other Instructions none

## 2023-09-23 LAB — BASIC METABOLIC PANEL WITH GFR
BUN/Creatinine Ratio: 24 (ref 12–28)
BUN: 25 mg/dL (ref 8–27)
CO2: 23 mmol/L (ref 20–29)
Calcium: 10.4 mg/dL — ABNORMAL HIGH (ref 8.7–10.3)
Chloride: 103 mmol/L (ref 96–106)
Creatinine, Ser: 1.04 mg/dL — ABNORMAL HIGH (ref 0.57–1.00)
Glucose: 73 mg/dL (ref 70–99)
Potassium: 4.5 mmol/L (ref 3.5–5.2)
Sodium: 139 mmol/L (ref 134–144)
eGFR: 62 mL/min/1.73 (ref >59)

## 2023-09-23 LAB — LIPID PANEL
Cholesterol, Total: 120 mg/dL (ref 100–199)
HDL: 50 mg/dL (ref >39)
LDL CALC COMMENT:: 2.4 ratio (ref 0.0–4.4)
LDL Chol Calc (NIH): 51 mg/dL (ref 0–99)
Triglycerides: 101 mg/dL (ref 0–149)
VLDL Cholesterol Cal: 19 mg/dL (ref 5–40)

## 2023-09-23 LAB — MAGNESIUM: Magnesium: 2.1 mg/dL (ref 1.6–2.3)

## 2023-09-25 ENCOUNTER — Ambulatory Visit: Payer: Self-pay | Admitting: Cardiology

## 2023-09-27 ENCOUNTER — Encounter: Payer: Self-pay | Admitting: Physician Assistant

## 2023-09-27 ENCOUNTER — Ambulatory Visit: Admitting: Physician Assistant

## 2023-09-27 DIAGNOSIS — G4700 Insomnia, unspecified: Secondary | ICD-10-CM

## 2023-09-27 DIAGNOSIS — F4321 Adjustment disorder with depressed mood: Secondary | ICD-10-CM | POA: Diagnosis not present

## 2023-09-27 DIAGNOSIS — H25813 Combined forms of age-related cataract, bilateral: Secondary | ICD-10-CM | POA: Diagnosis not present

## 2023-09-27 DIAGNOSIS — Z789 Other specified health status: Secondary | ICD-10-CM

## 2023-09-27 DIAGNOSIS — F319 Bipolar disorder, unspecified: Secondary | ICD-10-CM

## 2023-09-27 DIAGNOSIS — Z560 Unemployment, unspecified: Secondary | ICD-10-CM

## 2023-09-27 DIAGNOSIS — H52203 Unspecified astigmatism, bilateral: Secondary | ICD-10-CM | POA: Diagnosis not present

## 2023-09-27 MED ORDER — LITHIUM CARBONATE ER 300 MG PO TBCR: EXTENDED_RELEASE_TABLET | ORAL | 1 refills | Status: AC

## 2023-09-27 MED ORDER — QUETIAPINE FUMARATE 50 MG PO TABS: 50.0000 mg | ORAL_TABLET | Freq: Every evening | ORAL | 1 refills | Status: AC | PRN

## 2023-09-27 NOTE — Progress Notes (Unsigned)
 [facility]  Patient ID: Tanya Tucker,  MRN: [id number]  PCP: Samie Frederick, PA-C  Date of Evaluation: 09/27/2023 Time spent:30 minutes  Chief Complaint:  Chief Complaint   Follow-up    HISTORY/CURRENT STATUS: HPI having major life changes.  She lost her job last week.  Not sleeping very well since.  Seroquel  helps when needed.  She's still grieving the loss of her husband last winter.  And her mother-in-law died yesterday. Just a lot going on.    She feels like her medications are still working.  She enjoys spending time with her family.  Energy and motivation are good.  No extreme sadness, tearfulness, or feelings of hopelessness.  ADLs and personal hygiene are normal.   Denies any changes in concentration, making decisions, or remembering things.  Appetite has not changed.  Weight is stable.  Anxiety seems normal in keeping with the circumstances.  No mania, delirium, AH/VH.  No SI/HI.  Review of Systems  Psychiatric/Behavioral:         See HPI  All other systems reviewed and are negative.  Individual Medical History/ Review of Systems: Changes? :No     Past medications for mental health diagnoses include: Lithium , Seroquel   Allergies: Dexamethasone  and Prednisolone  Current Medications:  Current Outpatient Medications:    aspirin  EC 81 MG tablet, Take 1 tablet (81 mg total) by mouth daily. Swallow whole., Disp: 90 tablet, Rfl: 3   Cholecalciferol (VITAMIN D  PO), Take 10,000 Units by mouth daily., Disp: , Rfl:    Coenzyme Q10 (CO Q 10) 100 MG CAPS, Take 1 capsule by mouth daily., Disp: , Rfl:    hydrochlorothiazide  (MICROZIDE ) 12.5 MG capsule, TAKE 1 CAPSULE BY MOUTH ONCE DAILY AS NEEDED FOR  FLUID  RETENTION, Disp: 90 capsule, Rfl: 3   lisinopril  (ZESTRIL ) 5 MG tablet, Take 1 tablet (5 mg total) by mouth daily., Disp: 90 tablet, Rfl: 3   lithium  carbonate (LITHOBID ) 300 MG ER tablet, TAKE 1 TABLET BY MOUTH IN THE MORNING AND 2 TABLETS AT BEDTIME, Disp: 270  tablet, Rfl: 1   Magnesium  250 MG TABS, Take 250 mg by mouth 2 (two) times daily., Disp: , Rfl:    Omega-3 Fatty Acids (FISH OIL) 1000 MG CAPS, Take 3,000 mg by mouth., Disp: , Rfl:    QUEtiapine  (SEROQUEL ) 50 MG tablet, Take 1 tablet (50 mg total) by mouth at bedtime as needed., Disp: 30 tablet, Rfl: 1   rosuvastatin  (CRESTOR ) 20 MG tablet, TAKE 1 TABLET BY MOUTH ONCE DAILY FOR CHOLESTEROL, Disp: 90 tablet, Rfl: 0   zinc  gluconate 50 MG tablet, Take 50 mg by mouth daily., Disp: , Rfl:  Medication Side Effects: none  Family Medical/ Social History: Changes? See HPI  MENTAL HEALTH EXAM:  Last menstrual period 04/02/2015.There is no height or weight on file to calculate BMI.  General Appearance: Casual, Neat and Well Groomed  Eye Contact:  Good  Speech:  Clear and Coherent and Normal Rate  Volume:  Normal  Mood:  sad when talking about her job  Affect:  Appropriate  Thought Process:  Goal Directed and Descriptions of Associations: Circumstantial  Orientation:  Full (Time, Place, and Person)  Thought Content: Logical   Suicidal Thoughts:  No  Homicidal Thoughts:  No  Memory:  WNL  Judgement:  Good  Insight:  Good  Psychomotor Activity:  Normal  Concentration:  Concentration: Good and Attention Span: Good  Recall:  Good  Fund of Knowledge: Good  Language: Good  Assets:  Communication Skills Desire for Improvement Financial Resources/Insurance Housing Physical Health Resilience Transportation  ADL's:  Intact  Cognition: WNL  Prognosis:  Good   Labs 07/01/2023 Lithium  level 1.0  09/22/2023 BMP creatinine 1.04, calcium  10.4 both values are stable.  Otherwise normal results. Magnesium  level 2.1 Lipid panel total cholesterol 120, triglycerides 101, HDL 50, LDL 51  DIAGNOSES:    ICD-10-CM   1. Bipolar I disorder (HCC)  F31.9     2. Grief  F43.21     3. Insomnia, unspecified type  G47.00     4. History of recent change in lifestyle  Z78.9     5. Loss of job  Z56.0       Receiving Psychotherapy: Yes  Johnetta Bers, [facility]  RECOMMENDATIONS:  PDMP was reviewed.  No controlled substances.  I provided approximately 30 minutes of face to face time during this encounter, including time spent before and after the visit in records review, medical decision making, counseling pertinent to today's visit, and charting.   I am sorry to hear about the loss of her job.  She thinks she will have a severance package for approximately 4 months so hopefully she will be able to find a fun job as she wants within that time frame.  Unfortunately she will not have health insurance unless she goes on the market place which she may do.  Sleep hygiene discussed.  She can continue the Seroquel  as needed or if preferred we can send in trazodone .  We discussed the pros and cons of this medication and she accepts.  Continue lithium  300 mg, 1 p.o. every morning and 2 p.o. nightly.   Continue Seroquel  50 mg, 1 p.o. nightly as needed sleep.  She rarely takes. Continue multivitamin, magnesium , co-Q10, vitamin D . Continue therapy with Johnetta Bers, [facility].  Return in 6 months.  Verneita Cooks, PA-C

## 2023-10-11 ENCOUNTER — Other Ambulatory Visit (HOSPITAL_BASED_OUTPATIENT_CLINIC_OR_DEPARTMENT_OTHER)

## 2023-11-14 ENCOUNTER — Ambulatory Visit: Admitting: Physician Assistant

## 2023-12-26 ENCOUNTER — Encounter: Payer: BC Managed Care – PPO | Admitting: Nurse Practitioner

## 2024-02-07 ENCOUNTER — Ambulatory Visit: Admitting: Obstetrics and Gynecology

## 2024-02-07 ENCOUNTER — Other Ambulatory Visit (HOSPITAL_COMMUNITY)
Admission: RE | Admit: 2024-02-07 | Discharge: 2024-02-07 | Disposition: A | Discharge status: Home / Self Care | Source: Ambulatory Visit | Attending: Obstetrics and Gynecology | Admitting: Obstetrics and Gynecology

## 2024-02-07 ENCOUNTER — Encounter: Payer: Self-pay | Admitting: Obstetrics and Gynecology

## 2024-02-07 VITALS — BP 126/84 | HR 78 | Ht 67.5 in | Wt 180.0 lb

## 2024-02-07 DIAGNOSIS — Z01419 Encounter for gynecological examination (general) (routine) without abnormal findings: Secondary | ICD-10-CM | POA: Diagnosis not present

## 2024-02-07 DIAGNOSIS — K644 Residual hemorrhoidal skin tags: Secondary | ICD-10-CM | POA: Insufficient documentation

## 2024-02-07 DIAGNOSIS — Z124 Encounter for screening for malignant neoplasm of cervix: Secondary | ICD-10-CM
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
## 2024-02-07 NOTE — Assessment & Plan Note (Signed)
 Desires surgical management, will refer

## 2024-02-07 NOTE — Patient Instructions (Signed)

## 2024-02-07 NOTE — Assessment & Plan Note (Signed)
 Cervical cancer screening performed according to ASCCP guidelines. Encouraged annual mammogram screening Colonoscopy UTD DXA UTD Labs and immunizations with her primary Encouraged safe sexual practices as indicated Encouraged healthy lifestyle practices with diet and exercise For patients under 50-62yo, I recommend 1200mg  calcium daily and 600IU of vitamin D daily.

## 2024-02-09 ENCOUNTER — Ambulatory Visit (HOSPITAL_BASED_OUTPATIENT_CLINIC_OR_DEPARTMENT_OTHER): Payer: Self-pay | Admitting: Obstetrics and Gynecology

## 2024-02-09 LAB — CYTOLOGY - PAP
Comment: NEGATIVE
Diagnosis: NEGATIVE
High risk HPV: NEGATIVE

## 2024-03-29 ENCOUNTER — Ambulatory Visit: Admitting: Physician Assistant

## 2025-02-07 ENCOUNTER — Ambulatory Visit: Admitting: Obstetrics and Gynecology
